# Patient Record
Sex: Male | Born: 1950 | Race: White | Hispanic: No | Smoking: Former smoker
Health system: Southern US, Community
[De-identification: ages and names within clinical notes are randomized; demographics above are authoritative.]

## PROBLEM LIST (undated history)

## (undated) DIAGNOSIS — E119 Type 2 diabetes mellitus without complications: Secondary | ICD-10-CM

## (undated) DIAGNOSIS — IMO0002 Reserved for concepts with insufficient information to code with codable children: Secondary | ICD-10-CM

## (undated) DIAGNOSIS — G4730 Sleep apnea, unspecified: Secondary | ICD-10-CM

## (undated) DIAGNOSIS — M519 Unspecified thoracic, thoracolumbar and lumbosacral intervertebral disc disorder: Secondary | ICD-10-CM

## (undated) DIAGNOSIS — M1990 Unspecified osteoarthritis, unspecified site: Secondary | ICD-10-CM

## (undated) DIAGNOSIS — E785 Hyperlipidemia, unspecified: Secondary | ICD-10-CM

## (undated) DIAGNOSIS — K219 Gastro-esophageal reflux disease without esophagitis: Secondary | ICD-10-CM

## (undated) DIAGNOSIS — T7840XA Allergy, unspecified, initial encounter: Secondary | ICD-10-CM

## (undated) DIAGNOSIS — C4490 Unspecified malignant neoplasm of skin, unspecified: Secondary | ICD-10-CM

## (undated) HISTORY — DX: Reserved for concepts with insufficient information to code with codable children: IMO0002

## (undated) HISTORY — DX: Unspecified malignant neoplasm of skin, unspecified: C44.90

## (undated) HISTORY — PX: COLONOSCOPY: SHX174

## (undated) HISTORY — DX: Type 2 diabetes mellitus without complications: E11.9

## (undated) HISTORY — DX: Allergy, unspecified, initial encounter: T78.40XA

## (undated) HISTORY — PX: UPPER GI ENDOSCOPY: SHX6162

## (undated) HISTORY — DX: Unspecified osteoarthritis, unspecified site: M19.90

## (undated) HISTORY — DX: Sleep apnea, unspecified: G47.30

## (undated) HISTORY — DX: Gastro-esophageal reflux disease without esophagitis: K21.9

## (undated) HISTORY — DX: Unspecified thoracic, thoracolumbar and lumbosacral intervertebral disc disorder: M51.9

## (undated) HISTORY — PX: APPENDECTOMY: SHX54

## (undated) HISTORY — PX: ORBITAL FRACTURE SURGERY: SHX725

## (undated) HISTORY — PX: FRACTURE SURGERY: SHX138

## (undated) HISTORY — DX: Hyperlipidemia, unspecified: E78.5

## (undated) HISTORY — PX: HERNIA REPAIR: SHX51

---

## 2004-12-21 ENCOUNTER — Ambulatory Visit (HOSPITAL_COMMUNITY): Admission: RE | Admit: 2004-12-21 | Discharge: 2004-12-21 | Payer: Self-pay | Admitting: Internal Medicine

## 2004-12-21 ENCOUNTER — Ambulatory Visit: Payer: Self-pay | Admitting: Internal Medicine

## 2005-04-01 ENCOUNTER — Encounter (INDEPENDENT_AMBULATORY_CARE_PROVIDER_SITE_OTHER): Payer: Self-pay | Admitting: Specialist

## 2005-04-01 ENCOUNTER — Inpatient Hospital Stay (HOSPITAL_COMMUNITY): Admission: EM | Admit: 2005-04-01 | Discharge: 2005-04-03 | Payer: Self-pay | Admitting: Emergency Medicine

## 2005-04-02 ENCOUNTER — Encounter (INDEPENDENT_AMBULATORY_CARE_PROVIDER_SITE_OTHER): Payer: Self-pay | Admitting: Specialist

## 2005-04-02 ENCOUNTER — Ambulatory Visit: Payer: Self-pay | Admitting: Internal Medicine

## 2005-04-18 ENCOUNTER — Ambulatory Visit: Payer: Self-pay | Admitting: Internal Medicine

## 2005-05-30 ENCOUNTER — Ambulatory Visit: Payer: Self-pay | Admitting: Internal Medicine

## 2008-12-14 ENCOUNTER — Ambulatory Visit: Payer: Self-pay | Admitting: Cardiology

## 2008-12-14 DIAGNOSIS — R0602 Shortness of breath: Secondary | ICD-10-CM

## 2008-12-14 DIAGNOSIS — R079 Chest pain, unspecified: Secondary | ICD-10-CM | POA: Insufficient documentation

## 2008-12-14 DIAGNOSIS — E1169 Type 2 diabetes mellitus with other specified complication: Secondary | ICD-10-CM | POA: Insufficient documentation

## 2008-12-14 DIAGNOSIS — E785 Hyperlipidemia, unspecified: Secondary | ICD-10-CM

## 2008-12-14 DIAGNOSIS — F17200 Nicotine dependence, unspecified, uncomplicated: Secondary | ICD-10-CM

## 2008-12-28 ENCOUNTER — Telehealth (INDEPENDENT_AMBULATORY_CARE_PROVIDER_SITE_OTHER): Payer: Self-pay

## 2008-12-29 ENCOUNTER — Ambulatory Visit: Payer: Self-pay

## 2008-12-29 ENCOUNTER — Ambulatory Visit: Payer: Self-pay | Admitting: Internal Medicine

## 2008-12-29 ENCOUNTER — Encounter: Payer: Self-pay | Admitting: Cardiology

## 2008-12-29 ENCOUNTER — Encounter (HOSPITAL_COMMUNITY): Admission: RE | Admit: 2008-12-29 | Discharge: 2009-02-09 | Payer: Self-pay | Admitting: Cardiology

## 2009-01-11 ENCOUNTER — Ambulatory Visit: Payer: Self-pay | Admitting: Cardiology

## 2009-01-19 ENCOUNTER — Telehealth: Payer: Self-pay | Admitting: Cardiology

## 2009-01-25 ENCOUNTER — Telehealth: Payer: Self-pay | Admitting: Cardiology

## 2009-01-27 ENCOUNTER — Encounter: Payer: Self-pay | Admitting: Cardiology

## 2009-02-06 ENCOUNTER — Ambulatory Visit: Payer: Self-pay | Admitting: Cardiology

## 2009-02-06 ENCOUNTER — Ambulatory Visit (HOSPITAL_COMMUNITY): Admission: RE | Admit: 2009-02-06 | Discharge: 2009-02-06 | Payer: Self-pay | Admitting: Cardiology

## 2009-02-08 ENCOUNTER — Ambulatory Visit: Payer: Self-pay | Admitting: Cardiology

## 2010-06-29 NOTE — H&P (Signed)
Paul Johnston, Paul Johnston                 ACCOUNT NO.:  [id number]   MEDICAL RECORD NO.:  [id number]          PATIENT TYPE:  INP   LOCATION:  1827                         FACILITY:  [facility]   PHYSICIAN:  Hettie Holstein, D.O.    DATE OF BIRTH:  Jul 26, 1950   DATE OF ADMISSION:  04/01/2005  DATE OF DISCHARGE:                                HISTORY & PHYSICAL   PRIMARY CARE PHYSICIAN:  Dr. Vernon Prey   HISTORY OF PRESENT ILLNESS:  Mr. Escamilla is a pleasant 60 year old Caucasian  male with rather unremarkable medical history with the exception to some  bleeding hemorrhoids that were noted on a colonoscopy performed by Dr.  Karilyn Cota back in November who had been in his usual state of health up until  this past Saturday while after eating a large meal at [place] he developed  some abdominal pain and subsequently some loose, dark stools, then had a  recurrence and worsening diarrhea with progressively darkening stool  followed by dark-colored emesis.  This has not recurred since he left his  home around 9 a.m. and upon arrival to be evaluated by his primary care  physician.  He was transferred to the emergency department and has not had a  recurrence of bloody emesis and currently appears to be hemodynamically  stable, though he is slightly tachycardic.  His BUN is elevated at 37.  His  hemoglobin is 14.7.   PAST MEDICAL HISTORY:  History of appendectomy.  He denies other history  including hypertension, coronary artery disease, or diabetes.  No kidney  problems that he is aware of.  Only takes medications for asthma at home,  Nasacort, Asmanex.   ALLERGIES:  CODEINE.   SOCIAL HISTORY:  The patient is about a one pack per day smoker since the  age of 60.  He denies alcohol.  He is an Chiropodist.  He is,  however, quite sedentary.  He drank alcohol occasionally when he was  younger, but not heavily according to him.   FAMILY HISTORY:  His mother is alive at age 43 with diagnosis of  cancer in  the 30s.  Was treated at the time.  Now has had lymphoma in remission.  Father died at age 58.  He had emphysema and congestive heart failure.  He  has two children and is married.   REVIEW OF SYSTEMS:  No chest pain, shortness of breath, nausea, vomiting  with the exception that described in the history of present illness starting  on Saturday.  No lower extremity swelling or dyspnea on exertion.  Further  review is unremarkable.   PHYSICAL EXAMINATION:  VITAL SIGNS:  Stable in the department.  Temperature  98.5, blood pressure 117/74, heart rate 106.  He was afebrile.  GENERAL:  He is alert and oriented x3.  He was hemoccult-positive.  LUNGS:  Clear bilaterally.  HEART:  Regular.  There was no appreciable murmur.  ABDOMEN:  Soft and nontender.  No palpable hepatosplenomegaly.  No rigidity.  EXTREMITIES:  Lower extremities reveal no edema.  NEUROLOGIC:  Euthymic.  His affect was stable.  LABORATORY DATA:  Sodium 136, potassium 3.7, BUN 37, creatinine 0.9, glucose  101, AST/ALT 20/15, albumin 3.2.  WBC 66, hemoglobin 14.7, platelet count  209, MCV 87.   ASSESSMENT:  Upper gastrointestinal bleed.  No recent history of NSAIDs or  aspirin.  Patient takes no prescription medications at home with the  exception of that for his asthma.   PLAN:  Will consult gastroenterology as he will likely need an upper  endoscopy.  Will type and cross.  Hold 2 units PRBCs to follow serial H&Hs  and follow him closely in the stepdown ICU with two large bore IV access.  Initiate IV Protonix q.12h. and anti-emetics.      Hettie Holstein, D.O.  Electronically Signed     ESS/MEDQ  D:  04/01/2005  T:  04/01/2005  Job:  [id number]   cc:   Ernestina Penna, M.D.  Fax: [phone]

## 2010-06-29 NOTE — Consult Note (Signed)
NAMEWHITTEN, Paul Johnston                 ACCOUNT NO.:  [id number]   MEDICAL RECORD NO.:  [id number]          PATIENT TYPE:  INP   LOCATION:  2904                         FACILITY:  [facility]   PHYSICIAN:  Charlaine Dalton. Sherene Sires, M.D. Romualdo Bolk OF BIRTH:  1950/02/13   DATE OF CONSULTATION:  04/02/2005  DATE OF DISCHARGE:                                   CONSULTATION   REQUESTED BY:  Dr. Garnette Czech.   HISTORY:  This is a 60 year old white male long-term smoker, who was  admitted for GI bleeding, and during workup noted to have an abnormal CT  scan and I was asked to see him.  He was actually seen by Dr. Christell Constant  approximately 3 weeks ago with the request that he give him something to  stop smoking and, during the workup, was seen by Dr. Lowanda Foster, who  diagnosed him with asthma and started him on Asmanex, but the patient says  he did not really notice any change.  His only symptoms are those of mostly  morning coughing productive of scant amounts of thick white mucus.  He  denies any dyspnea with activities of daily living and is not aerobically  active.  He does have chronic nasal obstructive symptoms that are a bit  worse in the spring, but no significant itching, sneezing or subjective  wheezing in the past.   PAST MEDICAL HISTORY:  He is status post appendectomy, but denies any  chronic illnesses.   ALLERGIES:  CODEINE.   SOCIAL HISTORY:  He has continued to smoke a pack per day.  He denies any  excessive alcohol use.   FAMILY HISTORY:  Positive for emphysema in his father, he was a smoker and  also had heart failure.  No family history of any atopy to his knowledge or  rheumatologic diseases.   REVIEW OF SYSTEMS:  Taken in detail from the patient directly and also from  the chart, and essentially negative except for GI complaints as outlined  above and he is feeling comfortable now.   PHYSICAL EXAMINATION:  VITAL SIGNS:  He is afebrile with normal vital signs.  GENERAL:  This is a pleasant  ambulatory white male in no acute distress.  HEENT:  Significant for moderate turbinate edema with no cyanosis, pallor or  polyps.  Oropharynx was clear.  NECK:  Supple without cervical adenopathy or tenderness.  Trachea is  midline.  No thyromegaly.  LUNG FIELDS:  A few inspiratory and expiratory rhonchi, but these are not  prominent.  HEART:  There is a regular rate and rhythm without murmur, gallop or rub  present.  ABDOMEN:  Soft and benign with no palpable organomegaly, mass or tenderness.  EXTREMITIES:  Warm without calf tenderness, cyanosis, clubbing or edema.   A CT scan was reviewed and reveals a minimal tree and bud appearance.  Hemoglobin saturations adequate on room air.  Lab studies for rheumatologic  disease are pending.   IMPRESSION:  Pulmonary infiltrates on CT scan are unlikely to actually be  present on chest x-ray, and represent nonspecific bronchiolitis that is  probably respiratory bronchiolitis and  interstitial lung disease associated  with smoking.  The only way to prove this, of course, would be to proceed  with a lung biopsy or have the patient stop smoking and see if all of his  problems resolve.  I did mention this to the patient and he is very anxious  to stop smoking, but on the other hand, seems to be in denial about the  major impact smoking has his health care cost, but he says the smoking  cessation medicines are too expensive and I cannot afford them through my  insurance.   At this point, however, he does not appear to have had any kind of response  to Asmanex (probably because he is still smoking) and therefore, I recommend  he stop it and substitute Combivent 2 puffs every 4 hours as needed for any  symptomatic need for either cough, wheezing or dyspnea, immediate control  and either plan to see me or Dr. Lowanda Foster back at 6 weeks for a follow up  evaluation.   The tree and bud appearance that is being described by radiologists more  often with  the high-resolution scans  is nonspecific and can be seen in  viral bronchitis, and smokers bronchitis and certainly of the  alveolitis/vasculitis syndromes associated with rheumatologic disease, which  does not particularly appear to apply here.   LABORATORY:  He had a white count of 14,700 with no significant eosinophils  yesterday, sed rate is pending, as is ANA and rheumatoid factor is less than  1-20.   MEDICATIONS TO THIS POINT:  1.  Albuterol and Atrovent as needed nebulizers.  2.  Protonix 40 mg twice daily.  3.  Multiple as needed medications.           ______________________________  Charlaine Dalton Sherene Sires, M.D. [facility]     MBW/MEDQ  D:  04/02/2005  T:  04/03/2005  Job:  [id number]   cc:   Ernestina Penna, M.D.  Fax: [phone]

## 2010-06-29 NOTE — Discharge Summary (Signed)
Paul Johnston, Paul Johnston                 ACCOUNT NO.:  [id number]   MEDICAL RECORD NO.:  [id number]          PATIENT TYPE:  INP   LOCATION:  2904                         FACILITY:  [facility]   PHYSICIAN:  Mobolaji B. Bakare, M.D.DATE OF BIRTH:  1950/07/17   DATE OF ADMISSION:  04/01/2005  DATE OF DISCHARGE:  04/03/2005                                 DISCHARGE SUMMARY   PRIMARY CARE PHYSICIAN:  [facility], Ernestina Penna, M.D.   FINAL DIAGNOSES:  1.  Upper gastrointestinal bleed from large esophageal ulcer on endoscopy.      Biopsy taken.  2.  Chronic obstructive pulmonary disease.  3.  Blood loss anemia.  4.  Tobacco abuse.  5.  A 6-mm left lower lobe nodule, recommend to followup CT chest in 6      months.   CONSULTATIONS:  1.  GI consult, Dr. Jolee Ewing Nat Mann/Dr. Elnoria Howard.  2.  Pulmonary consult, Dr. Sandrea Hughs.   PROCEDURES:  1.  CT scan of the chest done, on April 02, 2005, showed scattered      rhonchi opacities with tree-in-bud appearance compatible with small      vessel disease/alveolitis, 6-mm left lower lobe nodule.  2.  Abdominal CT scan:  No acute findings.  3.  Pelvic CT:  No acute findings.  4.  Upper endoscopy, done by Dr. Charna Elizabeth on the 28th of February 2007      with cold biopsies.    Findings:  Large necrotic esophageal ulcer seen in distal esophagus with  prominent margins. Large Hiatal hernia. Large amount of debris in stomach.   BRIEF HISTORY:  Paul Johnston is a 60 year old Caucasian male with unremarkable  medical history until recent presentation with  dark colored emesis and dark  stool.  On initial evaluation his BUN was 37.  Hemoglobin of 14.7.  He was  admitted for further evaluation.   HOSPITAL COURSE:  1.  GI bleed secondary to large esophageal ulcer.  GI was consulted on the      day of admission.  The patient went for an initial endoscopy on April 01, 2005 and it showed a large esophageal ulcer with prominent rolled up   margin.  The plan was to repeat endoscopy in the morning.  A repeat      endoscopy was done report  as noted above.  The patient's hemoglobin      dropped from 14 to 11.6 and he was transfused with 1 unit of packed red      blood cells.  The patient was symptomatically improved and felt stable      to be discharged to follow up with Dr. Loreta Ave in the office regarding      biopsy result.  2.  COPD.  Paul Johnston stated that he was recently diagnosed with asthma.  A CT      scan of the chest report was noted above and pulmonary was consulted.      Regarding the tree-in-bud appearance.  The feeling is that this is a  nonspecific description and it could be seen viral bronchitis and      smoker's bronchitis.  He has significant history of smoking and has      underlying COPD.  Recommendation is to discontinue Asthmanex and      substitute with Combivent 2 puffs q.4h. p.r.n. for symptomatic relief of      cough, wheezing, and dizziness.  There was a question of hospital      sinusitis.  It was recommended if symptoms persist to get a CT scan of      the sinuses.  3.  Tobacco abuse.  The patient has ongoing tobacco abuse.  He continues to      smoke one pack per day.  He was strongly advised on quitting smoking and      the patient seems to be in denial of the fact this will have no his      health but he did state he is willing to quit smoking.  He stated that      tobacco substitutes are too expensive and he could not afford it through      his insurance.  The patient was encouraged to quit smoking.  The noted      tree-in-bud appearance on chest CT may probably be related to      interstitial lung disease associated with smoking.  The patient does      quite smoking, then one would expect resolution of this finding on chest      imaging.  He would have a followup chest CT scan in 6 months and this      would help to clarify if indeed the patient stopped smoking and there is      resolution of  tree-in-bud appearance.   DISCHARGE CONDITION:  The patient was discharged home in the company of his  wife in a stable condition.   FOLLOWUP:  1.  Dr. Elnoria Howard  on 2/23/2007__________ .  2.  Dr. Sherene Sires or Dr. Lowanda Foster as needed.   DISCHARGE MEDICATIONS:  1.  Protonix 40 mg daily.  2.  Combivent 2 puffs four times a day.  3.  Mucinex 1,200 two times a day.  4.  Nasonex to use as before.  5.  Tylenol p.r.n.  6.  Asthmanex is to be discontinued.   RECOMMENDATIONS:  1.  Follow up biopsy result with Dr. Elnoria Howard.  2.  CT scan sinuses if symptoms persist.  3.  Follow up with Dr. Christell Constant in 1 to 2 weeks.  4.  Followup CT chest in 6 months (to follow up left lower lobe nodule.)      Mobolaji B. Corky Downs, M.D.  Electronically Signed     MBB/MEDQ  D:  04/16/2005  T:  04/16/2005  Job:  [id number]   cc:   Ernestina Penna, M.D.  Fax: [phone]

## 2010-06-29 NOTE — Op Note (Signed)
Paul Johnston, BUNTROCK                 ACCOUNT NO.:  [id number]   MEDICAL RECORD NO.:  [id number]          PATIENT TYPE:  AMB   LOCATION:  DAY                           FACILITY:  [facility]   PHYSICIAN:  Lionel December, M.D.    DATE OF BIRTH:  February 27, 1950   DATE OF PROCEDURE:  12/21/2004  DATE OF DISCHARGE:                                 OPERATIVE REPORT   PROCEDURE:  Colonoscopy.   INDICATIONS:  The patient is a 60 year old Caucasian male who is here for  screening colonoscopy. Family history is negative for colorectal carcinoma.  He has occasional hematochezia felt to be secondary to hemorrhoids.  Procedure and risks were reviewed with the patient, and informed consent was  obtained.   MEDICATIONS:  Medicines used for conscious sedation:  Demerol 25 mg IV,  Versed 4 mg IV.   FINDINGS:  Procedure performed in endoscopy suite. The patient's vital signs  and O2 saturation were monitored during the procedure and remained stable.  The patient was placed in left lateral position. Rectal examination  performed. No abnormality noted on external or digital exam. Olympus  videoscope was placed in rectum and advanced under vision into sigmoid colon  and beyond. Preparation was satisfactory. Small scattered diverticula noted  at the sigmoid colon and also hepatic flexure. Scope was passed into cecum  which was identified by appendiceal orifice and ileocecal valve. Pictures  taken for the record. As the scope was withdrawn, colonic mucosa was  examined once again, and no polyps and/or tumor masses were noted. Rectal  mucosa was normal. Scope was retroflexed to examine anorectal junction, and  he had hemorrhoids above and below the dentate line. Endoscope was  straightened and withdrawn. The patient tolerated the procedure well.   FINAL DIAGNOSIS:  1.  Few small diverticula at sigmoid colon and hepatic flexure.  2.  External/internal hemorrhoids.   RECOMMENDATIONS:  1.  High-fiber diet.  2.   Yearly Hemoccults.  3.  He may consider next screening exam in 10 years from now.      Lionel December, M.D.  Electronically Signed     NR/MEDQ  D:  12/21/2004  T:  12/21/2004  Job:  [id number]   cc:   Ernestina Penna, M.D.  Fax: [phone]

## 2010-06-29 NOTE — Consult Note (Signed)
Paul Johnston, Paul Johnston                 ACCOUNT NO.:  [id number]   MEDICAL RECORD NO.:  [id number]          PATIENT TYPE:  INP   LOCATION:  2904                         FACILITY:  [facility]   PHYSICIAN:  Anselmo Rod, M.D.  DATE OF BIRTH:  1950/09/05   DATE OF CONSULTATION:  04/01/2005  DATE OF DISCHARGE:                                   CONSULTATION   REASON FOR CONSULTATION:  Melena with coffee-grounds emesis, question  hematemesis.   ASSESSMENT:  1.  Coffee-grounds emesis, elevated BUN.  Rule out peptic ulcer disease,      Mallory-Weiss tear, gastritis, etc.  2.  Asthma.  3.  History of an appendectomy in the remote past.   RECOMMENDATIONS:  1.  EGD today.  2.  Agree with supportive care.  3.  Agree with IV PPIs for now.  4.  Type and cross as planned by Dr. Angelena Sole.   DISCUSSION:  Paul Johnston is a 60 year old white male who presented to  the [facility] emergency room today with a three-day history of nausea,  vomiting, black stool and black emesis.  He has had some streaks of fresh  blood in the vomitus.  He claims he was in his usual state of health until  about three days ago.  He returned from a [place] and about 11:30  at night, three hours after he ate his meal, he developed acute abdominal  pain followed by black stools and nausea and vomiting.  He had some  epigastric and periumbilical tenderness at that time.  His symptoms seemed  to continue, and therefore he and his wife came to the emergency room at  [facility] this morning.  He has had a fairly good appetite until three  days ago, when his symptoms started.  He denies having previous symptoms  with ulcer disease, esophagitis or gastritis.  He denies problems with frank  reflux.  He has maintained his weight.  There is no history of abnormal  weight loss or weight gain in the last few months.  His abdominal pain and  nausea and vomiting has improved since he came to the ER, as he received  some  Zofran and pain medications.  There is no family history of colon,  esophageal or stomach cancer.   PAST MEDICAL HISTORY:  See list above.   ALLERGIES:  CODEINE.   Medications at home are Nasonex and an inhaler.  He has received Zofran and  IV Protonix in the ER.   SOCIAL HISTORY:  He is married and has grown children.  He smoked a pack per  day for the last 30 years.  He drinks alcohol very rarely.  Denies the use  of illicit drugs or street drugs.  He is an Lexicographer.   FAMILY HISTORY:  His mother has a history of uterine cancer and has had non-  Hodgkin's lymphoma.  She is now in remission in her 33s.  There is no family  history of breast, ovarian, uterine, colon or endometrial cancer, and the  patient denies a family history of prostate cancer.  PHYSICAL EXAMINATION:  GENERAL:  A very pleasant, cooperative white male in  no acute distress.  VITAL SIGNS:  Stable vital signs, temperature 97.8, blood pressure 110/70,  pulse of 102-106 per minute.  NECK:  Supple.  CHEST:  Clear to auscultation.  CARDIAC:  S1, S2, regular, no murmur, rub or gallop, rhonchi or wheezing.  ABDOMEN:  Soft with no hepatosplenomegaly, no masses palpable.  Abdomen is  nontender with normal bowel sounds.  RECTAL:  Moderate sphincter tone with guaiac-positive black stools.   Laboratory evaluation on admission revealed a sodium of 136, potassium 3.7,  chloride 103, CO2 24, glucose 101, BUN 37, creatinine 0.9, calcium 8.4,  total protein 6.1, albumin 3.2, AST 20, ALT 15, alkaline phosphatase 36,  total bilirubin 0.5.  Hemoglobin was 14.7 with hematocrit 43.3, white blood  cell count 16.6, platelets 209,000, with 89% neutrophils.  PT is 13.9 with  INR of 1.1.   PLAN:  As above.  Further recommendations will be made in follow-up.      Anselmo Rod, M.D.  Electronically Signed     JNM/MEDQ  D:  04/02/2005  T:  04/03/2005  Job:  [id number]   cc:   Hettie Holstein, D.O.   Lionel December,  M.D.  [address]  [place]  [place] [address]

## 2010-06-29 NOTE — Op Note (Signed)
Paul Johnston, Paul Johnston                 ACCOUNT NO.:  [id number]   MEDICAL RECORD NO.:  [id number]          PATIENT TYPE:  INP   LOCATION:  2904                         FACILITY:  [facility]   PHYSICIAN:  Anselmo Rod, M.D.  DATE OF BIRTH:  [birth year]   DATE OF PROCEDURE:  04/02/2005  DATE OF DISCHARGE:                                 OPERATIVE REPORT   PROCEDURE PERFORMED:  Esophagogastroduodenoscopy with cold biopsies x1.   ENDOSCOPIST:  Charna Elizabeth, M.D.   INSTRUMENT USED:  Olympus video panendoscope.   INDICATIONS FOR PROCEDURE:  The patient is a 60 year old white male with a  history of asthma and a 3-day history of nausea, vomiting, abdominal pain,  melena and coffee ground emesis with some emesis of black debris.  Rule out  peptic ulcer disease, esophagitis, gastritis, etc.   PREPROCEDURE PREPARATION:  Informed consent was procured from the patient.  The patient was fasted for eight hours prior to the procedure.  The risks  and benefits of the procedure were discussed with the patient in great  detail.   PREPROCEDURE PHYSICAL:  The patient had stable vital signs, slightly  tachycardic at a pulse of 102 to 106 per minute.  Otherwise vital signs were  stable.  Neck supple, chest clear to auscultation.  S1, S2 regular.  Abdomen  soft with normal bowel sounds.  Minimal epigastric tenderness on palpation,  no guarding, no rebound, no rigidity, no hepatosplenomegaly.   DESCRIPTION OF PROCEDURE:  The patient was placed in the left lateral  decubitus position and sedated with 50 mcg of fentanyl and 5 mg of Versed in  slow incremental doses.  Once the patient was adequately sedated and  maintained on low-flow oxygen and continuous cardiac monitoring, the Olympus  video panendoscope was advanced through the mouth piece over the tongue into  the esophagus under direct vision.  A large esophageal ulcer was seen in the  distal esophagus above the Z-line.  This was elliptical in shape with  a  necrotic base.  It had thickened margins.  One biopsy was done from the  margin and there was easily bleeding.  There was a large amount of debris  that was regurgitating back into the esophagus causing the patient to cough  and become uncomfortable.  There was a large hiatal hernia seen on  retroflexion in the high cardia.  There was solid debris in the stomach that  was black in color and the patient had a long J-shaped stomach and his  proximal small bowel was not visualized.   IMPRESSION:  1.Large necrotic esophageal ulcer seen in the distal esophagus  with prominent margins biopsied x1.  2.Large hiatal hernia.  3.Large amount of debris in the stomach.   RECOMMENDATIONS:  1.Continue serial CBCs.  2.Agree with IV Protonix.  3.Repeat EGD in the a.m. for more biopsies and to finalize the diagnosis.  4.As discussed with Dr. Angelena Sole, a CT of the chest and abdomen will be  required once the biopsies have been done and the diagnosis established.      Anselmo Rod, M.D.  Electronically  Signed     JNM/MEDQ  D:  04/02/2005  T:  04/02/2005  Job:  [id number]   cc:   Hettie Holstein, D.O.   Lionel December, M.D.  [address]  [place]  [place] [address]

## 2011-12-06 ENCOUNTER — Telehealth: Payer: Self-pay | Admitting: Family Medicine

## 2011-12-06 NOTE — Telephone Encounter (Signed)
Patient's wife called emergency line because pt ran out of vicodin. States they are patient's at [facility] and see Dr. Tanya Nones. Patient was not in the office today, but were told vicodin would be called into [facility]. The rx isn't there though. I informed pt that I do not have access to records, and that phoned in narcotics are against our emergency line policy. I agreed to forward message to PCP. Advised them to call the clinic when it's open. He can use tylenol in the meantime.

## 2012-07-01 ENCOUNTER — Encounter (INDEPENDENT_AMBULATORY_CARE_PROVIDER_SITE_OTHER): Payer: Self-pay | Admitting: Surgery

## 2012-07-03 ENCOUNTER — Ambulatory Visit (INDEPENDENT_AMBULATORY_CARE_PROVIDER_SITE_OTHER): Payer: Self-pay | Admitting: Surgery

## 2012-07-03 ENCOUNTER — Encounter (INDEPENDENT_AMBULATORY_CARE_PROVIDER_SITE_OTHER): Payer: Self-pay | Admitting: Surgery

## 2012-07-03 ENCOUNTER — Ambulatory Visit (INDEPENDENT_AMBULATORY_CARE_PROVIDER_SITE_OTHER): Payer: BC Managed Care – PPO | Admitting: Surgery

## 2012-07-03 VITALS — BP 122/80 | HR 77 | Temp 96.8°F | Resp 12 | Ht 69.0 in | Wt 242.0 lb

## 2012-07-03 DIAGNOSIS — K429 Umbilical hernia without obstruction or gangrene: Secondary | ICD-10-CM

## 2012-07-03 NOTE — Progress Notes (Signed)
Patient ID: Paul Johnston, male   DOB: 11-Oct-1950, 62 y.o.   MRN: [id number]  Chief Complaint  Patient presents with  . Hernia    HPI ENRRIQUE Johnston is a 62 y.o. male.   HPI This is a pleasant gentleman referred by Dr. Elnoria Howard for evaluation of a symptomatically umbilical hernia. He noticed it several weeks ago during a coughing spell. He has discomfort as well as a bulge that he can reduce. He has no obstructive symptoms. The pain is mild in intensity. He has been told for many years that he has an asymptomatic right inguinal hernia. He has never noticed a bulge in the right groin Past Medical History  Diagnosis Date  . Hyperlipidemia   . Hypertension   . Allergy   . Arthritis   . GERD (gastroesophageal reflux disease)   . Ulcer     Past Surgical History  Procedure Laterality Date  . Appendectomy    . Orbital fracture surgery      History reviewed. No pertinent family history.  Social History History  Substance Use Topics  . Smoking status: Never Smoker   . Smokeless tobacco: Not on file  . Alcohol Use: No    Allergies  Allergen Reactions  . Codeine     Current Outpatient Prescriptions  Medication Sig Dispense Refill  . aspirin 81 MG tablet Take 81 mg by mouth daily.      . cholecalciferol (VITAMIN D) 1000 UNITS tablet Take 1,000 Units by mouth daily.      . Esomeprazole Magnesium (NEXIUM PO) Take by mouth.      . fish oil-omega-3 fatty acids 1000 MG capsule Take 2 g by mouth daily.      . Rosuvastatin Calcium (CRESTOR PO) Take by mouth.       No current facility-administered medications for this visit.    Review of Systems Review of Systems  Constitutional: Negative for fever, chills and unexpected weight change.  HENT: Negative for hearing loss, congestion, sore throat, trouble swallowing and voice change.   Eyes: Negative for visual disturbance.  Respiratory: Negative for cough and wheezing.   Cardiovascular: Negative for chest pain, palpitations and leg  swelling.  Gastrointestinal: Positive for abdominal pain. Negative for nausea, vomiting, diarrhea, constipation, blood in stool, abdominal distention, anal bleeding and rectal pain.  Genitourinary: Negative for hematuria and difficulty urinating.  Musculoskeletal: Negative for arthralgias.  Skin: Negative for rash and wound.  Neurological: Negative for seizures, syncope, weakness and headaches.  Hematological: Negative for adenopathy. Does not bruise/bleed easily.  Psychiatric/Behavioral: Negative for confusion.    Blood pressure 122/80, pulse 77, temperature 96.8 F (36 C), temperature source Temporal, resp. rate 12, height 5\' 9"  (1.753 m), weight 242 lb (109.77 kg).  Physical Exam Physical Exam  Constitutional: He is oriented to person, place, and time. No distress.  Morbidly obese  HENT:  Head: Normocephalic and atraumatic.  Right Ear: External ear normal.  Left Ear: External ear normal.  Nose: Nose normal.  Mouth/Throat: Oropharynx is clear and moist. No oropharyngeal exudate.  Eyes: Conjunctivae are normal. Pupils are equal, round, and reactive to light. Right eye exhibits no discharge. Left eye exhibits no discharge. No scleral icterus.  Neck: Normal range of motion. Neck supple. No tracheal deviation present. No thyromegaly present.  Cardiovascular: Normal rate, regular rhythm, normal heart sounds and intact distal pulses.   No murmur heard. Pulmonary/Chest: Effort normal and breath sounds normal. No respiratory distress. He has no wheezes. He has no rales.  Abdominal: Soft. Bowel sounds are normal. He exhibits no distension. There is no tenderness.  Obese  There is an easily reducible small umbilical hernia.  There is a very small right inguinal hernia  Musculoskeletal: Normal range of motion. He exhibits no edema and no tenderness.  Lymphadenopathy:    He has no cervical adenopathy.  Neurological: He is alert and oriented to person, place, and time.  Skin: Skin is warm  and dry. No rash noted. He is not diaphoretic. No erythema.  Psychiatric: His behavior is normal. Judgment normal.    Data Reviewed   Assessment    Umbilical hernia     Plan    As he is symptomatic, repair with possible mesh is recommended. I discussed this with him in detail. I discussed the risks of surgery which includes but is not limited to bleeding, infection, injury to any structures, recurrence, etc. As he is asymptomatic from the inguinal hernia he would like to hold all repair which I feel is reasonable. I did discuss the risk of incarceration with him. Surgery will be scheduled.        Kimbely Whiteaker A 07/03/2012, 11:57 AM

## 2012-07-08 ENCOUNTER — Telehealth (INDEPENDENT_AMBULATORY_CARE_PROVIDER_SITE_OTHER): Payer: Self-pay

## 2012-07-08 NOTE — Telephone Encounter (Signed)
Pt called wanting to know how his short term disability will work. Pt advised to bring forms in,pay fee and forms will be completed. Pt asked re: rtw dates. Pt advised he will need to know if employer will allow light duty. Pt advised he will not be able to do lifting for 4-6 wks but can rtw earlier if lifting,pushing or pulling is not required. Pt states he will talk to his employer.

## 2012-07-13 ENCOUNTER — Encounter (HOSPITAL_BASED_OUTPATIENT_CLINIC_OR_DEPARTMENT_OTHER): Payer: Self-pay | Admitting: *Deleted

## 2012-07-13 NOTE — Progress Notes (Signed)
07/13/12 1258  OBSTRUCTIVE SLEEP APNEA  Have you ever been diagnosed with sleep apnea through a sleep study? No  Do you snore loudly (loud enough to be heard through closed doors)?  1  Do you often feel tired, fatigued, or sleepy during the daytime? 0  Has anyone observed you stop breathing during your sleep? 0  Do you have, or are you being treated for high blood pressure? 0  BMI more than 35 kg/m2? 1  Age over 61 years old? 1  Gender: 1  Obstructive Sleep Apnea Score 4  Score 4 or greater  Results sent to PCP

## 2012-07-13 NOTE — Progress Notes (Signed)
No labd needed-evaluated with cp 2010-stress negative-has gerd-contolled allergies and asthma-not used inhalers in yrs-no labs needed

## 2012-07-15 NOTE — H&P (Signed)
Patient ID: Paul Johnston, male DOB: 01/27/1951, 62 y.o. MRN: [id number]  Chief Complaint   Patient presents with   .  Hernia   HPI  Paul Johnston is a 62 y.o. male.  HPI  This is a pleasant gentleman referred by Dr. Elnoria Howard for evaluation of a symptomatically umbilical hernia. He noticed it several weeks ago during a coughing spell. He has discomfort as well as a bulge that he can reduce. He has no obstructive symptoms. The pain is mild in intensity. He has been told for many years that he has an asymptomatic right inguinal hernia. He has never noticed a bulge in the right groin  Past Medical History   Diagnosis  Date   .  Hyperlipidemia    .  Hypertension    .  Allergy    .  Arthritis    .  GERD (gastroesophageal reflux disease)    .  Ulcer     Past Surgical History   Procedure  Laterality  Date   .  Appendectomy     .  Orbital fracture surgery     History reviewed. No pertinent family history.  Social History  History   Substance Use Topics   .  Smoking status:  Never Smoker   .  Smokeless tobacco:  Not on file   .  Alcohol Use:  No    Allergies   Allergen  Reactions   .  Codeine     Current Outpatient Prescriptions   Medication  Sig  Dispense  Refill   .  aspirin 81 MG tablet  Take 81 mg by mouth daily.     .  cholecalciferol (VITAMIN D) 1000 UNITS tablet  Take 1,000 Units by mouth daily.     .  Esomeprazole Magnesium (NEXIUM PO)  Take by mouth.     .  fish oil-omega-3 fatty acids 1000 MG capsule  Take 2 g by mouth daily.     .  Rosuvastatin Calcium (CRESTOR PO)  Take by mouth.      No current facility-administered medications for this visit.   Review of Systems  Review of Systems  Constitutional: Negative for fever, chills and unexpected weight change.  HENT: Negative for hearing loss, congestion, sore throat, trouble swallowing and voice change.  Eyes: Negative for visual disturbance.  Respiratory: Negative for cough and wheezing.  Cardiovascular: Negative for  chest pain, palpitations and leg swelling.  Gastrointestinal: Positive for abdominal pain. Negative for nausea, vomiting, diarrhea, constipation, blood in stool, abdominal distention, anal bleeding and rectal pain.  Genitourinary: Negative for hematuria and difficulty urinating.  Musculoskeletal: Negative for arthralgias.  Skin: Negative for rash and wound.  Neurological: Negative for seizures, syncope, weakness and headaches.  Hematological: Negative for adenopathy. Does not bruise/bleed easily.  Psychiatric/Behavioral: Negative for confusion.  Blood pressure 122/80, pulse 77, temperature 96.8 F (36 C), temperature source Temporal, resp. rate 12, height 5\' 9"  (1.753 m), weight 242 lb (109.77 kg).  Physical Exam  Physical Exam  Constitutional: He is oriented to person, place, and time. No distress.  Morbidly obese  HENT:  Head: Normocephalic and atraumatic.  Right Ear: External ear normal.  Left Ear: External ear normal.  Nose: Nose normal.  Mouth/Throat: Oropharynx is clear and moist. No oropharyngeal exudate.  Eyes: Conjunctivae are normal. Pupils are equal, round, and reactive to light. Right eye exhibits no discharge. Left eye exhibits no discharge. No scleral icterus.  Neck: Normal range of motion. Neck supple. No tracheal deviation  present. No thyromegaly present.  Cardiovascular: Normal rate, regular rhythm, normal heart sounds and intact distal pulses.  No murmur heard.  Pulmonary/Chest: Effort normal and breath sounds normal. No respiratory distress. He has no wheezes. He has no rales.  Abdominal: Soft. Bowel sounds are normal. He exhibits no distension. There is no tenderness.  Obese  There is an easily reducible small umbilical hernia.  There is a very small right inguinal hernia  Musculoskeletal: Normal range of motion. He exhibits no edema and no tenderness.  Lymphadenopathy:  He has no cervical adenopathy.  Neurological: He is alert and oriented to person, place, and  time.  Skin: Skin is warm and dry. No rash noted. He is not diaphoretic. No erythema.  Psychiatric: His behavior is normal. Judgment normal.  Data Reviewed  Assessment  Umbilical hernia  Plan  As he is symptomatic, repair with possible mesh is recommended. I discussed this with him in detail. I discussed the risks of surgery which includes but is not limited to bleeding, infection, injury to any structures, recurrence, etc. As he is asymptomatic from the inguinal hernia he would like to hold all repair which I feel is reasonable. I did discuss the risk of incarceration with him. Surgery will be scheduled.

## 2012-07-16 ENCOUNTER — Ambulatory Visit (HOSPITAL_BASED_OUTPATIENT_CLINIC_OR_DEPARTMENT_OTHER)
Admission: RE | Admit: 2012-07-16 | Discharge: 2012-07-16 | Disposition: A | Discharge status: Home / Self Care | Payer: BC Managed Care – PPO | Source: Ambulatory Visit | Attending: Surgery | Admitting: Surgery

## 2012-07-16 ENCOUNTER — Encounter (HOSPITAL_BASED_OUTPATIENT_CLINIC_OR_DEPARTMENT_OTHER): Payer: Self-pay | Admitting: Anesthesiology

## 2012-07-16 ENCOUNTER — Encounter (HOSPITAL_BASED_OUTPATIENT_CLINIC_OR_DEPARTMENT_OTHER)
Admission: RE | Disposition: A | Discharge status: Home / Self Care | Payer: Self-pay | Source: Ambulatory Visit | Attending: Surgery

## 2012-07-16 ENCOUNTER — Ambulatory Visit (HOSPITAL_BASED_OUTPATIENT_CLINIC_OR_DEPARTMENT_OTHER): Payer: BC Managed Care – PPO | Admitting: Anesthesiology

## 2012-07-16 ENCOUNTER — Encounter (HOSPITAL_BASED_OUTPATIENT_CLINIC_OR_DEPARTMENT_OTHER): Payer: Self-pay

## 2012-07-16 DIAGNOSIS — M129 Arthropathy, unspecified: Secondary | ICD-10-CM | POA: Insufficient documentation

## 2012-07-16 DIAGNOSIS — Z6835 Body mass index (BMI) 35.0-35.9, adult: Secondary | ICD-10-CM | POA: Insufficient documentation

## 2012-07-16 DIAGNOSIS — E785 Hyperlipidemia, unspecified: Secondary | ICD-10-CM | POA: Insufficient documentation

## 2012-07-16 DIAGNOSIS — Z79899 Other long term (current) drug therapy: Secondary | ICD-10-CM | POA: Insufficient documentation

## 2012-07-16 DIAGNOSIS — J309 Allergic rhinitis, unspecified: Secondary | ICD-10-CM | POA: Insufficient documentation

## 2012-07-16 DIAGNOSIS — Z885 Allergy status to narcotic agent status: Secondary | ICD-10-CM | POA: Insufficient documentation

## 2012-07-16 DIAGNOSIS — Z7982 Long term (current) use of aspirin: Secondary | ICD-10-CM | POA: Insufficient documentation

## 2012-07-16 DIAGNOSIS — K429 Umbilical hernia without obstruction or gangrene: Secondary | ICD-10-CM | POA: Insufficient documentation

## 2012-07-16 DIAGNOSIS — K219 Gastro-esophageal reflux disease without esophagitis: Secondary | ICD-10-CM | POA: Insufficient documentation

## 2012-07-16 DIAGNOSIS — K4090 Unilateral inguinal hernia, without obstruction or gangrene, not specified as recurrent: Secondary | ICD-10-CM | POA: Insufficient documentation

## 2012-07-16 DIAGNOSIS — I10 Essential (primary) hypertension: Secondary | ICD-10-CM | POA: Insufficient documentation

## 2012-07-16 HISTORY — PX: INSERTION OF MESH: SHX5868

## 2012-07-16 HISTORY — PX: UMBILICAL HERNIA REPAIR: SHX196

## 2012-07-16 SURGERY — REPAIR, HERNIA, UMBILICAL, ADULT
Anesthesia: General | Site: Abdomen | Wound class: Clean

## 2012-07-16 MED ORDER — CEFAZOLIN SODIUM-DEXTROSE 2-3 GM-% IV SOLR
2.0000 g | INTRAVENOUS | Status: AC
  Administered 2012-07-16: 2 g via INTRAVENOUS

## 2012-07-16 MED ORDER — LACTATED RINGERS IV SOLN
INTRAVENOUS | Status: DC
  Administered 2012-07-16 (×2): via INTRAVENOUS

## 2012-07-16 MED ORDER — PROPOFOL 10 MG/ML IV BOLUS
INTRAVENOUS | Status: DC | PRN
  Administered 2012-07-16: 300 mg via INTRAVENOUS

## 2012-07-16 MED ORDER — ONDANSETRON HCL 4 MG PO TABS: 4.0000 mg | ORAL_TABLET | Freq: Four times a day (QID) | ORAL | Status: DC | PRN

## 2012-07-16 MED ORDER — HYDROCODONE-ACETAMINOPHEN 5-325 MG PO TABS: 1.0000 | ORAL_TABLET | ORAL | Status: DC | PRN

## 2012-07-16 MED ORDER — ONDANSETRON HCL 4 MG/2ML IJ SOLN: 4.0000 mg | Freq: Once | INTRAMUSCULAR | Status: DC | PRN

## 2012-07-16 MED ORDER — OXYCODONE HCL 5 MG/5ML PO SOLN: 5.0000 mg | Freq: Once | ORAL | Status: DC | PRN

## 2012-07-16 MED ORDER — MIDAZOLAM HCL 5 MG/5ML IJ SOLN
INTRAMUSCULAR | Status: DC | PRN
  Administered 2012-07-16: 2 mg via INTRAVENOUS

## 2012-07-16 MED ORDER — FENTANYL CITRATE 0.05 MG/ML IJ SOLN: 50.0000 ug | INTRAMUSCULAR | Status: DC | PRN

## 2012-07-16 MED ORDER — DEXAMETHASONE SODIUM PHOSPHATE 4 MG/ML IJ SOLN
INTRAMUSCULAR | Status: DC | PRN
  Administered 2012-07-16: 10 mg via INTRAVENOUS

## 2012-07-16 MED ORDER — OXYCODONE HCL 5 MG PO TABS: 5.0000 mg | ORAL_TABLET | Freq: Once | ORAL | Status: DC | PRN

## 2012-07-16 MED ORDER — HYDROMORPHONE HCL PF 1 MG/ML IJ SOLN
0.2500 mg | INTRAMUSCULAR | Status: DC | PRN
  Administered 2012-07-16: 0.5 mg via INTRAVENOUS

## 2012-07-16 MED ORDER — MEPERIDINE HCL 25 MG/ML IJ SOLN: 6.2500 mg | INTRAMUSCULAR | Status: DC | PRN

## 2012-07-16 MED ORDER — FENTANYL CITRATE 0.05 MG/ML IJ SOLN
INTRAMUSCULAR | Status: DC | PRN
  Administered 2012-07-16 (×2): 50 ug via INTRAVENOUS

## 2012-07-16 MED ORDER — KETOROLAC TROMETHAMINE 30 MG/ML IJ SOLN
INTRAMUSCULAR | Status: DC | PRN
  Administered 2012-07-16: 30 mg via INTRAVENOUS

## 2012-07-16 MED ORDER — MIDAZOLAM HCL 2 MG/2ML IJ SOLN: 1.0000 mg | INTRAMUSCULAR | Status: DC | PRN

## 2012-07-16 MED ORDER — LIDOCAINE HCL (CARDIAC) 20 MG/ML IV SOLN
INTRAVENOUS | Status: DC | PRN
  Administered 2012-07-16: 50 mg via INTRAVENOUS

## 2012-07-16 MED ORDER — ONDANSETRON HCL 4 MG/2ML IJ SOLN
INTRAMUSCULAR | Status: DC | PRN
  Administered 2012-07-16: 4 mg via INTRAVENOUS

## 2012-07-16 MED ORDER — BUPIVACAINE-EPINEPHRINE 0.5% -1:200000 IJ SOLN
INTRAMUSCULAR | Status: DC | PRN
  Administered 2012-07-16: 20 mL

## 2012-07-16 SURGICAL SUPPLY — 49 items
APL SKNCLS STERI-STRIP NONHPOA (GAUZE/BANDAGES/DRESSINGS) ×1
BENZOIN TINCTURE PRP APPL 2/3 (GAUZE/BANDAGES/DRESSINGS) ×2 IMPLANT
BLADE SURG 15 STRL LF DISP TIS (BLADE) ×1 IMPLANT
BLADE SURG 15 STRL SS (BLADE) ×4
BLADE SURG ROTATE 9660 (MISCELLANEOUS) ×1 IMPLANT
CANISTER SUCTION 1200CC (MISCELLANEOUS) IMPLANT
CHLORAPREP W/TINT 26ML (MISCELLANEOUS) ×2 IMPLANT
CLEANER CAUTERY TIP 5X5 PAD (MISCELLANEOUS) ×1 IMPLANT
CLOTH BEACON ORANGE TIMEOUT ST (SAFETY) ×2 IMPLANT
COVER MAYO STAND STRL (DRAPES) ×2 IMPLANT
COVER SURGICAL LIGHT HANDLE (MISCELLANEOUS) ×1 IMPLANT
COVER TABLE BACK 60X90 (DRAPES) ×2 IMPLANT
DECANTER SPIKE VIAL GLASS SM (MISCELLANEOUS) IMPLANT
DRAPE PED LAPAROTOMY (DRAPES) ×2 IMPLANT
DRAPE UTILITY XL STRL (DRAPES) ×2 IMPLANT
DRSG TEGADERM 2-3/8X2-3/4 SM (GAUZE/BANDAGES/DRESSINGS) IMPLANT
DRSG TEGADERM 4X4.75 (GAUZE/BANDAGES/DRESSINGS) ×2 IMPLANT
ELECT REM PT RETURN 9FT ADLT (ELECTROSURGICAL) ×2
ELECTRODE REM PT RTRN 9FT ADLT (ELECTROSURGICAL) ×1 IMPLANT
GLOVE BIO SURGEON STRL SZ7 (GLOVE) ×1 IMPLANT
GLOVE BIOGEL PI IND STRL 7.0 (GLOVE) IMPLANT
GLOVE BIOGEL PI IND STRL 7.5 (GLOVE) IMPLANT
GLOVE BIOGEL PI INDICATOR 7.0 (GLOVE) ×1
GLOVE BIOGEL PI INDICATOR 7.5 (GLOVE) ×1
GLOVE SURG SIGNA 7.5 PF LTX (GLOVE) ×2 IMPLANT
GLOVE SURG SS PI 7.5 STRL IVOR (GLOVE) ×1 IMPLANT
GOWN PREVENTION PLUS XLARGE (GOWN DISPOSABLE) ×2 IMPLANT
GOWN PREVENTION PLUS XXLARGE (GOWN DISPOSABLE) ×3 IMPLANT
NDL HYPO 25X1 1.5 SAFETY (NEEDLE) ×1 IMPLANT
NEEDLE HYPO 25X1 1.5 SAFETY (NEEDLE) ×2 IMPLANT
NS IRRIG 1000ML POUR BTL (IV SOLUTION) IMPLANT
PACK BASIN DAY SURGERY FS (CUSTOM PROCEDURE TRAY) ×2 IMPLANT
PAD CLEANER CAUTERY TIP 5X5 (MISCELLANEOUS) ×1
PATCH VENTRAL SMALL 4.3 (Mesh Specialty) ×1 IMPLANT
PENCIL BUTTON HOLSTER BLD 10FT (ELECTRODE) ×2 IMPLANT
SLEEVE SCD COMPRESS KNEE MED (MISCELLANEOUS) ×2 IMPLANT
SPONGE LAP 4X18 X RAY DECT (DISPOSABLE) IMPLANT
STRIP CLOSURE SKIN 1/2X4 (GAUZE/BANDAGES/DRESSINGS) ×2 IMPLANT
SUT MNCRL AB 4-0 PS2 18 (SUTURE) ×2 IMPLANT
SUT NOVA NAB DX-16 0-1 5-0 T12 (SUTURE) ×3 IMPLANT
SUT VIC AB 2-0 SH 27 (SUTURE)
SUT VIC AB 2-0 SH 27XBRD (SUTURE) IMPLANT
SUT VIC AB 3-0 SH 27 (SUTURE) ×2
SUT VIC AB 3-0 SH 27X BRD (SUTURE) ×2 IMPLANT
SYR CONTROL 10ML LL (SYRINGE) ×2 IMPLANT
TOWEL OR 17X24 6PK STRL BLUE (TOWEL DISPOSABLE) ×2 IMPLANT
TOWEL OR NON WOVEN STRL DISP B (DISPOSABLE) ×1 IMPLANT
TUBE CONNECTING 20X1/4 (TUBING) ×1 IMPLANT
YANKAUER SUCT BULB TIP NO VENT (SUCTIONS) ×1 IMPLANT

## 2012-07-16 NOTE — Anesthesia Postprocedure Evaluation (Signed)
Anesthesia Post Note  Patient: Paul Johnston  Procedure(s) Performed: Procedure(s) (LRB): HERNIA REPAIR UMBILICAL WITH MESH (N/A) INSERTION OF MESH (N/A)  Anesthesia type: general  Patient location: PACU  Post pain: Pain level controlled  Post assessment: Patient's Cardiovascular Status Stable  Last Vitals:  Filed Vitals:   07/16/12 1301  BP: 107/68  Pulse: 63  Temp: 36.5 C  Resp: 16    Post vital signs: Reviewed and stable  Level of consciousness: sedated  Complications: No apparent anesthesia complications

## 2012-07-16 NOTE — Transfer of Care (Signed)
Immediate Anesthesia Transfer of Care Note  Patient: Paul Johnston  Procedure(s) Performed: Procedure(s): HERNIA REPAIR UMBILICAL WITH MESH (N/A) INSERTION OF MESH (N/A)  Patient Location: PACU  Anesthesia Type:General  Level of Consciousness: sedated  Airway & Oxygen Therapy: Patient Spontanous Breathing and Patient connected to face mask oxygen  Post-op Assessment: Report given to PACU RN and Post -op Vital signs reviewed and stable  Post vital signs: Reviewed and stable  Complications: No apparent anesthesia complications

## 2012-07-16 NOTE — Anesthesia Preprocedure Evaluation (Signed)
Anesthesia Evaluation  Patient identified by MRN, date of birth, ID band Patient awake    Reviewed: Allergy & Precautions, H&P , NPO status , Patient's Chart, lab work & pertinent test results  Airway Mallampati: II TM Distance: >3 FB Neck ROM: Full    Dental   Pulmonary          Cardiovascular hypertension, Pt. on medications     Neuro/Psych    GI/Hepatic GERD-  Medicated and Controlled,  Endo/Other    Renal/GU      Musculoskeletal   Abdominal   Peds  Hematology   Anesthesia Other Findings   Reproductive/Obstetrics                           Anesthesia Physical Anesthesia Plan  ASA: III  Anesthesia Plan: General   Post-op Pain Management:    Induction: Intravenous  Airway Management Planned: LMA  Additional Equipment:   Intra-op Plan:   Post-operative Plan: Extubation in OR  Informed Consent:   Plan Discussed with: CRNA and Surgeon  Anesthesia Plan Comments:         Anesthesia Quick Evaluation

## 2012-07-16 NOTE — Op Note (Signed)
HERNIA REPAIR UMBILICAL WITH MESH, INSERTION OF MESH  Procedure Note  Paul Johnston 07/16/2012   Pre-op Diagnosis: umbilical hernia     Post-op Diagnosis: same  Procedure(s): HERNIA REPAIR UMBILICAL WITH MESH INSERTION OF MESH (4cm round ventral patch)  Surgeon(s): Shelly Rubenstein, MD  Anesthesia: General  Staff:  Circulator: Vernie Ammons Bouchillon, RN Scrub Person: Donald Pore, CST  Estimated Blood Loss: Minimal             Procedure: The patient was brought to the operating room and identified as the correct patient. He was placed supine on the operating room table and general anesthesia was induced. His abdomen was then prepped and draped in the usual sterile fashion. I anesthetized the skin with Marcaine. I then made a small transverse incision just above the umbilicus. I took this down through the subcutaneous tissue with electrocautery. I then separated a small needle the hernia sac from the overlying umbilical skin and excised the sac in its entirety. A 4 cm round ventral patch from [employer] was brought to the field. I passed it through the fascial opening and then pulled it up with the ties against the peritoneal surface. I sutured the mesh in place with #1 Novafil sutures. I then cut the ties and closed the fascia over the top of the mesh with a figure-of-eight #1 Novafil suture. I anesthetized the fashion further with Marcaine. I tag and local skin back in place with a 2-0 Vicryl suture. I then closed the subcutaneous tissue with interrupted 3-0 Vicryl sutures and closed the skin with a running 4-0 Monocryl. Steri-Strips, gauze, and Tegaderm were applied. The patient tolerated the procedure well. All counts were correct at the end of the procedure. The patient was then Extubated the operating room and taken in stable condition to the recovery room.       Paul Johnston A   Date: 07/16/2012  Time: 10:55 AM

## 2012-07-16 NOTE — Interval H&P Note (Signed)
History and Physical Interval Note: no change in H and P  07/16/2012 9:12 AM  Paul Johnston  has presented today for surgery, with the diagnosis of umbilical hernia  The various methods of treatment have been discussed with the patient and family. After consideration of risks, benefits and other options for treatment, the patient has consented to  Procedure(s): HERNIA REPAIR UMBILICAL WITH MESH (N/A) INSERTION OF MESH (N/A) as a surgical intervention .  The patient's history has been reviewed, patient examined, no change in status, stable for surgery.  I have reviewed the patient's chart and labs.  Questions were answered to the patient's satisfaction.     [name] A

## 2012-07-17 ENCOUNTER — Encounter (HOSPITAL_BASED_OUTPATIENT_CLINIC_OR_DEPARTMENT_OTHER): Payer: Self-pay | Admitting: Surgery

## 2012-07-24 ENCOUNTER — Telehealth (INDEPENDENT_AMBULATORY_CARE_PROVIDER_SITE_OTHER): Payer: Self-pay

## 2012-07-24 ENCOUNTER — Encounter (INDEPENDENT_AMBULATORY_CARE_PROVIDER_SITE_OTHER): Payer: Self-pay | Admitting: General Surgery

## 2012-07-24 ENCOUNTER — Ambulatory Visit (INDEPENDENT_AMBULATORY_CARE_PROVIDER_SITE_OTHER): Payer: BC Managed Care – PPO | Admitting: General Surgery

## 2012-07-24 VITALS — BP 138/80 | HR 78 | Temp 97.8°F | Resp 18 | Ht 69.0 in | Wt 233.0 lb

## 2012-07-24 DIAGNOSIS — Z09 Encounter for follow-up examination after completed treatment for conditions other than malignant neoplasm: Secondary | ICD-10-CM

## 2012-07-24 NOTE — Patient Instructions (Addendum)
Call for increasing redness or fever or pain or increasing drainage as needed and otherwise keep her regular appointment Wash the area daily in the shower, pat dry and cover with clean gauze

## 2012-07-24 NOTE — Progress Notes (Signed)
Patient is approximately one week post umbilical hernia repair. He has noticed a couple of days of what he describes as seroussanguinous drainage and was concerned and came to the urgent office. He has mild improving discomfort.  Exam: BP 138/80  Pulse 78  Temp(Src) 97.8 F (36.6 C) (Temporal)  Resp 18  Ht 5\' 9"  (1.753 m)  Wt 233 lb (105.688 kg)  BMI 34.39 kg/m2 The incisions shows a very tiny scab or eschar in the midpoint. No drainage currently. No swelling. There is minimal erythema of the skin edges but no cellulitis or evidence of infection.  Assessment and plan: Probable seroma drained spontaneously through the incision. I do not see complication or infection. He will keep area clean and the shower and cover with dry gauze. He will notify us for any worsening symptoms and keep his current appointment.

## 2012-07-24 NOTE — Telephone Encounter (Signed)
Patient states he had serous drainage from Umb. Hernia incision area last few days but now it is brownish color . When ask if he had a temp he states he thinks so he did not feel well all week but did not take his temp. He also states the incision area is warm. I ask if it was red ? He stated he could not see into his belly button he did not know but the area was very warm to the touch. He was scheduled in Urgent ov with Dr. Johna Sheriff today @3 :30

## 2012-08-12 ENCOUNTER — Encounter (INDEPENDENT_AMBULATORY_CARE_PROVIDER_SITE_OTHER): Payer: Self-pay | Admitting: Surgery

## 2012-08-12 ENCOUNTER — Telehealth: Payer: Self-pay | Admitting: Family Medicine

## 2012-08-12 ENCOUNTER — Encounter (INDEPENDENT_AMBULATORY_CARE_PROVIDER_SITE_OTHER): Payer: Self-pay | Admitting: General Surgery

## 2012-08-12 ENCOUNTER — Ambulatory Visit (INDEPENDENT_AMBULATORY_CARE_PROVIDER_SITE_OTHER): Payer: BC Managed Care – PPO | Admitting: Surgery

## 2012-08-12 VITALS — BP 130/76 | HR 64 | Resp 16 | Ht 69.0 in | Wt 235.8 lb

## 2012-08-12 DIAGNOSIS — Z09 Encounter for follow-up examination after completed treatment for conditions other than malignant neoplasm: Secondary | ICD-10-CM

## 2012-08-12 NOTE — Progress Notes (Signed)
Subjective:     Patient ID: Paul Johnston, male   DOB: September 15, 1950, 62 y.o.   MRN: [id number]  HPI He is here for his first postop visit status post umbilical hernia repair with mesh. He is doing well has no problems  Review of Systems     Objective:   Physical Exam On exam, the incision is well healed except for one small area which is slightly opened up there is no evidence of infection    Assessment:     Patient stable postop     Plan:     We'll be resuming normal activity on July 17 and return to work at that time. I will see him back as needed

## 2012-08-17 ENCOUNTER — Other Ambulatory Visit: Payer: Self-pay | Admitting: *Deleted

## 2012-08-17 MED ORDER — ROSUVASTATIN CALCIUM 40 MG PO TABS: 40.0000 mg | ORAL_TABLET | Freq: Every day | ORAL | Status: DC

## 2012-08-17 NOTE — Telephone Encounter (Signed)
Please refill his Crestor for a couple months and give him a time download way for me to see him in [place] if he really wants to come here.

## 2012-08-17 NOTE — Telephone Encounter (Signed)
Refilled Crestor for mail order per pt request and appt given for chronic follow up

## 2012-08-17 NOTE — Telephone Encounter (Signed)
Patient wants to start coming here and seeing [facility]. Has seen him at [facility] in past. Please advise and let pt know. States that he has enough crestor to last another week

## 2012-08-24 ENCOUNTER — Encounter: Payer: Self-pay | Admitting: *Deleted

## 2012-08-31 ENCOUNTER — Ambulatory Visit: Payer: Self-pay | Admitting: Family Medicine

## 2012-09-01 ENCOUNTER — Encounter: Payer: Self-pay | Admitting: Family Medicine

## 2012-09-01 ENCOUNTER — Ambulatory Visit (INDEPENDENT_AMBULATORY_CARE_PROVIDER_SITE_OTHER): Payer: BC Managed Care – PPO | Admitting: Family Medicine

## 2012-09-01 VITALS — BP 119/66 | HR 80 | Temp 97.7°F | Ht 67.5 in | Wt 242.0 lb

## 2012-09-01 DIAGNOSIS — E559 Vitamin D deficiency, unspecified: Secondary | ICD-10-CM

## 2012-09-01 DIAGNOSIS — K219 Gastro-esophageal reflux disease without esophagitis: Secondary | ICD-10-CM

## 2012-09-01 DIAGNOSIS — N400 Enlarged prostate without lower urinary tract symptoms: Secondary | ICD-10-CM

## 2012-09-01 DIAGNOSIS — R0602 Shortness of breath: Secondary | ICD-10-CM

## 2012-09-01 DIAGNOSIS — E785 Hyperlipidemia, unspecified: Secondary | ICD-10-CM

## 2012-09-01 DIAGNOSIS — R5383 Other fatigue: Secondary | ICD-10-CM

## 2012-09-01 DIAGNOSIS — R911 Solitary pulmonary nodule: Secondary | ICD-10-CM | POA: Insufficient documentation

## 2012-09-01 NOTE — Patient Instructions (Addendum)
Fall precautions discussed Continue current meds and therapeutic lifestyle changes Continue medications as needed for allergic rhinitis

## 2012-09-01 NOTE — Progress Notes (Signed)
  Subjective:    Patient ID: Paul Johnston, male    DOB: [birth year], 62 y.o.   MRN: [id number]  HPI This patient comes to the clinic today to reestablish care. I last saw him in October 2011. He has a history of hyperlipidemia gastroesophageal reflux and vitamin D deficiency. He has seasonal allergies and currently they're under good control. 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   Review of Systems  Constitutional: Positive for appetite change (increased craving for sugar) and fatigue.  HENT: Positive for congestion (intermitent). Negative for ear pain, sore throat and postnasal drip.   Eyes: Positive for itching (burning and stinging intermitently). Negative for discharge and redness.  Respiratory: Positive for shortness of breath (with exertion).   Cardiovascular: Negative.   Gastrointestinal: Negative.   Endocrine: Negative.   Genitourinary: Positive for dysuria (intermitent, slight). Negative for hematuria.  Musculoskeletal: Positive for back pain (cervical), joint swelling (occasionally with heat) and arthralgias (bilateral knees, shoulders).  Allergic/Immunologic: Positive for environmental allergies (seasonal).  Neurological: Positive for numbness (bilateral arms, positional). Negative for dizziness and headaches.  Psychiatric/Behavioral: Positive for sleep disturbance (wakes to urinate).       Objective:   Physical Exam  Vitals reviewed. Constitutional: He is oriented to person, place, and time. He appears well-developed and well-nourished. No distress.  HENT:  Head: Normocephalic and atraumatic.  Right Ear: External ear normal.  Left Ear: External ear normal.  Mouth/Throat: Oropharynx is clear and moist. No oropharyngeal exudate.  Nasal congestion bilaterally  Eyes: Conjunctivae are normal. Right eye exhibits no discharge. Left eye exhibits no discharge. No scleral icterus.  Neck: Normal range of motion. Neck supple. No thyromegaly  present.  Cardiovascular: Normal rate and regular rhythm.  Exam reveals no gallop and no friction rub.   No murmur heard. Pulmonary/Chest: Effort normal and breath sounds normal. No respiratory distress. He has no wheezes. He has no rales.  Abdominal: Soft. He exhibits no distension and no mass. There is no tenderness. There is no rebound and no guarding.  Obesity   Musculoskeletal: Normal range of motion. He exhibits no edema.  Lymphadenopathy:    He has no cervical adenopathy.  Neurological: He is alert and oriented to person, place, and time. He has normal reflexes.  Skin: Skin is warm and dry. No rash noted. He is not diaphoretic. No erythema. No pallor.  Right forehead lesion quarter in size scaling flat  Psychiatric: He has a normal mood and affect. His behavior is normal. Judgment and thought content normal.          Assessment & Plan:  1. Hyperlipemia - NMR Lipoprofile with Lipids; Standing - Hepatic function panel; Standing  2. GERD (gastroesophageal reflux disease) Continue Nexium and Zantac as needed  3. Vitamin D deficiency - Vitamin D 25 hydroxy; Standing  4. Fatigue - POCT CBC; Standing - BASIC METABOLIC PANEL WITH GFR; Standing - Thyroid Panel With TSH  5. BPH (benign prostatic hypertrophy) - PSA - POCT UA - Microscopic Only - POCT urinalysis dipstick  6. DYSPNEA  7. Solitary pulmonary nodule, history of, left side -Will review CT scans and make sure that no further evaluation is necessary  8. BPH (benign prostatic hyperplasia)  Patient Instructions  Fall precautions discussed Continue current meds and therapeutic lifestyle changes Continue medications as needed for allergic rhinitis    Nyra Capes MD

## 2012-09-08 ENCOUNTER — Other Ambulatory Visit: Payer: BC Managed Care – PPO

## 2012-09-08 DIAGNOSIS — Z1212 Encounter for screening for malignant neoplasm of rectum: Secondary | ICD-10-CM

## 2012-09-09 LAB — FECAL OCCULT BLOOD, IMMUNOCHEMICAL: Fecal Occult Blood: NEGATIVE

## 2012-09-16 ENCOUNTER — Encounter: Payer: Self-pay | Admitting: *Deleted

## 2012-11-18 ENCOUNTER — Ambulatory Visit (INDEPENDENT_AMBULATORY_CARE_PROVIDER_SITE_OTHER): Payer: BC Managed Care – PPO | Admitting: General Practice

## 2012-11-18 ENCOUNTER — Encounter: Payer: Self-pay | Admitting: General Practice

## 2012-11-18 VITALS — BP 134/80 | HR 67 | Temp 97.0°F | Ht 67.5 in | Wt 245.0 lb

## 2012-11-18 DIAGNOSIS — M545 Low back pain: Secondary | ICD-10-CM

## 2012-11-18 DIAGNOSIS — R5383 Other fatigue: Secondary | ICD-10-CM

## 2012-11-18 DIAGNOSIS — E785 Hyperlipidemia, unspecified: Secondary | ICD-10-CM

## 2012-11-18 DIAGNOSIS — R5381 Other malaise: Secondary | ICD-10-CM

## 2012-11-18 DIAGNOSIS — Z23 Encounter for immunization: Secondary | ICD-10-CM

## 2012-11-18 DIAGNOSIS — E559 Vitamin D deficiency, unspecified: Secondary | ICD-10-CM

## 2012-11-18 LAB — POCT CBC
Granulocyte percent: 68.4 %G (ref 37–80)
HCT, POC: 44.9 % (ref 43.5–53.7)
Lymph, poc: 2.6 (ref 0.6–3.4)
MCHC: 32.5 g/dL (ref 31.8–35.4)
MPV: 8 fL (ref 0–99.8)
POC Granulocyte: 6.7 (ref 2–6.9)
POC LYMPH PERCENT: 26.3 %L (ref 10–50)
Platelet Count, POC: 193 10*3/uL (ref 142–424)
RDW, POC: 13.5 %
WBC: 9.8 10*3/uL (ref 4.6–10.2)

## 2012-11-18 NOTE — Progress Notes (Signed)
Subjective:    Patient ID: Lyn Records, male    DOB: Jul 10, 1950, 62 y.o.   MRN: [id number]  Back Pain This is a chronic problem. The current episode started in the past 7 days. The problem occurs constantly. The problem has been gradually improving since onset. The pain is present in the lumbar spine. The quality of the pain is described as aching. The pain does not radiate. The pain is at a severity of 4/10. The pain is the same all the time. The symptoms are aggravated by bending and twisting. Pertinent negatives include no abdominal pain, bladder incontinence, bowel incontinence, chest pain, fever, leg pain, numbness, tingling or weakness. He has tried nothing for the symptoms.  Reports being seen by Dr. Shelle Iron in [place] and diagnosed with ruptured disc more than one year ago. He denies being seen by him since then. He reports that he would like some pain medication from this office. He reports being unsure of exact name of medication that was given to him by another provider, but remembers is was 7.5mg . He denies taking any OTC medications.  Patient also reports he is due for labs.    Review of Systems  Constitutional: Negative for fever and chills.  HENT: Negative for ear pain.   Respiratory: Negative for chest tightness and shortness of breath.   Cardiovascular: Negative for chest pain and palpitations.  Gastrointestinal: Negative for abdominal pain, diarrhea, constipation, blood in stool and bowel incontinence.  Genitourinary: Negative for bladder incontinence, hematuria and difficulty urinating.  Musculoskeletal: Positive for back pain.       Low back pain  Neurological: Negative for dizziness, tingling, weakness and numbness.       Objective:   Physical Exam  Constitutional: He is oriented to person, place, and time. He appears well-developed and well-nourished.  HENT:  Head: Normocephalic and atraumatic.  Right Ear: External ear normal.  Left Ear: External ear normal.   Mouth/Throat: Oropharynx is clear and moist.  Eyes: Conjunctivae and EOM are normal. Pupils are equal, round, and reactive to light.  Neck: Normal range of motion. Neck supple. No thyromegaly present.  Cardiovascular: Normal rate, regular rhythm and normal heart sounds.   Pulmonary/Chest: Effort normal and breath sounds normal.  Abdominal: Soft. Bowel sounds are normal. He exhibits no distension. There is no tenderness.  Musculoskeletal: He exhibits tenderness. He exhibits no edema.  Tenderness noted with palpation in lumbar region. Negative for edema, redness, or warmth.   Lymphadenopathy:    He has no cervical adenopathy.  Neurological: He is alert and oriented to person, place, and time.  Skin: Skin is warm and dry.  Psychiatric: He has a normal mood and affect.          Assessment & Plan:  1. Fatigue  - POCT CBC - BASIC METABOLIC PANEL WITH GFR - POCT glycosylated hemoglobin (Hb A1C)  2. Hyperlipemia  - NMR Lipoprofile with Lipids - Hepatic function panel  3. Vitamin D deficiency  - Vitamin D 25 hydroxy  4. Need for prophylactic vaccination and inoculation against influenza   5. Tenderness of lumbar region -Patient declined xray of lumbar region (reported MRI had been performed by ortho specialist more than 1 year ago) -Patient decline treatment offered (depo-medrol IM injection, prednisone dose pack, naproxen, and flexeril) to he manage is discomfort -Patient verbalized he wanted the 7.5mg  of narcotic -Discussed following up with Dr. Shelle Iron and patient verbalized there was no need at this -Patient to seek emergency medical treatment if symptoms worsen -  will refer to Dr. Shelle Iron if patient request -Patient verbalized understanding -Coralie Keens, FNP-C

## 2012-11-19 LAB — HEPATIC FUNCTION PANEL
ALT: 13 U/L (ref 0–53)
Albumin: 4 g/dL (ref 3.5–5.2)
Indirect Bilirubin: 0.6 mg/dL (ref 0.0–0.9)
Total Protein: 6.9 g/dL (ref 6.0–8.3)

## 2012-11-19 LAB — NMR LIPOPROFILE WITH LIPIDS
HDL Size: 9.9 nm (ref >=9.2)
LDL (calc): 44 mg/dL (ref <100)
LDL Particle Number: 699 nmol/L (ref <1000)
LDL Size: 19.7 nm — ABNORMAL LOW (ref >20.5)
LP-IR Score: 33 (ref <=45)
Large VLDL-P: 1.4 nmol/L (ref <=2.7)
Small LDL Particle Number: 477 nmol/L (ref <=527)
VLDL Size: 44.7 nm (ref <=46.6)

## 2012-11-19 LAB — BASIC METABOLIC PANEL WITH GFR
BUN: 14 mg/dL (ref 6–23)
Creat: 0.82 mg/dL (ref 0.50–1.35)
GFR, Est African American: >89 mL/min
GFR, Est Non African American: >89 mL/min
Glucose, Bld: 91 mg/dL (ref 70–99)

## 2012-11-19 NOTE — Patient Instructions (Signed)
Back Pain, Adult  Low back pain is very common. About 1 in 5 people have back pain. The cause of low back pain is rarely dangerous. The pain often gets better over time. About half of people with a sudden onset of back pain feel better in just 2 weeks. About 8 in 10 people feel better by 6 weeks.   CAUSES  Some common causes of back pain include:  · Strain of the muscles or ligaments supporting the spine.  · Wear and tear (degeneration) of the spinal discs.  · Arthritis.  · Direct injury to the back.  DIAGNOSIS  Most of the time, the direct cause of low back pain is not known. However, back pain can be treated effectively even when the exact cause of the pain is unknown. Answering your caregiver's questions about your overall health and symptoms is one of the most accurate ways to make sure the cause of your pain is not dangerous. If your caregiver needs more information, he or she may order lab work or imaging tests (X-rays or MRIs). However, even if imaging tests show changes in your back, this usually does not require surgery.  HOME CARE INSTRUCTIONS  For many people, back pain returns. Since low back pain is rarely dangerous, it is often a condition that people can learn to manage on their own.   · Remain active. It is stressful on the back to sit or stand in one place. Do not sit, drive, or stand in one place for more than 30 minutes at a time. Take short walks on level surfaces as soon as pain allows. Try to increase the length of time you walk each day.  · Do not stay in bed. Resting more than 1 or 2 days can delay your recovery.  · Do not avoid exercise or work. Your body is made to move. It is not dangerous to be active, even though your back may hurt. Your back will likely heal faster if you return to being active before your pain is gone.  · Pay attention to your body when you  bend and lift. Many people have less discomfort when lifting if they bend their knees, keep the load close to their bodies, and  avoid twisting. Often, the most comfortable positions are those that put less stress on your recovering back.  · Find a comfortable position to sleep. Use a firm mattress and lie on your side with your knees slightly bent. If you lie on your back, put a pillow under your knees.  · Only take over-the-counter or prescription medicines as directed by your caregiver. Over-the-counter medicines to reduce pain and inflammation are often the most helpful. Your caregiver may prescribe muscle relaxant drugs. These medicines help dull your pain so you can more quickly return to your normal activities and healthy exercise.  · Put ice on the injured area.  · Put ice in a plastic bag.  · Place a towel between your skin and the bag.  · Leave the ice on for 15-20 minutes, 3-4 times a day for the first 2 to 3 days. After that, ice and heat may be alternated to reduce pain and spasms.  · Ask your caregiver about trying back exercises and gentle massage. This may be of some benefit.  · Avoid feeling anxious or stressed. Stress increases muscle tension and can worsen back pain. It is important to recognize when you are anxious or stressed and learn ways to manage it. Exercise is a great option.  SEEK MEDICAL CARE IF:  · You have pain that is not relieved with rest or   medicine.  · You have pain that does not improve in 1 week.  · You have new symptoms.  · You are generally not feeling well.  SEEK IMMEDIATE MEDICAL CARE IF:   · You have pain that radiates from your back into your legs.  · You develop new bowel or bladder control problems.  · You have unusual weakness or numbness in your arms or legs.  · You develop nausea or vomiting.  · You develop abdominal pain.  · You feel faint.  Document Released: 01/28/2005 Document Revised: 07/30/2011 Document Reviewed: 06/18/2010  Exit[employer]® Patient Information ©2014 [employer].

## 2012-11-19 NOTE — Addendum Note (Signed)
Addended byPhilomena Doheny E on: 11/19/2012 08:31 AM   Modules accepted: Level of Service

## 2012-12-02 ENCOUNTER — Ambulatory Visit (INDEPENDENT_AMBULATORY_CARE_PROVIDER_SITE_OTHER): Payer: BC Managed Care – PPO

## 2012-12-02 ENCOUNTER — Ambulatory Visit (INDEPENDENT_AMBULATORY_CARE_PROVIDER_SITE_OTHER): Payer: BC Managed Care – PPO | Admitting: Family Medicine

## 2012-12-02 ENCOUNTER — Encounter: Payer: Self-pay | Admitting: Family Medicine

## 2012-12-02 VITALS — BP 124/75 | HR 68 | Temp 98.2°F | Ht 67.5 in | Wt 245.0 lb

## 2012-12-02 DIAGNOSIS — R0789 Other chest pain: Secondary | ICD-10-CM

## 2012-12-02 DIAGNOSIS — E559 Vitamin D deficiency, unspecified: Secondary | ICD-10-CM

## 2012-12-02 DIAGNOSIS — Z0000 Encounter for general adult medical examination without abnormal findings: Secondary | ICD-10-CM

## 2012-12-02 DIAGNOSIS — K219 Gastro-esophageal reflux disease without esophagitis: Secondary | ICD-10-CM

## 2012-12-02 DIAGNOSIS — N400 Enlarged prostate without lower urinary tract symptoms: Secondary | ICD-10-CM

## 2012-12-02 DIAGNOSIS — E669 Obesity, unspecified: Secondary | ICD-10-CM

## 2012-12-02 DIAGNOSIS — Z23 Encounter for immunization: Secondary | ICD-10-CM

## 2012-12-02 DIAGNOSIS — E785 Hyperlipidemia, unspecified: Secondary | ICD-10-CM

## 2012-12-02 DIAGNOSIS — R079 Chest pain, unspecified: Secondary | ICD-10-CM

## 2012-12-02 NOTE — Progress Notes (Signed)
prevnar given and patient tolerated

## 2012-12-02 NOTE — Progress Notes (Signed)
Subjective:    Patient ID: Paul Johnston, male    DOB: 1950/11/01, 62 y.o.   MRN: [id number]  HPI Patient here today for annual physical. Patient has been doing well. He has gained a lot of weight since he stopped smoking. He did have an episode of back pain because of bulging discs and took some Advil and this helped. He also describes an episode of feeling tight in his chest that occurred not related to activity. This resolved and he did not pursue any further workup.    Patient Active Problem List   Diagnosis Date Noted  . GERD (gastroesophageal reflux disease) 09/01/2012  . Vitamin D deficiency 09/01/2012  . Solitary pulmonary nodule, history of, left side 09/01/2012  . BPH (benign prostatic hyperplasia) 09/01/2012  . Umbilical hernia 07/03/2012  . Hyperlipidemia 12/14/2008  . SMOKER 12/14/2008   Outpatient Encounter Prescriptions as of 12/02/2012  Medication Sig Dispense Refill  . aspirin 81 MG tablet Take 81 mg by mouth daily.      . cholecalciferol (VITAMIN D) 1000 UNITS tablet Take 1,000 Units by mouth daily.      . Esomeprazole Magnesium (NEXIUM PO) Take by mouth every evening.       . fish oil-omega-3 fatty acids 1000 MG capsule Take 2 g by mouth daily.      . ranitidine (ZANTAC) 150 MG tablet Take 150 mg by mouth as needed for heartburn.      . rosuvastatin (CRESTOR) 40 MG tablet Take 20 mg by mouth daily.       No facility-administered encounter medications on file as of 12/02/2012.    Review of Systems  Constitutional: Positive for fatigue.  HENT: Negative.   Eyes: Negative.   Respiratory: Negative.   Cardiovascular: Negative.   Gastrointestinal: Negative.   Endocrine: Negative.   Genitourinary: Negative.   Musculoskeletal: Positive for myalgias.  Skin: Negative.   Allergic/Immunologic: Negative.   Neurological: Positive for weakness.  Hematological: Negative.   Psychiatric/Behavioral: Negative.        Objective:   Physical Exam  Nursing note and vitals  reviewed. Constitutional: He is oriented to person, place, and time. He appears well-developed and well-nourished.  HENT:  Head: Normocephalic and atraumatic.  Right Ear: External ear normal.  Left Ear: External ear normal.  Nose: Nose normal.  Mouth/Throat: Oropharynx is clear and moist. No oropharyngeal exudate.  Eyes: Conjunctivae and EOM are normal. Right eye exhibits no discharge. Left eye exhibits no discharge. No scleral icterus.  Neck: Normal range of motion. Neck supple. No thyromegaly present.  Cardiovascular: Normal rate, regular rhythm, normal heart sounds and intact distal pulses.  Exam reveals no gallop and no friction rub.   No murmur heard. At 72 per minute  Pulmonary/Chest: Effort normal and breath sounds normal. No respiratory distress. He has no wheezes. He has no rales. He exhibits no tenderness.  Abdominal: Soft. Bowel sounds are normal. He exhibits no distension and no mass. There is no tenderness. There is no rebound and no guarding.  Obesity  Genitourinary: Rectum normal and penis normal.  Prostate was slightly enlarged. There were no masses or nodules. There was no inguinal hernia.  Musculoskeletal: Normal range of motion. He exhibits no edema and no tenderness.  Lymphadenopathy:    He has no cervical adenopathy.  Neurological: He is alert and oriented to person, place, and time. He has normal reflexes. No cranial nerve deficit.  Skin: Skin is warm and dry. No rash noted. No erythema. No pallor.  Psychiatric: He  has a normal mood and affect. His behavior is normal. Judgment and thought content normal.   BP 124/75  Pulse 68  Temp(Src) 98.2 F (36.8 C) (Oral)  Ht 5' 7.5" (1.715 m)  Wt 245 lb (111.131 kg)  BMI 37.78 kg/m2  EKG: Within normal limits, normal sinus rhythm [facility] reading (PRIMARY) by  Dr.Mikenzie Mccannon: Chest x-ray: No significant abnormality                                     Assessment & Plan:   1. Annual physical exam   2. Hyperlipemia   3.  Vitamin D deficiency   4. GERD (gastroesophageal reflux disease)   5. Chest tightness   6. Obesity   7. BPH (benign prostatic hyperplasia)    Orders Placed This Encounter  Procedures  . DG Chest 2 View    Standing Status: Future     Number of Occurrences:      Standing Expiration Date: 02/01/2014    Order Specific Question:  Reason for Exam (SYMPTOM  OR DIAGNOSIS REQUIRED)    Answer:  annual physical    Order Specific Question:  Preferred imaging location?    Answer:  Internal  . EKG 12-Lead   Current Outpatient Prescriptions on File Prior to Visit  Medication Sig Dispense Refill  . aspirin 81 MG tablet Take 81 mg by mouth daily.      . cholecalciferol (VITAMIN D) 1000 UNITS tablet Take 1,000 Units by mouth daily.      . Esomeprazole Magnesium (NEXIUM PO) Take by mouth every evening.       . fish oil-omega-3 fatty acids 1000 MG capsule Take 2 g by mouth daily.      . ranitidine (ZANTAC) 150 MG tablet Take 150 mg by mouth as needed for heartburn.      . rosuvastatin (CRESTOR) 40 MG tablet Take 20 mg by mouth daily.       No current facility-administered medications on file prior to visit.   Patient Instructions  Continue current medications. Continue good therapeutic lifestyle changes.  Fall precautions discussed with patient. Follow up as planned and earlier as needed.  To catch up on your immunization you will need a shingle shot and a Prevnar. Continue to watch your weight and do all you can exercise-wise and diet wise to keep that under control Take ibuprofen as needed for back pain, if this continues to cause a lot of problems for you you may want to followup with the orthopedist again. We will arrange a visit for you with the cardiologist to evaluate this episode of chest pain and tightness    Nyra Capes MD

## 2012-12-02 NOTE — Patient Instructions (Addendum)
Continue current medications. Continue good therapeutic lifestyle changes.  Fall precautions discussed with patient. Follow up as planned and earlier as needed.  To catch up on your immunization you will need a shingle shot and a Prevnar. Continue to watch your weight and do all you can exercise-wise and diet wise to keep that under control Take ibuprofen as needed for back pain, if this continues to cause a lot of problems for you you may want to followup with the orthopedist again. We will arrange a visit for you with the cardiologist to evaluate this episode of chest pain and tightness Pneumococcal Vaccine, Polyvalent suspension for injection What is this medicine? PNEUMOCOCCAL VACCINE, POLYVALENT (NEU mo KOK al vak SEEN, pol ee VEY luhnt) is a vaccine to prevent pneumococcus bacteria infection. These bacteria are a major cause of ear infections, 'Strep throat' infections, and serious pneumonia, meningitis, or blood infections worldwide. These vaccines help the body to produce antibodies (protective substances) that help your body defend against these bacteria. This vaccine is recommended for infants and young children. This vaccine will not treat an infection. This medicine may be used for other purposes; ask your health care provider or pharmacist if you have questions. What should I tell my health care provider before I take this medicine? They need to know if you have any of these conditions: -bleeding problems -fever -immune system problems -low platelet count in the blood -seizures -an unusual or allergic reaction to pneumococcal vaccine, diphtheria toxoid, other vaccines, latex, other medicines, foods, dyes, or preservatives -pregnant or trying to get pregnant -breast-feeding How should I use this medicine? This vaccine is for injection into a muscle. It is given by a health care professional. A copy of Vaccine Information Statements will be given before each vaccination. Read this  sheet carefully each time. The sheet may change frequently. Talk to your pediatrician regarding the use of this medicine in children. While this drug may be prescribed for children as young as 49 weeks old for selected conditions, precautions do apply. Overdosage: If you think you have taken too much of this medicine contact a poison control center or emergency room at once. NOTE: This medicine is only for you. Do not share this medicine with others. What if I miss a dose? It is important not to miss your dose. Call your doctor or health care professional if you are unable to keep an appointment. What may interact with this medicine? -medicines for cancer chemotherapy -medicines that suppress your immune function -medicines that treat or prevent blood clots like warfarin, enoxaparin, and dalteparin -steroid medicines like prednisone or cortisone This list may not describe all possible interactions. Give your health care provider a list of all the medicines, herbs, non-prescription drugs, or dietary supplements you use. Also tell them if you smoke, drink alcohol, or use illegal drugs. Some items may interact with your medicine. What should I watch for while using this medicine? Mild fever and pain should go away in 3 days or less. Report any unusual symptoms to your doctor or health care professional. What side effects may I notice from receiving this medicine? Side effects that you should report to your doctor or health care professional as soon as possible: -allergic reactions like skin rash, itching or hives, swelling of the face, lips, or tongue -breathing problems -confused -fever over 102 degrees F -pain, tingling, numbness in the hands or feet -seizures -unusual bleeding or bruising -unusual muscle weakness Side effects that usually do not require medical attention (  report to your doctor or health care professional if they continue or are bothersome): -aches and pains -diarrhea -fever  of 102 degrees F or less -headache -irritable -loss of appetite -pain, tender at site where injected -trouble sleeping This list may not describe all possible side effects. Call your doctor for medical advice about side effects. You may report side effects to FDA at 1-800-FDA-1088. Where should I keep my medicine? This does not apply. This vaccine is given in a clinic, pharmacy, doctor's office, or other health care setting and will not be stored at home. NOTE: This sheet is a summary. It may not cover all possible information. If you have questions about this medicine, talk to your doctor, pharmacist, or health care provider.  2013, [employer]vier/Gold Standard. (04/12/2008 10:17:22 AM)

## 2012-12-03 ENCOUNTER — Encounter: Payer: Self-pay | Admitting: *Deleted

## 2012-12-03 ENCOUNTER — Other Ambulatory Visit: Payer: Self-pay | Admitting: *Deleted

## 2012-12-03 DIAGNOSIS — R0789 Other chest pain: Secondary | ICD-10-CM

## 2012-12-24 ENCOUNTER — Ambulatory Visit (INDEPENDENT_AMBULATORY_CARE_PROVIDER_SITE_OTHER): Payer: BC Managed Care – PPO | Admitting: Cardiology

## 2012-12-24 ENCOUNTER — Encounter: Payer: Self-pay | Admitting: Cardiology

## 2012-12-24 VITALS — BP 110/68 | HR 96 | Ht 69.0 in | Wt 247.0 lb

## 2012-12-24 DIAGNOSIS — E785 Hyperlipidemia, unspecified: Secondary | ICD-10-CM

## 2012-12-24 NOTE — Patient Instructions (Signed)
The current medical regimen is effective;  continue present plan and medications.  Follow up as needed 

## 2012-12-24 NOTE — Progress Notes (Signed)
HPI The patient presents for evaluation of chest discomfort. He has no prior cardiac history. However, he did have coronary CT angiography in 2010. He had some mild soft plaque but no obstructive lesions. He recently told Dr. Christell Constant about some chest discomfort. This happened about 2 months ago. It happened as an isolated episode. It was at rest. It was mid discomfort without radiation. There were no associated symptoms. He has not had any of this since then. He says he's quite vigorously active at work and he does not bring on any of the symptoms. He denies any shortness of breath, PND or orthopnea. He has no palpitations, presyncope or syncope. He's had no edema. Over the years his gained 60 pounds as he stop smoking.  Allergies  Allergen Reactions  . Codeine   . Biaxin [Clarithromycin] Other (See Comments)    GI upset  . Mucinex [Guaifenesin Er] Nausea And Vomiting    Current Outpatient Prescriptions  Medication Sig Dispense Refill  . aspirin 81 MG tablet Take 81 mg by mouth daily.      . cholecalciferol (VITAMIN D) 1000 UNITS tablet Take 1,000 Units by mouth daily.      . Esomeprazole Magnesium (NEXIUM PO) Take by mouth every evening.       . fish oil-omega-3 fatty acids 1000 MG capsule Take 2 g by mouth daily.      . ranitidine (ZANTAC) 150 MG tablet Take 150 mg by mouth as needed for heartburn.      . rosuvastatin (CRESTOR) 40 MG tablet Take 20 mg by mouth daily.       No current facility-administered medications for this visit.    Past Medical History  Diagnosis Date  . Hyperlipidemia   . Hypertension   . Allergy   . Arthritis   . GERD (gastroesophageal reflux disease)   . Ulcer     Past Surgical History  Procedure Laterality Date  . Appendectomy    . Orbital fracture surgery    . Colonoscopy    . Upper gi endoscopy    . Umbilical hernia repair N/A 07/16/2012    Procedure: HERNIA REPAIR UMBILICAL WITH MESH;  Surgeon: Shelly Rubenstein, MD;  Location: [facility];  Service: General;  Laterality: N/A;  . Insertion of mesh N/A 07/16/2012    Procedure: INSERTION OF MESH;  Surgeon: Shelly Rubenstein, MD;  Location: [facility];  Service: General;  Laterality: N/A;    No family history on file.  History   Social History  . Marital Status: Married    Spouse Name: N/A    Number of Children: N/A  . Years of Education: N/A   Occupational History  . Not on file.   Social History Main Topics  . Smoking status: Former Smoker    Types: Cigarettes    Start date: 02/12/2008  . Smokeless tobacco: Not on file  . Alcohol Use: No  . Drug Use: No  . Sexual Activity: Not on file   Other Topics Concern  . Not on file   Social History Narrative  . No narrative on file    ROS:  As stated in the HPI and negative for all other systems.  PHYSICAL EXAM BP 110/68  Pulse 96  Ht 5\' 9"  (1.753 m)  Wt 247 lb (112.038 kg)  BMI 36.46 kg/m2 GENERAL:  Well appearing HEENT:  Pupils equal round and reactive, fundi not visualized, oral mucosa unremarkable NECK:  No jugular venous distention, waveform  within normal limits, carotid upstroke brisk and symmetric, no bruits, no thyromegaly LYMPHATICS:  No cervical, inguinal adenopathy LUNGS:  Clear to auscultation bilaterally BACK:  No CVA tenderness CHEST:  Unremarkable HEART:  PMI not displaced or sustained,S1 and S2 within normal limits, no S3, no S4, no clicks, no rubs, no murmurs ABD:  Flat, positive bowel sounds normal in frequency in pitch, no bruits, no rebound, no guarding, no midline pulsatile mass, no hepatomegaly, no splenomegaly EXT:  2 plus pulses throughout, no edema, no cyanosis no clubbing SKIN:  No rashes no nodules NEURO:  Cranial nerves II through XII grossly intact, motor grossly intact throughout PSYCH:  Cognitively intact, oriented to person place and time   EKG:  Sinus rhythm, rate 66, axis within normal limits, early transition in lead V2, no acute ST-T wave  changes.12/02/12  ASSESSMENT AND PLAN  CHEST PAIN:  I think the pretest probability of obstructive coronary disease is very low. I do not think further cardiovascular testing is indicated. He certainly should have primary risk reduction however. However, should he have any recurrent symptoms I would have a low threshold for the stress testing and he and I discussed this.  OVERWEIGHT:  The patient understands the need to lose weight with diet and exercise. We have discussed specific strategies for this.

## 2013-03-05 ENCOUNTER — Other Ambulatory Visit: Payer: Self-pay | Admitting: Family Medicine

## 2013-03-11 ENCOUNTER — Telehealth: Payer: Self-pay | Admitting: Family Medicine

## 2013-03-15 MED ORDER — ESOMEPRAZOLE MAGNESIUM 40 MG PO CPDR: 40.0000 mg | DELAYED_RELEASE_CAPSULE | Freq: Every day | ORAL | Status: DC

## 2013-03-15 MED ORDER — ROSUVASTATIN CALCIUM 40 MG PO TABS: 20.0000 mg | ORAL_TABLET | Freq: Every day | ORAL | Status: DC

## 2013-03-15 NOTE — Telephone Encounter (Signed)
done

## 2013-05-24 ENCOUNTER — Other Ambulatory Visit: Payer: Self-pay | Admitting: Family Medicine

## 2013-06-02 ENCOUNTER — Ambulatory Visit (INDEPENDENT_AMBULATORY_CARE_PROVIDER_SITE_OTHER): Payer: BC Managed Care – PPO | Admitting: Family Medicine

## 2013-06-02 ENCOUNTER — Encounter: Payer: Self-pay | Admitting: Family Medicine

## 2013-06-02 VITALS — BP 134/70 | HR 67 | Temp 97.8°F | Ht 69.0 in | Wt 258.0 lb

## 2013-06-02 DIAGNOSIS — E785 Hyperlipidemia, unspecified: Secondary | ICD-10-CM

## 2013-06-02 DIAGNOSIS — D485 Neoplasm of uncertain behavior of skin: Secondary | ICD-10-CM

## 2013-06-02 DIAGNOSIS — E559 Vitamin D deficiency, unspecified: Secondary | ICD-10-CM

## 2013-06-02 DIAGNOSIS — M5136 Other intervertebral disc degeneration, lumbar region: Secondary | ICD-10-CM

## 2013-06-02 DIAGNOSIS — L0291 Cutaneous abscess, unspecified: Secondary | ICD-10-CM

## 2013-06-02 DIAGNOSIS — K219 Gastro-esophageal reflux disease without esophagitis: Secondary | ICD-10-CM

## 2013-06-02 DIAGNOSIS — R5381 Other malaise: Secondary | ICD-10-CM

## 2013-06-02 DIAGNOSIS — N400 Enlarged prostate without lower urinary tract symptoms: Secondary | ICD-10-CM

## 2013-06-02 DIAGNOSIS — L0390 Cellulitis, unspecified: Secondary | ICD-10-CM

## 2013-06-02 DIAGNOSIS — R5383 Other fatigue: Secondary | ICD-10-CM

## 2013-06-02 DIAGNOSIS — M5137 Other intervertebral disc degeneration, lumbosacral region: Secondary | ICD-10-CM

## 2013-06-02 LAB — BASIC METABOLIC PANEL WITH GFR
BUN: 18 mg/dL (ref 6–23)
CALCIUM: 9.3 mg/dL (ref 8.4–10.5)
CHLORIDE: 100 meq/L (ref 96–112)
CO2: 30 mEq/L (ref 19–32)
CREATININE: 0.93 mg/dL (ref 0.50–1.35)
GFR, EST NON AFRICAN AMERICAN: 88 mL/min
Glucose, Bld: 99 mg/dL (ref 70–99)
Potassium: 4.8 mEq/L (ref 3.5–5.3)
Sodium: 138 mEq/L (ref 135–145)

## 2013-06-02 LAB — LIPID PANEL
CHOL/HDL RATIO: 2.7 ratio
CHOLESTEROL: 105 mg/dL (ref 0–200)
HDL: 39 mg/dL — AB (ref >39)
LDL Cholesterol: 54 mg/dL (ref 0–99)
TRIGLYCERIDES: 62 mg/dL (ref <150)
VLDL: 12 mg/dL (ref 0–40)

## 2013-06-02 LAB — POCT CBC
Granulocyte percent: 64 %G (ref 37–80)
HCT, POC: 44.1 % (ref 43.5–53.7)
HEMOGLOBIN: 13.9 g/dL — AB (ref 14.1–18.1)
Lymph, poc: 2.6 (ref 0.6–3.4)
MCH, POC: 26.9 pg — AB (ref 27–31.2)
MCHC: 31.5 g/dL — AB (ref 31.8–35.4)
MCV: 85.3 fL (ref 80–97)
MPV: 8.2 fL (ref 0–99.8)
POC GRANULOCYTE: 5.4 (ref 2–6.9)
POC LYMPH %: 30.2 % (ref 10–50)
Platelet Count, POC: 173 10*3/uL (ref 142–424)
RBC: 5.2 M/uL (ref 4.69–6.13)
RDW, POC: 13.8 %
WBC: 8.5 10*3/uL (ref 4.6–10.2)

## 2013-06-02 LAB — HEPATIC FUNCTION PANEL
ALBUMIN: 4.1 g/dL (ref 3.5–5.2)
ALT: 17 U/L (ref 0–53)
AST: 22 U/L (ref 0–37)
Alkaline Phosphatase: 34 U/L — ABNORMAL LOW (ref 39–117)
BILIRUBIN TOTAL: 0.7 mg/dL (ref 0.2–1.2)
Bilirubin, Direct: 0.2 mg/dL (ref 0.0–0.3)
Indirect Bilirubin: 0.5 mg/dL (ref 0.2–1.2)
Total Protein: 6.8 g/dL (ref 6.0–8.3)

## 2013-06-02 MED ORDER — ESOMEPRAZOLE MAGNESIUM 40 MG PO CPDR: DELAYED_RELEASE_CAPSULE | ORAL | Status: DC

## 2013-06-02 MED ORDER — HYDROCODONE-ACETAMINOPHEN 7.5-325 MG PO TABS: 1.0000 | ORAL_TABLET | Freq: Three times a day (TID) | ORAL | Status: DC | PRN

## 2013-06-02 MED ORDER — ROSUVASTATIN CALCIUM 20 MG PO TABS: 20.0000 mg | ORAL_TABLET | Freq: Every day | ORAL | Status: DC

## 2013-06-02 MED ORDER — ROSUVASTATIN CALCIUM 40 MG PO TABS: 40.0000 mg | ORAL_TABLET | Freq: Every day | ORAL | Status: DC

## 2013-06-02 NOTE — Addendum Note (Signed)
Addended by: Pollyann Kennedy F on: 06/02/2013 05:15 PM   Modules accepted: Orders

## 2013-06-02 NOTE — Patient Instructions (Addendum)
Continue current medications. Continue good therapeutic lifestyle changes which include good diet and exercise. Fall precautions discussed with patient. If an FOBT was given today- please return it to our front desk. If you are over 62 years old - you may need Prevnar 59 or the adult Pneumonia vaccine.  Try using the Crestor savings:. Call [employer] and tell them the situation and see if they will honor it Continue to exercise as much as possible and try to lose as much weight as possible Continue to drink plenty of fluids We will call you with the results of the lab work was as results are available

## 2013-06-02 NOTE — Progress Notes (Signed)
Subjective:    Patient ID: Paul Johnston, male    DOB: 09-08-1950, 63 y.o.   MRN: [id number]  HPI Pt here for follow up and management of chronic medical problems. The patient has a history of degenerative disc disease of the lumbar spine and he sees the orthopedist periodically for this. He complains of a irritated lesion on his back that itches a lot. He is currently taking Crestor 20 or one half of a 40 daily. He'll get his lab work done today and we will remove the lesion on his back today.        Patient Active Problem List   Diagnosis Date Noted  . GERD (gastroesophageal reflux disease) 09/01/2012  . Vitamin D deficiency 09/01/2012  . Solitary pulmonary nodule, history of, left side 09/01/2012  . BPH (benign prostatic hyperplasia) 09/01/2012  . Umbilical hernia 08/65/7846  . Hyperlipidemia 12/14/2008  . SMOKER 12/14/2008   Outpatient Encounter Prescriptions as of 06/02/2013  Medication Sig  . aspirin 81 MG tablet Take 81 mg by mouth daily.  . cholecalciferol (VITAMIN D) 1000 UNITS tablet Take 1,000 Units by mouth daily.  Marland Kitchen esomeprazole (NEXIUM) 40 MG capsule TAKE 1 CAPSULE DAILY AT 12 NOON  . fish oil-omega-3 fatty acids 1000 MG capsule Take 2 g by mouth daily.  . rosuvastatin (CRESTOR) 40 MG tablet Take 40 mg by mouth daily.  . [DISCONTINUED] rosuvastatin (CRESTOR) 40 MG tablet Take 0.5 tablets (20 mg total) by mouth daily.  . ranitidine (ZANTAC) 150 MG tablet Take 150 mg by mouth as needed for heartburn.    Review of Systems  Constitutional: Negative.   HENT: Negative.   Eyes: Negative.   Respiratory: Negative.   Cardiovascular: Negative.   Gastrointestinal: Negative.   Endocrine: Negative.   Genitourinary: Negative.   Musculoskeletal: Positive for back pain.  Skin: Negative.   Allergic/Immunologic: Negative.   Neurological: Negative.   Hematological: Negative.   Psychiatric/Behavioral: Negative.        Objective:   Physical Exam  Nursing note and vitals  reviewed. Constitutional: He is oriented to person, place, and time. He appears well-developed and well-nourished. No distress.  HENT:  Head: Normocephalic and atraumatic.  Right Ear: External ear normal.  Left Ear: External ear normal.  Nose: Nose normal.  Mouth/Throat: Oropharynx is clear and moist. No oropharyngeal exudate.  Eyes: Conjunctivae and EOM are normal. Pupils are equal, round, and reactive to light. Right eye exhibits no discharge. Left eye exhibits no discharge. No scleral icterus.  Neck: Normal range of motion. Neck supple. No thyromegaly present.  Cardiovascular: Normal rate, regular rhythm, normal heart sounds and intact distal pulses.  Exam reveals no gallop and no friction rub.   No murmur heard. At 96 per minute  Pulmonary/Chest: Effort normal and breath sounds normal. No respiratory distress. He has no wheezes. He has no rales. He exhibits no tenderness.  Abdominal: Soft. Bowel sounds are normal. He exhibits no mass. There is no tenderness. There is no rebound and no guarding.  Obesity  Musculoskeletal: Normal range of motion. He exhibits no edema and no tenderness.  Lymphadenopathy:    He has no cervical adenopathy.  Neurological: He is alert and oriented to person, place, and time. He has normal reflexes. No cranial nerve deficit.  Skin: Skin is warm and dry. No rash noted. No erythema. No pallor.  Psychiatric: He has a normal mood and affect. His behavior is normal. Judgment and thought content normal.   BP 134/70  Pulse 67  Temp(Src) 97.8 F (36.6 C) (Oral)  Ht 5\' 9"  (1.753 m)  Wt 258 lb (117.028 kg)  BMI 38.08 kg/m2  Lesion was removed after cleansing without problems to the back and it was sent off for pathology     Assessment & Plan:  1. Fatigue - POCT CBC - BASIC METABOLIC PANEL WITH GFR  2. Hyperlipemia - Hepatic function panel - Lipid panel  3. Vitamin D deficiency - Vitamin D 25 hydroxy  4. BPH (benign prostatic hyperplasia)  5. GERD  (gastroesophageal reflux disease)  6. Hyperlipidemia  7. Degenerative disc disease, lumbar -Hydrocodone refill  8. Neoplasm of uncertain behavior -Excision/pathology  Patient Instructions  Continue current medications. Continue good therapeutic lifestyle changes which include good diet and exercise. Fall precautions discussed with patient. If an FOBT was given today- please return it to our front desk. If you are over 66 years old - you may need Prevnar 37 or the adult Pneumonia vaccine.  Try using the Crestor savings:. Call [facility] and tell them the situation and see if they will honor it Continue to exercise as much as possible and try to lose as much weight as possible Continue to drink plenty of fluids We will call you with the results of the lab work was as results are available   Arrie Senate MD

## 2013-06-03 LAB — VITAMIN D 25 HYDROXY (VIT D DEFICIENCY, FRACTURES): VIT D 25 HYDROXY: 43 ng/mL (ref 30–89)

## 2013-06-04 LAB — PATHOLOGY

## 2013-06-05 ENCOUNTER — Encounter: Payer: Self-pay | Admitting: Family Medicine

## 2013-06-08 ENCOUNTER — Other Ambulatory Visit: Payer: Self-pay | Admitting: *Deleted

## 2013-06-08 DIAGNOSIS — IMO0002 Reserved for concepts with insufficient information to code with codable children: Secondary | ICD-10-CM

## 2013-11-01 ENCOUNTER — Other Ambulatory Visit: Payer: Self-pay | Admitting: *Deleted

## 2013-11-01 DIAGNOSIS — R7989 Other specified abnormal findings of blood chemistry: Secondary | ICD-10-CM

## 2013-11-01 DIAGNOSIS — R945 Abnormal results of liver function studies: Secondary | ICD-10-CM

## 2013-11-01 DIAGNOSIS — R739 Hyperglycemia, unspecified: Secondary | ICD-10-CM

## 2013-12-09 ENCOUNTER — Encounter: Payer: Self-pay | Admitting: Family Medicine

## 2013-12-09 ENCOUNTER — Ambulatory Visit (INDEPENDENT_AMBULATORY_CARE_PROVIDER_SITE_OTHER): Payer: BLUE CROSS/BLUE SHIELD | Admitting: Family Medicine

## 2013-12-09 VITALS — BP 135/82 | HR 76 | Temp 97.4°F | Ht 69.0 in | Wt 260.0 lb

## 2013-12-09 DIAGNOSIS — E669 Obesity, unspecified: Secondary | ICD-10-CM

## 2013-12-09 DIAGNOSIS — E785 Hyperlipidemia, unspecified: Secondary | ICD-10-CM

## 2013-12-09 DIAGNOSIS — M791 Myalgia, unspecified site: Secondary | ICD-10-CM

## 2013-12-09 DIAGNOSIS — K219 Gastro-esophageal reflux disease without esophagitis: Secondary | ICD-10-CM | POA: Diagnosis not present

## 2013-12-09 DIAGNOSIS — E559 Vitamin D deficiency, unspecified: Secondary | ICD-10-CM

## 2013-12-09 DIAGNOSIS — Z0000 Encounter for general adult medical examination without abnormal findings: Secondary | ICD-10-CM | POA: Diagnosis not present

## 2013-12-09 DIAGNOSIS — R739 Hyperglycemia, unspecified: Secondary | ICD-10-CM

## 2013-12-09 DIAGNOSIS — N400 Enlarged prostate without lower urinary tract symptoms: Secondary | ICD-10-CM | POA: Diagnosis not present

## 2013-12-09 DIAGNOSIS — R7309 Other abnormal glucose: Secondary | ICD-10-CM

## 2013-12-09 DIAGNOSIS — E66811 Obesity, class 1: Secondary | ICD-10-CM | POA: Insufficient documentation

## 2013-12-09 DIAGNOSIS — R5383 Other fatigue: Secondary | ICD-10-CM

## 2013-12-09 LAB — POCT GLYCOSYLATED HEMOGLOBIN (HGB A1C)

## 2013-12-09 MED ORDER — ESOMEPRAZOLE MAGNESIUM 40 MG PO CPDR: DELAYED_RELEASE_CAPSULE | ORAL | Status: DC

## 2013-12-09 NOTE — Patient Instructions (Addendum)
Continue current medications. Continue good therapeutic lifestyle changes which include good diet and exercise. Fall precautions discussed with patient. If an FOBT was given today- please return it to our front desk. If you are over 62 years old - you may need Prevnar 37 or the adult Pneumonia vaccine.  Flu Shots will be available at our office starting mid- September. Please call and schedule a FLU CLINIC APPOINTMENT.   Hold the Crestor for 1 month and see if her muscle aches and myalgias improved If they do improve please call us back and we might consider trying a different statin to see if this would cause less muscle aches and myalgias We will call him with the additional lab work there were going to do today as soon as those results are available he will be due for colonoscopy in one year. Continue to work on your weight and exercise as much as possible

## 2013-12-09 NOTE — Progress Notes (Signed)
Subjective:    Patient ID: Paul Johnston, male    DOB: 1950-11-28, 63 y.o.   MRN: [id number]  HPI Patient is here today for annual wellness exam and follow up of chronic medical problems. The patient's recent lab work was reviewed with him. All his numbers were good except the LDH was elevated and he did not have a thyroid profile. He also had an increased blood sugar and there is no hemoglobin A1c. Today we will check liver function tests hemoglobin A1c and sedimentation rate and CK.          Patient Active Problem List   Diagnosis Date Noted  . GERD (gastroesophageal reflux disease) 09/01/2012  . Vitamin D deficiency 09/01/2012  . Solitary pulmonary nodule, history of, left side 09/01/2012  . BPH (benign prostatic hyperplasia) 09/01/2012  . Umbilical hernia 09/24/4816  . Hyperlipidemia 12/14/2008  . SMOKER 12/14/2008   Outpatient Encounter Prescriptions as of 12/09/2013  Medication Sig  . aspirin 81 MG tablet Take 81 mg by mouth daily.  . cholecalciferol (VITAMIN D) 1000 UNITS tablet Take 1,000 Units by mouth daily.  Marland Kitchen esomeprazole (NEXIUM) 40 MG capsule TAKE 1 CAPSULE DAILY AT 12 NOON  . fish oil-omega-3 fatty acids 1000 MG capsule Take 2 g by mouth daily.  Marland Kitchen HYDROcodone-acetaminophen (NORCO) 7.5-325 MG per tablet Take 1 tablet by mouth every 8 (eight) hours as needed for moderate pain.  . ranitidine (ZANTAC) 150 MG tablet Take 150 mg by mouth as needed for heartburn.  . rosuvastatin (CRESTOR) 40 MG tablet Take 1 tablet (40 mg total) by mouth daily.  . [DISCONTINUED] rosuvastatin (CRESTOR) 20 MG tablet Take 1 tablet (20 mg total) by mouth daily.    Review of Systems  Constitutional: Positive for fatigue.  HENT: Negative.   Eyes: Negative.   Respiratory: Negative.   Cardiovascular: Negative.   Gastrointestinal: Negative.   Endocrine: Negative.   Genitourinary: Negative.   Musculoskeletal: Positive for back pain (recent pain) and myalgias (arms and legs hurt/ sore at  times).  Skin: Negative.   Allergic/Immunologic: Negative.   Neurological: Negative.   Hematological: Negative.   Psychiatric/Behavioral: Negative.        Objective:   Physical Exam  Nursing note and vitals reviewed. Constitutional: He is oriented to person, place, and time. He appears well-developed and well-nourished. No distress.  Pleasant and cooperative and overweight  HENT:  Head: Normocephalic and atraumatic.  Right Ear: External ear normal.  Left Ear: External ear normal.  Mouth/Throat: Oropharynx is clear and moist. No oropharyngeal exudate.  Nasal congestion  Eyes: Conjunctivae and EOM are normal. Pupils are equal, round, and reactive to light. Right eye exhibits no discharge. Left eye exhibits no discharge. No scleral icterus.  Neck: Normal range of motion. Neck supple. No thyromegaly present.  No anterior cervical adenopathy or carotid bruits  Cardiovascular: Normal rate, regular rhythm, normal heart sounds and intact distal pulses.  Exam reveals no gallop and no friction rub.   No murmur heard. At 72/m  Pulmonary/Chest: Effort normal and breath sounds normal. No respiratory distress. He has no wheezes. He has no rales. He exhibits no tenderness.  No axillary adenopathy  Abdominal: Soft. Bowel sounds are normal. He exhibits no mass. There is no tenderness. There is no rebound and no guarding.  Obese without masses or tenderness  Genitourinary: Rectum normal and penis normal.  The prostate was enlarged, soft without masses. There were no rectal masses. The external genitalia were normal without hernias or inguinal adenopathy. There  were some external hemorrhoids.  Musculoskeletal: Normal range of motion. He exhibits no edema and no tenderness.  Lymphadenopathy:    He has no cervical adenopathy.  Neurological: He is alert and oriented to person, place, and time. He has normal reflexes. No cranial nerve deficit.  Skin: Skin is warm and dry. No rash noted. No erythema. No  pallor.  Psychiatric: He has a normal mood and affect. His behavior is normal. Judgment and thought content normal.   BP 135/82  Pulse 76  Temp(Src) 97.4 F (36.3 C) (Oral)  Ht 5\' 9"  (1.753 m)  Wt 260 lb (117.935 kg)  BMI 38.38 kg/m2        Assessment & Plan:  1. BPH (benign prostatic hyperplasia)  2. Gastroesophageal reflux disease, esophagitis presence not specified - Hepatic function panel  3. Hyperlipidemia  4. Vitamin D deficiency - Vit D  25 hydroxy (rtn osteoporosis monitoring)  5. Elevated blood sugar - POCT glycosylated hemoglobin (Hb A1C)  6. Myalgia- POCT glycosylated hemoglobin (Hb A1C) - Vit D  25 hydroxy (rtn osteoporosis monitoring) - Thyroid Panel With TSH - Hepatic function panel - Sedimentation rate - CK  7. Other fatigue  - POCT glycosylated hemoglobin (Hb A1C) - Vit D  25 hydroxy (rtn osteoporosis monitoring) - Thyroid Panel With TSH - Hepatic function panel - Sedimentation rate - CK  8. Annual physical exam  9. Obesity -Continue to work on weight and watch diet  Meds ordered this encounter  Medications  . esomeprazole (NEXIUM) 40 MG capsule    Sig: TAKE 1 CAPSULE DAILY AT 12 NOON    Dispense:  90 capsule    Refill:  3   Patient Instructions  Continue current medications. Continue good therapeutic lifestyle changes which include good diet and exercise. Fall precautions discussed with patient. If an FOBT was given today- please return it to our front desk. If you are over 48 years old - you may need Prevnar 56 or the adult Pneumonia vaccine.  Flu Shots will be available at our office starting mid- September. Please call and schedule a FLU CLINIC APPOINTMENT.   Hold the Crestor for 1 month and see if her muscle aches and myalgias improved If they do improve please call us back and we might consider trying a different statin to see if this would cause less muscle aches and myalgias We will call him with the additional lab work  there were going to do today as soon as those results are available he will be due for colonoscopy in one year. Continue to work on your weight and exercise as much as possible    Arrie Senate MD

## 2013-12-10 ENCOUNTER — Telehealth: Payer: Self-pay | Admitting: *Deleted

## 2013-12-10 LAB — HEPATIC FUNCTION PANEL
ALBUMIN: 4.3 g/dL (ref 3.6–4.8)
ALK PHOS: 51 IU/L (ref 39–117)
ALT: 29 IU/L (ref 0–44)
AST: 22 IU/L (ref 0–40)
Bilirubin, Direct: 0.17 mg/dL (ref 0.00–0.40)
Total Bilirubin: 0.4 mg/dL (ref 0.0–1.2)
Total Protein: 7.1 g/dL (ref 6.0–8.5)

## 2013-12-10 LAB — SEDIMENTATION RATE: Sed Rate: 21 mm/hr (ref 0–30)

## 2013-12-10 LAB — VITAMIN D 25 HYDROXY (VIT D DEFICIENCY, FRACTURES): Vit D, 25-Hydroxy: 40.1 ng/mL (ref 30.0–100.0)

## 2013-12-10 LAB — THYROID PANEL WITH TSH
Free Thyroxine Index: 2 (ref 1.2–4.9)
T3 Uptake Ratio: 27 % (ref 24–39)
T4, Total: 7.5 ug/dL (ref 4.5–12.0)
TSH: 1.57 u[IU]/mL (ref 0.450–4.500)

## 2013-12-10 LAB — CK: Total CK: 138 U/L (ref 24–204)

## 2013-12-14 ENCOUNTER — Encounter: Payer: Self-pay | Admitting: Family Medicine

## 2013-12-15 ENCOUNTER — Telehealth: Payer: Self-pay | Admitting: *Deleted

## 2013-12-15 NOTE — Telephone Encounter (Signed)
-----   Message from Chipper Herb, MD sent at 12/10/2013  8:39 PM EDT ----- The vitamin D level is 40.1.--- Please have the patient increase this to 2000 daily. He can finish the 1000 daily by taking 2 at a time and then he can purchase the 2000 daily. All thyroid function tests are within normal limits All liver function tests are within normal limits The sedimentation rate, a nonspecific test for inflammation, is within normal limits at 21. The normal range is 0-30. The CK a another test for inflammation especially one associated with inflammation from taking cholesterol medicine is within normal limits at 138.

## 2013-12-16 ENCOUNTER — Other Ambulatory Visit: Payer: BC Managed Care – PPO

## 2013-12-16 DIAGNOSIS — Z1212 Encounter for screening for malignant neoplasm of rectum: Secondary | ICD-10-CM

## 2013-12-18 LAB — FECAL OCCULT BLOOD, IMMUNOCHEMICAL: FECAL OCCULT BLD: NEGATIVE

## 2013-12-22 ENCOUNTER — Encounter: Payer: Self-pay | Admitting: Pharmacist

## 2013-12-22 ENCOUNTER — Ambulatory Visit (INDEPENDENT_AMBULATORY_CARE_PROVIDER_SITE_OTHER): Payer: BC Managed Care – PPO | Admitting: Pharmacist

## 2013-12-22 ENCOUNTER — Encounter: Payer: Self-pay | Admitting: Family Medicine

## 2013-12-22 VITALS — BP 134/78 | HR 75 | Ht 69.0 in | Wt 258.0 lb

## 2013-12-22 DIAGNOSIS — E119 Type 2 diabetes mellitus without complications: Secondary | ICD-10-CM

## 2013-12-22 DIAGNOSIS — E669 Obesity, unspecified: Secondary | ICD-10-CM

## 2013-12-22 DIAGNOSIS — E785 Hyperlipidemia, unspecified: Secondary | ICD-10-CM

## 2013-12-22 MED ORDER — PHENTERMINE HCL 37.5 MG PO TABS: ORAL_TABLET | ORAL | Status: DC

## 2013-12-22 NOTE — Progress Notes (Signed)
Subjective:    Paul Johnston is a 63 y.o. male who presents for an initial evaluation of obesity, Type 2 diabetes mellitus and diabetes education.  Patient was recently diagnosed with type 2 DM by A1c of 6.5%.  He was not having any symptome of DM and has no first degree relatives with DM.  He states that he has had borderline DM for several years.    Known diabetic complications: none  Current diabetic medications include none.   Eye exam current (within one year): unknown Weight trend: reports gainind 60# since he quit smoking in 2010.  Prior visit with dietician: no Current diet: in general, an "unhealthy" diet, low fat/ cholesterol, high salt, high carbohydrates and eats out alot.  Especially fast food. Current exercise: none  Current monitoring regimen: none Home blood sugar records: not checking BG at home current but he does have a glucometer and testing supplies  Any episodes of hypoglycemia? no  Is He on ACE inhibitor or angiotensin II receptor blocker?  No    Cardiovascular risk factors: advanced age (older than 60 for men, 30 for women), diabetes mellitus, dyslipidemia, male gender, obesity (BMI >= 30 kg/m2) and sedentary lifestyle   Patient has hyperlipidemia which he was taking Crestor with good results but recently he has been holding Crestor due to having pain in legs which was suspected to be related to Crestor.    The following portions of the patient's history were reviewed and updated as appropriate: allergies, current medications, past family history, past medical history, past social history, past surgical history and problem list.  Objective:    BP 134/78 mmHg  Pulse 75  Ht 5\' 9"  (1.753 m)  Wt 258 lb (117.028 kg)  BMI 38.08 kg/m2  Lab Review GLUCOSE, BLD (mg/dL)  Date Value  06/02/2013 99  11/18/2012 91   CO2 (mEq/L)  Date Value  06/02/2013 30  11/18/2012 26   BUN (mg/dL)  Date Value  06/02/2013 18  11/18/2012 14   CREAT (mg/dL)  Date Value   06/02/2013 0.93  11/18/2012 0.82   none    Assessment:    Diabetes Mellitus type II, under inadequate control Obesity Hyperlipidemia.    Plan:    1.  Rx changes: trial of phentermine 37.5mg  1/2 to 1 tablet qam.  We discussed several other pharmacological interventions including Saxenda and metformin.  Patient was concerned about Saxenda's side effects and that it was an injectable medication.  Also discussed metabolic surgery but patient declined.   2.  Education: Reviewed 'ABCs' of diabetes management (respective goals in parentheses):  A1C (<7), blood pressure (<130/80), and cholesterol (LDL <100). 3.  Spent 40 minutes discussing diet.  CHO counting diet discussed.  Included 15 gram serving sizes.  Recommended 50 to 60 grams of CHO per meal and 15 to 20 grams of CHO per snack.  Recommended increasing non starchy vegetables.  Adding 2 to 3 days of salads for lunch instead of burger and fries.  No regular sodas or juices.  1-2 pieces of fresh fruit daily.    4.   increased exercise 5.  regular blood sugar monitoring: one time daily or every other day.  Discussed BG goals 6.  Follow up: 4 weeks  - check BP/HR/weight/BG  Cherre Robins, PharmD, CPP, CDE

## 2013-12-22 NOTE — Patient Instructions (Signed)
Diabetes and Standards of Medical Care   Diabetes is complicated. You may find that your diabetes team includes a dietitian, nurse, diabetes educator, eye doctor, and more. To help everyone know what is going on and to help you get the care you deserve, the following schedule of care was developed to help keep you on track. Below are the tests, exams, vaccines, medicines, education, and plans you will need.  Blood Glucose Goals Prior to meals = 80 - 130 Within 2 hours of the start of a meal = less than 180  HbA1c test (goal is less than 6.5% - your last value was 6.5%) This test shows how well you have controlled your glucose over the past 2 to 3 months. It is used to see if your diabetes management plan needs to be adjusted.   It is performed at least 2 times a year if you are meeting treatment goals.  It is performed 4 times a year if therapy has changed or if you are not meeting treatment goals.  Blood pressure test  This test is performed at every routine medical visit. The goal is less than 140/90 mmHg for most people, but 130/80 mmHg in some cases. Ask your health care provider about your goal.  Dental exam  Follow up with the dentist regularly.  Eye exam  If you are diagnosed with type 1 diabetes as a child, get an exam upon reaching the age of 10 years or older and have had diabetes for 3 to 5 years. Yearly eye exams are recommended after that initial eye exam.  If you are diagnosed with type 1 diabetes as an adult, get an exam within 5 years of diagnosis and then yearly.  If you are diagnosed with type 2 diabetes, get an exam as soon as possible after the diagnosis and then yearly.  Foot care exam  Visual foot exams are performed at every routine medical visit. The exams check for cuts, injuries, or other problems with the feet.  A comprehensive foot exam should be done yearly. This includes visual inspection as well as assessing foot pulses and testing for loss of  sensation.  Check your feet nightly for cuts, injuries, or other problems with your feet. Tell your health care provider if anything is not healing.  Kidney function test (urine microalbumin)  This test is performed once a year.  Type 1 diabetes: The first test is performed 5 years after diagnosis.  Type 2 diabetes: The first test is performed at the time of diagnosis.  A serum creatinine and estimated glomerular filtration rate (eGFR) test is done once a year to assess the level of chronic kidney disease (CKD), if present.  Lipid profile (cholesterol, HDL, LDL, triglycerides)  Performed every 5 years for most people.  The goal for LDL is less than 100 mg/dL. If you are at high risk, the goal is less than 70 mg/dL.  The goal for HDL is 40 mg/dL to 50 mg/dL for men and 50 mg/dL to 60 mg/dL for women. An HDL cholesterol of 60 mg/dL or higher gives some protection against heart disease.  The goal for triglycerides is less than 150 mg/dL.  Influenza vaccine, pneumococcal vaccine, and hepatitis B vaccine  The influenza vaccine is recommended yearly.  The pneumococcal vaccine is generally given once in a lifetime. However, there are some instances when another vaccination is recommended. Check with your health care provider.  The hepatitis B vaccine is also recommended for adults with diabetes.    Diabetes self-management education  Education is recommended at diagnosis and ongoing as needed.  Treatment plan  Your treatment plan is reviewed at every medical visit.  Document Released: 11/25/2008 Document Revised: 09/30/2012 Document Reviewed: 06/30/2012 [facility] Patient Information 2014 [place].

## 2014-01-27 ENCOUNTER — Encounter: Payer: Self-pay | Admitting: Pharmacist

## 2014-01-27 ENCOUNTER — Ambulatory Visit (INDEPENDENT_AMBULATORY_CARE_PROVIDER_SITE_OTHER): Payer: BC Managed Care – PPO | Admitting: Pharmacist

## 2014-01-27 VITALS — BP 142/76 | HR 84 | Ht 69.0 in | Wt 249.5 lb

## 2014-01-27 DIAGNOSIS — E119 Type 2 diabetes mellitus without complications: Secondary | ICD-10-CM

## 2014-01-27 DIAGNOSIS — R635 Abnormal weight gain: Secondary | ICD-10-CM

## 2014-01-27 DIAGNOSIS — E669 Obesity, unspecified: Secondary | ICD-10-CM

## 2014-01-27 DIAGNOSIS — E785 Hyperlipidemia, unspecified: Secondary | ICD-10-CM

## 2014-01-27 NOTE — Progress Notes (Signed)
Subjective:    Paul Johnston is a 63 y.o. male who presents for  Re-evaluation of obesity, Type 2 diabetes mellitus and diabetes education.  Patient was diagnosed with type 2 DM by A1c of 6.5% 12/09/2013.  At our last visit discussed dietary changes to help with DM and weight loss.  Patient started phentermine 37.5mg  and takes 1/2 tablet qam.  He states that it has helped with appetite.  He has lost 8.5#    Known diabetic complications: none  Current diabetic medications include none.   Eye exam current (within one year): unknown Weight trend: reports gainind 60# since he quit smoking in 2010 but has lost 8.5# over last 6 weeks. Current diet:  Improving - fewer CHO's especially less bread and sugar containing drinks.  Has switched to unsweet tea. Current exercise: none  Current monitoring regimen: checks 1-2 times per week Home blood sugar records: 100 to 180's Any episodes of hypoglycemia? no  Is He on ACE inhibitor or angiotensin II receptor blocker?  No    Cardiovascular risk factors: advanced age (older than 69 for men, 7 for women), diabetes mellitus, dyslipidemia, male gender, obesity (BMI >= 30 kg/m2) and sedentary lifestyle   Patient has hyperlipidemia which he was taking Crestor with good results but recently he has been holding Crestor due to having pain in legs which was suspected to be related to Crestor.    The following portions of the patient's history were reviewed and updated as appropriate: allergies, current medications, past family history, past medical history, past social history, past surgical history and problem list.  Objective:    RBG in office today = 105  Lab Review GLUCOSE, BLD (mg/dL)  Date Value  06/02/2013 99  11/18/2012 91   CO2 (mEq/L)  Date Value  06/02/2013 30  11/18/2012 26   BUN (mg/dL)  Date Value  06/02/2013 18  11/18/2012 14   CREAT (mg/dL)  Date Value  06/02/2013 0.93  11/18/2012 0.82   none    Assessment:    Diabetes  Mellitus type II, under improving control Obesity - Has lost 8.5# Hyperlipidemia.    Plan:    1.  Rx changes:   Continue phentermine 37.5mg  1/2 to 1 tablet qam.  Continue to hold crestor  2.  Education: Reviewed 'ABCs' of diabetes management (respective goals in parentheses):  A1C (<7), blood pressure (<130/80), and cholesterol (LDL <100). 3.  Reviewed CHO counting - serving sizes, etc 4.   increased exercise - discussed using [employer] membership 5.  regular blood sugar monitoring: every other day.  Discussed BG goals 6.  Follow up: 4 weeks  - check BP/HR/weight/BG  Cherre Robins, PharmD, CPP, CDE

## 2014-02-27 ENCOUNTER — Encounter: Payer: Self-pay | Admitting: Family Medicine

## 2014-02-28 MED ORDER — PHENTERMINE HCL 37.5 MG PO TABS: ORAL_TABLET | ORAL | Status: DC

## 2014-02-28 NOTE — Telephone Encounter (Signed)
Rx for #30 tablets of phentermine 37.5mg  called in to [facility]

## 2014-03-11 ENCOUNTER — Encounter: Payer: Self-pay | Admitting: Pharmacist

## 2014-03-11 ENCOUNTER — Ambulatory Visit (INDEPENDENT_AMBULATORY_CARE_PROVIDER_SITE_OTHER): Payer: BLUE CROSS/BLUE SHIELD | Admitting: Pharmacist

## 2014-03-11 VITALS — BP 128/74 | HR 80 | Ht 69.0 in | Wt 244.0 lb

## 2014-03-11 DIAGNOSIS — E669 Obesity, unspecified: Secondary | ICD-10-CM

## 2014-03-11 DIAGNOSIS — E785 Hyperlipidemia, unspecified: Secondary | ICD-10-CM

## 2014-03-11 DIAGNOSIS — E119 Type 2 diabetes mellitus without complications: Secondary | ICD-10-CM

## 2014-03-11 LAB — POCT GLYCOSYLATED HEMOGLOBIN (HGB A1C): Hemoglobin A1C: 6.2

## 2014-03-11 LAB — GLUCOSE, POCT (MANUAL RESULT ENTRY): POC Glucose: 100 mg/dl — AB (ref 70–99)

## 2014-03-11 NOTE — Progress Notes (Signed)
Subjective:    Paul Johnston is a 64 y.o. male who presents for  Re-evaluation of obesity and type 2 diabetes Patient was diagnosed with type 2 DM by A1c of 6.5% 12/09/2013.  At our last visit reviewed dietary changes to help with DM and weight loss.  Patient is still taking phentermine 37.5mg  and takes 1/2 tablet qam.  He states that it has helped with appetite.  He has lost 6# more for a total of 14.5#  Known diabetic complications: none  Current diabetic medications include none.   Weight trend: reports gainind 60# since he quit smoking in 2010 but has lost 14.5# over last 12 weeks. Current diet:  Improving - fewer CHO's especially less bread and sugar containing drinks. Continued to decrease sugar intake but has moments of indiscretion.  Current exercise: none  Current monitoring regimen: checks 1-2 times per week Any episodes of hypoglycemia? no  Is He on ACE inhibitor or angiotensin II receptor blocker?  No    Cardiovascular risk factors: advanced age (older than 53 for men, 58 for women), diabetes mellitus, dyslipidemia, male gender, obesity (BMI >= 30 kg/m2) and sedentary lifestyle   Patient has hyperlipidemia which he was taking Crestor with good results but he has been holding Crestor due to having pain in legs which was suspected to be related to Crestor.    The following portions of the patient's history were reviewed and updated as appropriate: allergies, current medications, past family history, past medical history, past social history, past surgical history and problem list.  Objective:    RBG in office today = 100 A1c = 6.2% today in office  Lab Review GLUCOSE, BLD (mg/dL)  Date Value  06/02/2013 99  11/18/2012 91   CO2 (mEq/L)  Date Value  06/02/2013 30  11/18/2012 26   BUN (mg/dL)  Date Value  06/02/2013 18  11/18/2012 14   CREAT (mg/dL)  Date Value  06/02/2013 0.93  11/18/2012 0.82   none    Assessment:    Diabetes Mellitus type II, controlled  with diet Obesity - Has lost 14.5# total Hyperlipidemia.    Plan:    1.  Rx changes:   Continue phentermine 37.5mg  1/2 to 1 tablet qam.  Continue to hold crestor  2.  Education: Reviewed 'ABCs' of diabetes management (respective goals in parentheses):  A1C (<6.5%), blood pressure (<130/80), and cholesterol (LDL <100). 3.  Reviewed CHO counting.  Also addressed decreasing sugar intake.  Patient given handout with tips for decreasing sugar in diet. 4.   Again recommended increased exercise - start with 10 minutes daily and work toward 30 minutes daily 5.  regular blood sugar monitoring: every other day.  Discussed BG goals 6.  Follow up: 12 weeks with PCP  Cherre Robins, PharmD, CPP, CDE

## 2014-03-11 NOTE — Patient Instructions (Signed)
Diabetes and Standards of Medical Care   Diabetes is complicated. You may find that your diabetes team includes a dietitian, nurse, diabetes educator, eye doctor, and more. To help everyone know what is going on and to help you get the care you deserve, the following schedule of care was developed to help keep you on track. Below are the tests, exams, vaccines, medicines, education, and plans you will need.  Blood Glucose Goals - your blood glucose in the office today was 100 Prior to meals = 80 - 130 Within 2 hours of the start of a meal = less than 180  HbA1c test (goal is less than 7.0% - your A1c was 6.2 - down from 6.5%) This test shows how well you have controlled your glucose over the past 2 to 3 months. It is used to see if your diabetes management plan needs to be adjusted.   It is performed at least 2 times a year if you are meeting treatment goals.  It is performed 4 times a year if therapy has changed or if you are not meeting treatment goals.  Blood pressure test  This test is performed at every routine medical visit. The goal is less than 140/90 mmHg for most people, but 130/80 mmHg in some cases. Ask your health care provider about your goal.  Dental exam  Follow up with the dentist regularly.  Eye exam  If you are diagnosed with type 1 diabetes as a child, get an exam upon reaching the age of 39 years or older and have had diabetes for 3 to 5 years. Yearly eye exams are recommended after that initial eye exam.  If you are diagnosed with type 1 diabetes as an adult, get an exam within 5 years of diagnosis and then yearly.  If you are diagnosed with type 2 diabetes, get an exam as soon as possible after the diagnosis and then yearly.  Foot care exam  Visual foot exams are performed at every routine medical visit. The exams check for cuts, injuries, or other problems with the feet.  A comprehensive foot exam should be done yearly. This includes visual inspection as  well as assessing foot pulses and testing for loss of sensation.  Check your feet nightly for cuts, injuries, or other problems with your feet. Tell your health care provider if anything is not healing.  Kidney function test (urine microalbumin)  This test is performed once a year.  Type 1 diabetes: The first test is performed 5 years after diagnosis.  Type 2 diabetes: The first test is performed at the time of diagnosis.  A serum creatinine and estimated glomerular filtration rate (eGFR) test is done once a year to assess the level of chronic kidney disease (CKD), if present.  Lipid profile (cholesterol, HDL, LDL, triglycerides)  Performed every 5 years for most people.  The goal for LDL is less than 100 mg/dL. If you are at high risk, the goal is less than 70 mg/dL.  The goal for HDL is 40 mg/dL to 50 mg/dL for men and 50 mg/dL to 60 mg/dL for women. An HDL cholesterol of 60 mg/dL or higher gives some protection against heart disease.  The goal for triglycerides is less than 150 mg/dL.  Influenza vaccine, pneumococcal vaccine, and hepatitis B vaccine  The influenza vaccine is recommended yearly.  The pneumococcal vaccine is generally given once in a lifetime. However, there are some instances when another vaccination is recommended. Check with your health care provider.  The hepatitis B vaccine is also recommended for adults with diabetes.  Diabetes self-management education  Education is recommended at diagnosis and ongoing as needed.  Treatment plan  Your treatment plan is reviewed at every medical visit.  Document Released: 11/25/2008 Document Revised: 09/30/2012 Document Reviewed: 06/30/2012 [facility] Patient Information 2014 [place].

## 2014-04-03 ENCOUNTER — Encounter: Payer: Self-pay | Admitting: Family Medicine

## 2014-04-06 ENCOUNTER — Other Ambulatory Visit: Payer: Self-pay | Admitting: Pharmacist

## 2014-04-06 ENCOUNTER — Encounter: Payer: Self-pay | Admitting: Family Medicine

## 2014-04-06 MED ORDER — PHENTERMINE HCL 37.5 MG PO TABS: ORAL_TABLET | ORAL | Status: DC

## 2014-04-11 ENCOUNTER — Encounter: Payer: Self-pay | Admitting: Cardiology

## 2014-05-09 MED ORDER — PHENTERMINE HCL 37.5 MG PO TABS: 37.5000 mg | ORAL_TABLET | Freq: Every day | ORAL | Status: DC

## 2014-06-09 ENCOUNTER — Ambulatory Visit (INDEPENDENT_AMBULATORY_CARE_PROVIDER_SITE_OTHER): Payer: BLUE CROSS/BLUE SHIELD | Admitting: Family Medicine

## 2014-06-09 ENCOUNTER — Encounter: Payer: Self-pay | Admitting: Family Medicine

## 2014-06-09 VITALS — BP 109/64 | HR 76 | Temp 97.2°F | Ht 69.0 in | Wt 232.0 lb

## 2014-06-09 DIAGNOSIS — E785 Hyperlipidemia, unspecified: Secondary | ICD-10-CM

## 2014-06-09 DIAGNOSIS — E119 Type 2 diabetes mellitus without complications: Secondary | ICD-10-CM | POA: Diagnosis not present

## 2014-06-09 DIAGNOSIS — E559 Vitamin D deficiency, unspecified: Secondary | ICD-10-CM

## 2014-06-09 DIAGNOSIS — E669 Obesity, unspecified: Secondary | ICD-10-CM

## 2014-06-09 DIAGNOSIS — N400 Enlarged prostate without lower urinary tract symptoms: Secondary | ICD-10-CM | POA: Diagnosis not present

## 2014-06-09 DIAGNOSIS — K219 Gastro-esophageal reflux disease without esophagitis: Secondary | ICD-10-CM

## 2014-06-09 MED ORDER — PHENTERMINE HCL 37.5 MG PO TABS: 37.5000 mg | ORAL_TABLET | Freq: Every day | ORAL | Status: DC

## 2014-06-09 NOTE — Patient Instructions (Addendum)
Continue current medications. Continue good therapeutic lifestyle changes which include good diet and exercise. Fall precautions discussed with patient. If an FOBT was given today- please return it to our front desk. If you are over 63 years old - you may need Prevnar 25 or the adult Pneumonia vaccine.  Flu Shots are still available at our office. If you still haven't had one please call to set up a nurse visit to get one.   After your visit with [place] today you will receive a survey in the mail or online from [employer] regarding your care with [place]. Please take a moment to fill this out. Your feedback is very important to [place] as you can help [place] better understand your patient needs as well as improve your experience and satisfaction. WE CARE ABOUT YOU!!!    Follow up in 3 mos with Tammy Follow up with Dr Laurance Flatten in 6 mos. Keep monitoring blood sugar and blood pressures. The patient should continue with aggressive therapeutic lifestyle changes and see me in 6 months and the clinical pharmacists in 3 months Once we review the lab work from work we will consider adding metformin to the patient's current medication regimen because of his hemoglobin A1c reported at 6.6. Since this lab work was returned to the patient he has started back on his Crestor 20. And he should continue this. Do not forget to get your eye exam

## 2014-06-09 NOTE — Progress Notes (Signed)
Subjective:    Patient ID: Paul Johnston, male    DOB: 1950-08-10, 64 y.o.   MRN: [id number]  HPI Pt here for follow up and management of chronic medical problems which includes hyperlipidemia and diabetes. He is taking medications regularly. The patient has no specific complaints other some elevated blood sugar readings. He is also had an elevated A1c. He's had outside lab work done and we have not received this cyst. This was done at work. He is in need of an eye exam. His weight is actually diminished from what it was at the last visit.        Patient Active Problem List   Diagnosis Date Noted  . Obesity 12/09/2013  . Elevated blood sugar 12/09/2013  . GERD (gastroesophageal reflux disease) 09/01/2012  . Vitamin D deficiency 09/01/2012  . Solitary pulmonary nodule, history of, left side 09/01/2012  . BPH (benign prostatic hyperplasia) 09/01/2012  . Umbilical hernia 65/04/5463  . Hyperlipidemia 12/14/2008  . SMOKER 12/14/2008   Outpatient Encounter Prescriptions as of 06/09/2014  Medication Sig  . aspirin 81 MG tablet Take 81 mg by mouth daily.  . cholecalciferol (VITAMIN D) 1000 UNITS tablet Take 2,000 Units by mouth daily.   Marland Kitchen esomeprazole (NEXIUM) 40 MG capsule TAKE 1 CAPSULE DAILY AT 12 NOON  . fish oil-omega-3 fatty acids 1000 MG capsule Take 2 g by mouth daily.  Marland Kitchen HYDROcodone-acetaminophen (NORCO) 7.5-325 MG per tablet Take 1 tablet by mouth every 8 (eight) hours as needed for moderate pain.  . phentermine (ADIPEX-P) 37.5 MG tablet Take 1 tablet (37.5 mg total) by mouth daily before breakfast.  . ranitidine (ZANTAC) 150 MG tablet Take 150 mg by mouth as needed for heartburn.  . rosuvastatin (CRESTOR) 40 MG tablet Take 1 tablet (40 mg total) by mouth daily.    Review of Systems  Constitutional: Negative.   HENT: Negative.   Eyes: Negative.   Respiratory: Negative.   Cardiovascular: Negative.   Gastrointestinal: Negative.   Endocrine: Negative.        Elevated BS    Genitourinary: Negative.   Musculoskeletal: Negative.   Skin: Negative.   Allergic/Immunologic: Negative.   Neurological: Negative.   Hematological: Negative.   Psychiatric/Behavioral: Negative.        Objective:   Physical Exam  Constitutional: He is oriented to person, place, and time. He appears well-developed and well-nourished.  The patient is pleasant and alert and with the help of the clinical pharmacists and phentermine has lost 12 pounds since last visit. His blood pressures have been good.  HENT:  Head: Normocephalic and atraumatic.  Right Ear: External ear normal.  Left Ear: External ear normal.  Mouth/Throat: Oropharynx is clear and moist. No oropharyngeal exudate.  Some nasal congestion bilaterally  Eyes: Conjunctivae and EOM are normal. Pupils are equal, round, and reactive to light. Right eye exhibits no discharge. Left eye exhibits no discharge. No scleral icterus.  Neck: Normal range of motion. Neck supple. No thyromegaly present.  No carotid bruits or anterior cervical adenopathy  Cardiovascular: Normal rate, regular rhythm, normal heart sounds and intact distal pulses.  Exam reveals no gallop and no friction rub.   No murmur heard. At 72/m with a regular rate and rhythm  Pulmonary/Chest: Effort normal and breath sounds normal. No respiratory distress. He has no wheezes. He has no rales. He exhibits no tenderness.  Abdominal: Soft. Bowel sounds are normal. He exhibits no mass. There is no tenderness. There is no rebound and no guarding.  Obese without masses or tenderness  Musculoskeletal: Normal range of motion. He exhibits no edema.  Lymphadenopathy:    He has no cervical adenopathy.  Neurological: He is alert and oriented to person, place, and time. He has normal reflexes. No cranial nerve deficit.  Skin: Skin is warm and dry. No rash noted. No erythema. No pallor.  Psychiatric: He has a normal mood and affect. His behavior is normal. Judgment and thought  content normal.  Nursing note and vitals reviewed.  BP 109/64 mmHg  Pulse 76  Temp(Src) 97.2 F (36.2 C) (Oral)  Ht 5\' 9"  (1.753 m)  Wt 232 lb (105.235 kg)  BMI 34.24 kg/m2  We will review the lab work that was done at work when that becomes available      Assessment & Plan:  1. Type 2 diabetes mellitus without complication -Continue to monitor blood sugars regularly -Continue to work on losing weight and drinking more water -Stay active physically  2. Hyperlipidemia -Continue on Crestor 20 which was just restarted by patient.  3. BPH (benign prostatic hyperplasia) -The patient has no complaints with this today.  4. Gastroesophageal reflux disease, esophagitis presence not specified -The patient will continue on Nexium and take a Zantac as needed  5. Vitamin D deficiency -Continue current vitamin D dose pending results of lab work  6. Obesity -For now we will continue to phentermine and we'll give him a monthly supply until he is able to see the pharmacist again in 3 months   Meds ordered this encounter  Medications  . phentermine (ADIPEX-P) 37.5 MG tablet    Sig: Take 1 tablet (37.5 mg total) by mouth daily before breakfast.    Dispense:  30 tablet    Refill:  0   Patient Instructions  Continue current medications. Continue good therapeutic lifestyle changes which include good diet and exercise. Fall precautions discussed with patient. If an FOBT was given today- please return it to our front desk. If you are over 21 years old - you may need Prevnar 15 or the adult Pneumonia vaccine.  Flu Shots are still available at our office. If you still haven't had one please call to set up a nurse visit to get one.   After your visit with [place] today you will receive a survey in the mail or online from [employer] regarding your care with [place]. Please take a moment to fill this out. Your feedback is very important to [place] as you can help [place] better understand your patient needs as  well as improve your experience and satisfaction. WE CARE ABOUT YOU!!!    Follow up in 3 mos with Tammy Follow up with Dr Laurance Flatten in 6 mos. Keep monitoring blood sugar and blood pressures. The patient should continue with aggressive therapeutic lifestyle changes and see me in 6 months and the clinical pharmacists in 3 months Once we review the lab work from work we will consider adding metformin to the patient's current medication regimen because of his hemoglobin A1c reported at 6.6. Since this lab work was returned to the patient he has started back on his Crestor 20. And he should continue this.   Arrie Senate MD

## 2014-06-10 ENCOUNTER — Encounter: Payer: Self-pay | Admitting: Family Medicine

## 2014-06-14 ENCOUNTER — Other Ambulatory Visit: Payer: Self-pay | Admitting: *Deleted

## 2014-06-14 MED ORDER — METFORMIN HCL 500 MG PO TABS: 500.0000 mg | ORAL_TABLET | Freq: Every day | ORAL | Status: DC

## 2014-06-14 MED ORDER — METFORMIN HCL 500 MG PO TABS: 500.0000 mg | ORAL_TABLET | Freq: Two times a day (BID) | ORAL | Status: DC

## 2014-06-14 NOTE — Addendum Note (Signed)
Addended by: Zannie Cove on: 06/14/2014 04:40 PM   Modules accepted: Orders

## 2014-06-14 NOTE — Addendum Note (Signed)
Addended by: Zannie Cove on: 06/14/2014 04:38 PM   Modules accepted: Orders

## 2014-06-22 ENCOUNTER — Encounter: Payer: Self-pay | Admitting: Family Medicine

## 2014-06-22 ENCOUNTER — Encounter: Payer: Self-pay | Admitting: *Deleted

## 2014-07-08 ENCOUNTER — Encounter: Payer: Self-pay | Admitting: Family Medicine

## 2014-07-08 MED ORDER — PHENTERMINE HCL 37.5 MG PO TABS: 37.5000 mg | ORAL_TABLET | Freq: Every day | ORAL | Status: DC

## 2014-07-08 NOTE — Telephone Encounter (Signed)
This may be refilled 1 and the patient will most likely need to be mailed this prescription. Present, I'll sign and you can mail

## 2014-07-08 NOTE — Telephone Encounter (Signed)
Requesting refill on phentermine.

## 2014-07-20 ENCOUNTER — Telehealth: Payer: Self-pay | Admitting: Family Medicine

## 2014-07-20 NOTE — Telephone Encounter (Signed)
Called in phentermine from 07/08/14

## 2014-08-16 ENCOUNTER — Telehealth: Payer: Self-pay | Admitting: Family Medicine

## 2014-08-17 ENCOUNTER — Other Ambulatory Visit: Payer: Self-pay

## 2014-08-17 MED ORDER — PHENTERMINE HCL 37.5 MG PO TABS: 37.5000 mg | ORAL_TABLET | Freq: Every day | ORAL | Status: DC

## 2014-08-17 NOTE — Telephone Encounter (Signed)
Last seen 06/09/14 [facility]  If approved route to nurse to call into [facility] [place]  [phone]

## 2014-08-17 NOTE — Telephone Encounter (Signed)
This is okay to refill 1 

## 2014-09-05 ENCOUNTER — Ambulatory Visit: Payer: BLUE CROSS/BLUE SHIELD

## 2014-09-05 ENCOUNTER — Ambulatory Visit (INDEPENDENT_AMBULATORY_CARE_PROVIDER_SITE_OTHER): Payer: BLUE CROSS/BLUE SHIELD | Admitting: Pharmacist

## 2014-09-05 ENCOUNTER — Encounter: Payer: Self-pay | Admitting: Pharmacist

## 2014-09-05 VITALS — BP 138/80 | HR 78 | Ht 69.0 in | Wt 233.0 lb

## 2014-09-05 DIAGNOSIS — E119 Type 2 diabetes mellitus without complications: Secondary | ICD-10-CM | POA: Diagnosis not present

## 2014-09-05 LAB — POCT GLYCOSYLATED HEMOGLOBIN (HGB A1C): Hemoglobin A1C: 6.3

## 2014-09-05 MED ORDER — PHENTERMINE HCL 37.5 MG PO TABS: 37.5000 mg | ORAL_TABLET | Freq: Every day | ORAL | Status: DC

## 2014-09-05 NOTE — Progress Notes (Signed)
Subjective:    Paul Johnston is a 64 y.o. male who presents for  Re-evaluation of obesity and type 2 diabetes Patient was diagnosed with type 2 DM by A1c of 6.5% 12/09/2013.  At our last visit reviewed dietary changes to help with DM and weight loss.  Patient is still taking phentermine 37.5mg  and takes 1 tablet qam.  He states that it has helped with appetite.  He has lost 25# total since 12/2013  Known diabetic complications: none  Current diabetic medications include:  Metformin 500mg  take 1 tablet daily  Weight trend: reports gainind 60# since he quit smoking in 2010 but has lost 25# over last 9 months. Current diet:  Improving - fewer CHO's especially less bread and sugar containing drinks. Continues to decrease sugar intake but has moments of indiscretion.  Has switched to Pepsi max but sometimes has regular sodas when he eats out. Current exercise: none  Current monitoring regimen: checks 1-2 times per week Any episodes of hypoglycemia? no  Is He on ACE inhibitor or angiotensin II receptor blocker?  No    Cardiovascular risk factors: advanced age (older than 61 for men, 85 for women), diabetes mellitus, dyslipidemia, male gender, obesity (BMI >= 30 kg/m2) and sedentary lifestyle   The following portions of the patient's history were reviewed and updated as appropriate: allergies, current medications, past family history, past medical history, past social history, past surgical history and problem list.  Objective:    A1c = 6.3% today in office (last was 6.6% - 05/31/2014 - checked at work)  Lab Review GLUCOSE, BLD (mg/dL)  Date Value  06/02/2013 99  11/18/2012 91   CO2 (mEq/L)  Date Value  06/02/2013 30  11/18/2012 26   BUN (mg/dL)  Date Value  06/02/2013 18  11/18/2012 14   CREAT (mg/dL)  Date Value  06/02/2013 0.93  11/18/2012 0.82    Assessment:    Diabetes Mellitus type II, controlled with diet Obesity - Has lost 25# total Hyperlipidemia.    Plan:     1.  Rx changes:   Continue phentermine 37.5mg  1 tablet qam.  Continue metformin 500mg  take 1 tablet daily with food. 2.  Education: Reviewed 'ABCs' of diabetes management (respective goals in parentheses):  A1C (<6.5%), blood pressure (<130/80), and cholesterol (LDL <100). 3.  Reviewed CHO counting.  Also addressed decreasing sugar intake.  Suggested taking Crystal Light packets with him to restaurant to avoid regular sodas. 4.   Again recommended increased exercise - start with 10 minutes daily and work toward 30 minutes daily 5.  regular blood sugar monitoring: every other day.  Discussed BG goals 6.  Follow up: 12 weeks with PCP  Cherre Robins, PharmD, CPP, CDE

## 2014-09-08 ENCOUNTER — Ambulatory Visit: Payer: BLUE CROSS/BLUE SHIELD

## 2014-10-21 ENCOUNTER — Other Ambulatory Visit: Payer: Self-pay | Admitting: Pharmacist

## 2014-10-21 ENCOUNTER — Encounter: Payer: Self-pay | Admitting: Family Medicine

## 2014-10-21 MED ORDER — PHENTERMINE HCL 37.5 MG PO TABS: 37.5000 mg | ORAL_TABLET | Freq: Every day | ORAL | Status: DC

## 2014-11-22 ENCOUNTER — Encounter: Payer: Self-pay | Admitting: Family Medicine

## 2014-11-23 ENCOUNTER — Other Ambulatory Visit: Payer: Self-pay | Admitting: Pharmacist

## 2014-11-23 MED ORDER — PHENTERMINE HCL 37.5 MG PO TABS: 37.5000 mg | ORAL_TABLET | Freq: Every day | ORAL | Status: DC

## 2014-12-12 ENCOUNTER — Ambulatory Visit (INDEPENDENT_AMBULATORY_CARE_PROVIDER_SITE_OTHER): Payer: BLUE CROSS/BLUE SHIELD | Admitting: Family Medicine

## 2014-12-12 ENCOUNTER — Encounter: Payer: Self-pay | Admitting: Family Medicine

## 2014-12-12 ENCOUNTER — Ambulatory Visit (INDEPENDENT_AMBULATORY_CARE_PROVIDER_SITE_OTHER): Payer: BLUE CROSS/BLUE SHIELD

## 2014-12-12 VITALS — BP 121/79 | HR 76 | Temp 97.2°F | Ht 69.0 in | Wt 230.0 lb

## 2014-12-12 DIAGNOSIS — Z0000 Encounter for general adult medical examination without abnormal findings: Secondary | ICD-10-CM

## 2014-12-12 DIAGNOSIS — E119 Type 2 diabetes mellitus without complications: Secondary | ICD-10-CM

## 2014-12-12 DIAGNOSIS — E785 Hyperlipidemia, unspecified: Secondary | ICD-10-CM

## 2014-12-12 DIAGNOSIS — E875 Hyperkalemia: Secondary | ICD-10-CM

## 2014-12-12 DIAGNOSIS — E669 Obesity, unspecified: Secondary | ICD-10-CM

## 2014-12-12 DIAGNOSIS — E559 Vitamin D deficiency, unspecified: Secondary | ICD-10-CM

## 2014-12-12 DIAGNOSIS — N400 Enlarged prostate without lower urinary tract symptoms: Secondary | ICD-10-CM

## 2014-12-12 DIAGNOSIS — Z139 Encounter for screening, unspecified: Secondary | ICD-10-CM

## 2014-12-12 MED ORDER — PHENTERMINE HCL 37.5 MG PO TABS: 37.5000 mg | ORAL_TABLET | Freq: Every day | ORAL | Status: DC

## 2014-12-12 NOTE — Progress Notes (Signed)
Subjective:    Patient ID: Paul Johnston, male    DOB: August 19, 1950, 64 y.o.   MRN: [id number]  HPI Patient is here today for a complete physical. The patient is doing well overall. He is due to get his colonoscopy and he will call Dr. Benson Norway to arrange this. He is also due to get an eye exam. He is already had his flu shot. He complains recently of increased gas and some loose bowel movements. He does drink a lot of milk and eats a lot of dairy products. He is past due to his eye exam. He denies chest pain shortness of breath trouble swallowing heartburn indigestion nausea vomiting or blood in the stool. He is passing his water without problems.   Review of Systems  Constitutional: Negative.   HENT: Negative.   Eyes: Negative.   Respiratory: Negative.   Cardiovascular: Negative.   Gastrointestinal: Negative.   Endocrine: Negative.   Genitourinary: Negative.   Musculoskeletal: Negative.   Skin: Negative.   Allergic/Immunologic: Negative.   Neurological: Negative.   Hematological: Negative.   Psychiatric/Behavioral: Negative.           Patient Active Problem List   Diagnosis Date Noted  . Obesity 12/09/2013  . Elevated blood sugar 12/09/2013  . GERD (gastroesophageal reflux disease) 09/01/2012  . Vitamin D deficiency 09/01/2012  . Solitary pulmonary nodule, history of, left side 09/01/2012  . BPH (benign prostatic hyperplasia) 09/01/2012  . Umbilical hernia 03/00/9233  . Hyperlipidemia 12/14/2008  . SMOKER 12/14/2008   Outpatient Encounter Prescriptions as of 12/12/2014  Medication Sig  . aspirin 81 MG tablet Take 81 mg by mouth daily.  . cholecalciferol (VITAMIN D) 1000 UNITS tablet Take 2,000 Units by mouth daily.   Marland Kitchen esomeprazole (NEXIUM) 40 MG capsule TAKE 1 CAPSULE DAILY AT 12 NOON  . fish oil-omega-3 fatty acids 1000 MG capsule Take 2 g by mouth daily.  Marland Kitchen HYDROcodone-acetaminophen (NORCO) 7.5-325 MG per tablet Take 1 tablet by mouth every 8 (eight) hours as needed  for moderate pain.  . metFORMIN (GLUCOPHAGE) 500 MG tablet Take 1 tablet (500 mg total) by mouth daily with breakfast.  . phentermine (ADIPEX-P) 37.5 MG tablet Take 1 tablet (37.5 mg total) by mouth daily before breakfast.  . ranitidine (ZANTAC) 150 MG tablet Take 150 mg by mouth as needed for heartburn.  . rosuvastatin (CRESTOR) 40 MG tablet Take 1 tablet (40 mg total) by mouth daily.   No facility-administered encounter medications on file as of 12/12/2014.       Objective:   Physical Exam  Constitutional: He is oriented to person, place, and time. He appears well-developed and well-nourished. No distress.  HENT:  Head: Normocephalic and atraumatic.  Right Ear: External ear normal.  Left Ear: External ear normal.  Nose: Nose normal.  Mouth/Throat: Oropharynx is clear and moist. No oropharyngeal exudate.  Eyes: Conjunctivae and EOM are normal. Pupils are equal, round, and reactive to light. Right eye exhibits no discharge. Left eye exhibits no discharge. No scleral icterus.  Neck: Normal range of motion. Neck supple. No thyromegaly present.  Cardiovascular: Normal rate, regular rhythm, normal heart sounds and intact distal pulses.   No murmur heard. At 72/m  Pulmonary/Chest: Effort normal and breath sounds normal. No respiratory distress. He has no wheezes. He has no rales. He exhibits no tenderness.  No axillary adenopathy and chest is clear anteriorly and posteriorly  Abdominal: Soft. Bowel sounds are normal. He exhibits no mass. There is no tenderness. There is no  rebound and no guarding.  The abdomen remains obese without organomegaly enlargement masses or tenderness  Genitourinary: Rectum normal and penis normal.  The prostate is enlarged. There are no lumps or masses. There are no rectal masses. The external genitalia were within normal limits and he still has a right inguinal hernia that is palpable  Musculoskeletal: Normal range of motion. He exhibits no edema or tenderness.    Lymphadenopathy:    He has no cervical adenopathy.  Neurological: He is alert and oriented to person, place, and time. He has normal reflexes. No cranial nerve deficit.  Skin: Skin is warm and dry. No rash noted.  Psychiatric: He has a normal mood and affect. His behavior is normal. Judgment and thought content normal.  Nursing note and vitals reviewed.   BP 121/79 mmHg  Pulse 76  Temp(Src) 97.2 F (36.2 C) (Oral)  Ht '5\' 9"'  (1.753 m)  Wt 230 lb (104.327 kg)  BMI 33.95 kg/m2  [facility] reading (PRIMARY) by  Dr. Brunilda Payor x-ray-no active disease                                      Assessment & Plan:  1. Hyperlipidemia -Continue current treatment and as aggressive therapeutic lifestyle changes as possible which include diet and exercise - DG Chest 2 View; Future - BMP8+EGFR; Future - CBC with Differential/Platelet; Future - Lipid panel; Future - Hepatic function panel; Future  2. Type 2 diabetes mellitus without complication, without long-term current use of insulin ([place]) -Continue current treatment and as aggressive therapeutic lifestyle changes as possible. - POCT glycosylated hemoglobin (Hb A1C) - POCT UA - Microscopic Only; Future - POCT urinalysis dipstick; Future - POCT UA - Microalbumin; Future - CBC with Differential/Platelet; Future - POCT glycosylated hemoglobin (Hb A1C); Future  3. Vitamin D deficiency -Continue current treatment  4. BPH (benign prostatic hyperplasia) -The patient is having no symptoms with this and there were no abnormal findings other than an enlarged prostate gland and a right inguinal hernia  5. Obesity (BMI 30-39.9) -He must continue to work aggressively with his diet and exercise regimen to get his BMI down lower. Today it is 33.95.  6. Annual physical exam -The patient should check with his insurance regarding the shingles shot. -He will call his gastroenterologist and arrange for his colonoscopy which is due -He needs to get an eye  exam and he is aware of this. -He will reduce his caffeine intake and his milk and dairy products intake to see if this helps the bloating gas and loose bowel movements. -He will also consider trying a probiotic if this does not work -He should discuss these findings and symptoms with his gastroenterologist.  Meds ordered this encounter  Medications  . phentermine (ADIPEX-P) 37.5 MG tablet    Sig: Take 1 tablet (37.5 mg total) by mouth daily before breakfast.    Dispense:  30 tablet    Refill:  0   Patient Instructions  Reduce intake of Milk and dairy products Decrease caffeine Decrease salt intake Increase exercise  Make sure to schedule colonoscopy with Dr. Benson Norway  Get OTC Probiotic to help with stomach--- align Check with insurance to see if they will cover zostavax (shingles) Make sure to return FOBT  Labs have been order and make sure to return to office for your labs.    Arrie Senate MD

## 2014-12-12 NOTE — Patient Instructions (Addendum)
Reduce intake of Milk and dairy products Decrease caffeine Decrease salt intake Increase exercise  Make sure to schedule colonoscopy with Dr. Benson Norway  Get OTC Probiotic to help with stomach--- align Check with insurance to see if they will cover zostavax (shingles) Make sure to return FOBT  Labs have been order and make sure to return to office for your labs.

## 2014-12-16 ENCOUNTER — Encounter (INDEPENDENT_AMBULATORY_CARE_PROVIDER_SITE_OTHER): Payer: Self-pay | Admitting: *Deleted

## 2014-12-20 ENCOUNTER — Telehealth: Payer: Self-pay | Admitting: Family Medicine

## 2014-12-20 ENCOUNTER — Other Ambulatory Visit: Payer: Self-pay | Admitting: Family Medicine

## 2014-12-21 NOTE — Telephone Encounter (Signed)
Stp and advised will leave stool card for him to pick up on Saturday when he has his labs drawn.

## 2014-12-24 ENCOUNTER — Other Ambulatory Visit (INDEPENDENT_AMBULATORY_CARE_PROVIDER_SITE_OTHER): Payer: BLUE CROSS/BLUE SHIELD

## 2014-12-24 DIAGNOSIS — Z139 Encounter for screening, unspecified: Secondary | ICD-10-CM

## 2014-12-24 DIAGNOSIS — E119 Type 2 diabetes mellitus without complications: Secondary | ICD-10-CM

## 2014-12-24 DIAGNOSIS — E785 Hyperlipidemia, unspecified: Secondary | ICD-10-CM

## 2014-12-24 DIAGNOSIS — E875 Hyperkalemia: Secondary | ICD-10-CM

## 2014-12-27 ENCOUNTER — Other Ambulatory Visit: Payer: BLUE CROSS/BLUE SHIELD

## 2014-12-27 LAB — POCT URINALYSIS DIPSTICK
BILIRUBIN UA: NEGATIVE
Blood, UA: NEGATIVE
GLUCOSE UA: NEGATIVE
KETONES UA: NEGATIVE
LEUKOCYTES UA: NEGATIVE
NITRITE UA: NEGATIVE
Protein, UA: NEGATIVE
Spec Grav, UA: <=1.005
Urobilinogen, UA: NEGATIVE
pH, UA: 6

## 2014-12-27 LAB — POCT UA - MICROSCOPIC ONLY
BACTERIA, U MICROSCOPIC: NEGATIVE
CASTS, UR, LPF, POC: NEGATIVE
Crystals, Ur, HPF, POC: NEGATIVE
MUCUS UA: NEGATIVE
RBC, urine, microscopic: NEGATIVE
WBC, Ur, HPF, POC: NEGATIVE
Yeast, UA: NEGATIVE

## 2014-12-27 LAB — POCT GLYCOSYLATED HEMOGLOBIN (HGB A1C): Hemoglobin A1C: 6.3

## 2014-12-28 LAB — BMP8+EGFR
BUN / CREAT RATIO: 12 (ref 10–22)
BUN: 12 mg/dL (ref 8–27)
CO2: 26 mmol/L (ref 18–29)
CREATININE: 1.02 mg/dL (ref 0.76–1.27)
Calcium: 9.5 mg/dL (ref 8.6–10.2)
Chloride: 98 mmol/L (ref 97–106)
GFR calc Af Amer: 90 mL/min/{1.73_m2} (ref >59)
GFR, EST NON AFRICAN AMERICAN: 78 mL/min/{1.73_m2} (ref >59)
Glucose: 130 mg/dL — ABNORMAL HIGH (ref 65–99)
Potassium: 5.4 mmol/L — ABNORMAL HIGH (ref 3.5–5.2)
SODIUM: 140 mmol/L (ref 136–144)

## 2014-12-28 LAB — HEPATIC FUNCTION PANEL
ALT: 29 IU/L (ref 0–44)
AST: 29 IU/L (ref 0–40)
Albumin: 4.2 g/dL (ref 3.6–4.8)
Alkaline Phosphatase: 46 IU/L (ref 39–117)
BILIRUBIN TOTAL: 0.7 mg/dL (ref 0.0–1.2)
Bilirubin, Direct: 0.23 mg/dL (ref 0.00–0.40)
Total Protein: 7 g/dL (ref 6.0–8.5)

## 2014-12-28 LAB — LIPID PANEL
CHOL/HDL RATIO: 3 ratio (ref 0.0–5.0)
Cholesterol, Total: 121 mg/dL (ref 100–199)
HDL: 41 mg/dL (ref >39)
LDL CALC: 66 mg/dL (ref 0–99)
TRIGLYCERIDES: 68 mg/dL (ref 0–149)
VLDL Cholesterol Cal: 14 mg/dL (ref 5–40)

## 2014-12-28 LAB — MICROALBUMIN, URINE: Microalbumin, Urine: <3 ug/mL

## 2014-12-28 NOTE — Addendum Note (Signed)
Addended by: Nigel Berthold C on: 12/28/2014 11:23 AM   Modules accepted: Orders

## 2014-12-28 NOTE — Addendum Note (Signed)
Addended by: Nigel Berthold C on: 12/28/2014 11:30 AM   Modules accepted: Orders

## 2014-12-30 NOTE — Progress Notes (Signed)
Pt aware of all results. 

## 2015-01-11 ENCOUNTER — Other Ambulatory Visit: Payer: BLUE CROSS/BLUE SHIELD

## 2015-01-11 DIAGNOSIS — E875 Hyperkalemia: Secondary | ICD-10-CM

## 2015-01-11 DIAGNOSIS — Z139 Encounter for screening, unspecified: Secondary | ICD-10-CM

## 2015-01-12 LAB — BMP8+EGFR
BUN / CREAT RATIO: 15 (ref 10–22)
BUN: 15 mg/dL (ref 8–27)
CALCIUM: 9.3 mg/dL (ref 8.6–10.2)
CHLORIDE: 100 mmol/L (ref 97–106)
CO2: 28 mmol/L (ref 18–29)
Creatinine, Ser: 1.03 mg/dL (ref 0.76–1.27)
GFR, EST AFRICAN AMERICAN: 89 mL/min/{1.73_m2} (ref >59)
GFR, EST NON AFRICAN AMERICAN: 77 mL/min/{1.73_m2} (ref >59)
Glucose: 97 mg/dL (ref 65–99)
POTASSIUM: 4.7 mmol/L (ref 3.5–5.2)
SODIUM: 141 mmol/L (ref 136–144)

## 2015-01-12 LAB — HEPATITIS C ANTIBODY: Hep C Virus Ab: 0.2 s/co ratio (ref 0.0–0.9)

## 2015-01-18 ENCOUNTER — Other Ambulatory Visit: Payer: BLUE CROSS/BLUE SHIELD

## 2015-01-18 DIAGNOSIS — Z1212 Encounter for screening for malignant neoplasm of rectum: Secondary | ICD-10-CM

## 2015-01-18 LAB — HM DIABETES EYE EXAM

## 2015-01-19 ENCOUNTER — Encounter: Payer: Self-pay | Admitting: Family Medicine

## 2015-01-20 LAB — FECAL OCCULT BLOOD, IMMUNOCHEMICAL: FECAL OCCULT BLD: NEGATIVE

## 2015-03-19 ENCOUNTER — Encounter: Payer: Self-pay | Admitting: Family Medicine

## 2015-03-20 NOTE — Telephone Encounter (Signed)
I spoke with patient and advised him of recommendations

## 2015-03-24 ENCOUNTER — Encounter: Payer: Self-pay | Admitting: Pharmacist

## 2015-03-27 ENCOUNTER — Encounter: Payer: Self-pay | Admitting: Family Medicine

## 2015-03-28 MED ORDER — ROSUVASTATIN CALCIUM 40 MG PO TABS
40.0000 mg | ORAL_TABLET | Freq: Every day | ORAL | Status: DC
Start: 2015-03-28 — End: 2016-01-11

## 2015-05-16 DIAGNOSIS — E119 Type 2 diabetes mellitus without complications: Secondary | ICD-10-CM

## 2015-05-16 DIAGNOSIS — E1169 Type 2 diabetes mellitus with other specified complication: Secondary | ICD-10-CM | POA: Insufficient documentation

## 2015-05-17 ENCOUNTER — Encounter: Payer: Self-pay | Admitting: Family Medicine

## 2015-05-17 ENCOUNTER — Ambulatory Visit (INDEPENDENT_AMBULATORY_CARE_PROVIDER_SITE_OTHER): Payer: BLUE CROSS/BLUE SHIELD | Admitting: Family Medicine

## 2015-05-17 VITALS — BP 136/87 | HR 81 | Temp 97.9°F | Ht 69.0 in | Wt 240.0 lb

## 2015-05-17 DIAGNOSIS — E669 Obesity, unspecified: Secondary | ICD-10-CM | POA: Insufficient documentation

## 2015-05-17 DIAGNOSIS — E785 Hyperlipidemia, unspecified: Secondary | ICD-10-CM | POA: Diagnosis not present

## 2015-05-17 DIAGNOSIS — K219 Gastro-esophageal reflux disease without esophagitis: Secondary | ICD-10-CM | POA: Diagnosis not present

## 2015-05-17 DIAGNOSIS — E559 Vitamin D deficiency, unspecified: Secondary | ICD-10-CM | POA: Diagnosis not present

## 2015-05-17 DIAGNOSIS — N400 Enlarged prostate without lower urinary tract symptoms: Secondary | ICD-10-CM | POA: Diagnosis not present

## 2015-05-17 DIAGNOSIS — J069 Acute upper respiratory infection, unspecified: Secondary | ICD-10-CM | POA: Diagnosis not present

## 2015-05-17 DIAGNOSIS — E119 Type 2 diabetes mellitus without complications: Secondary | ICD-10-CM | POA: Diagnosis not present

## 2015-05-17 LAB — BAYER DCA HB A1C WAIVED: HB A1C (BAYER DCA - WAIVED): 6.3 % (ref <7.0)

## 2015-05-17 MED ORDER — AMOXICILLIN 500 MG PO CAPS: 500.0000 mg | ORAL_CAPSULE | Freq: Three times a day (TID) | ORAL | Status: DC

## 2015-05-17 MED ORDER — FLUTICASONE PROPIONATE 50 MCG/ACT NA SUSP: 2.0000 | Freq: Every day | NASAL | Status: DC

## 2015-05-17 MED ORDER — PHENTERMINE HCL 37.5 MG PO TABS: 37.5000 mg | ORAL_TABLET | Freq: Every day | ORAL | Status: DC

## 2015-05-17 NOTE — Patient Instructions (Addendum)
Continue current medications. Continue good therapeutic lifestyle changes which include good diet and exercise. Fall precautions discussed with patient. If an FOBT was given today- please return it to our front desk. If you are over 64 years old - you may need Prevnar 23 or the adult Pneumonia vaccine.  **Flu shots are available--- please call and schedule a FLU-CLINIC appointment**  After your visit with [place] today you will receive a survey in the mail or online from [employer] regarding your care with [place]. Please take a moment to fill this out. Your feedback is very important to [place] as you can help [place] better understand your patient needs as well as improve your experience and satisfaction. WE CARE ABOUT YOU!!!   The patient should try to check his blood sugars more regularly When the hemoglobin A1c is returned we will start a different medicine than metformin because of the loose stools that were associated with the metformin The patient should take his antibiotic regularly and until completed He should use Flonase nasal spray 1-2 sprays each nostril daily at bedtime He should use nasal saline frequently during the day in each nostril He should take Mucinex if needed for cough and congestion twice daily with a large glass of water He should take Claritin once daily for allergy Should continue to work with his diet to help lose weight in conjunction with the phentermine. He should make sure that we get a copy of his lab work from his workplace

## 2015-05-17 NOTE — Progress Notes (Signed)
Subjective:    Patient ID: Paul Johnston, male    DOB: [birth year], 64 y.o.   MRN: [id number]  HPI Pt here for follow up and management of chronic medical problems which includes diabetes and hyperlipidemia. He is taking medications regularly.The patient today complains of head congestion and drainage and sinus pressure. The patient had his eye exam done at my eye doctor in [facility] in December. He had a colonoscopy done in January 2017. He had a chest x-ray done in November 16 which was read as normal. He is due to get lab work and he will have this done at work and have a send a copy of this. The head congestion that the patient has had has been severe over the past 3 weeks. He feels like it times he's been running a low-grade fever. He has a lot of pressure and congestion mostly in his head but also has some cough. He does not think this is allergy. He denies any chest pain. His shortness of breath is associated with his nasal congestion and not being able to breathe well. He is having no problems with his reflux. He denies any heartburn nausea vomiting diarrhea or blood in the stool. He did have diarrhea with the metformin and he has been off of this since January and not taking any medicine for his blood sugar. His colonoscopy was clear and he said it needed to be repeated in 10 years. He denies any problems passing his water. He continues to have his obesity.     Patient Active Problem List   Diagnosis Date Noted  . Diabetes mellitus without complication ([place])   . Obesity 12/09/2013  . Elevated blood sugar 12/09/2013  . GERD (gastroesophageal reflux disease) 09/01/2012  . Vitamin D deficiency 09/01/2012  . Solitary pulmonary nodule, history of, left side 09/01/2012  . BPH (benign prostatic hyperplasia) 09/01/2012  . Umbilical hernia [id number]  . Hyperlipidemia 12/14/2008  . SMOKER 12/14/2008   Outpatient Encounter Prescriptions as of 05/17/2015  Medication Sig  . aspirin 81  MG tablet Take 81 mg by mouth daily.  . cholecalciferol (VITAMIN D) 1000 UNITS tablet Take 2,000 Units by mouth daily.   Marland Kitchen esomeprazole (NEXIUM) 40 MG capsule TAKE 1 CAPSULE DAILY AT 12 NOON  . fish oil-omega-3 fatty acids 1000 MG capsule Take 2 g by mouth daily.  Marland Kitchen HYDROcodone-acetaminophen (NORCO) 7.5-325 MG per tablet Take 1 tablet by mouth every 8 (eight) hours as needed for moderate pain.  . ranitidine (ZANTAC) 150 MG tablet Take 150 mg by mouth as needed for heartburn.  . rosuvastatin (CRESTOR) 40 MG tablet Take 1 tablet (40 mg total) by mouth daily.  . [DISCONTINUED] metFORMIN (GLUCOPHAGE) 500 MG tablet Take 1 tablet (500 mg total) by mouth daily with breakfast.  . [DISCONTINUED] phentermine (ADIPEX-P) 37.5 MG tablet Take 1 tablet (37.5 mg total) by mouth daily before breakfast.   No facility-administered encounter medications on file as of 05/17/2015.      Review of Systems  Constitutional: Negative.   HENT: Positive for congestion (head), postnasal drip and sinus pressure.   Eyes: Negative.   Respiratory: Negative.   Cardiovascular: Negative.   Gastrointestinal: Negative.   Endocrine: Negative.   Genitourinary: Negative.   Musculoskeletal: Negative.   Skin: Negative.   Allergic/Immunologic: Negative.   Neurological: Negative.   Hematological: Negative.   Psychiatric/Behavioral: Negative.        Objective:   Physical Exam  Constitutional: He is oriented to person, place, and  time. He appears well-developed and well-nourished. No distress.  HENT:  Head: Normocephalic and atraumatic.  Right Ear: External ear normal.  Left Ear: External ear normal.  Mouth/Throat: Oropharynx is clear and moist. No oropharyngeal exudate.  Nasal congestion bilaterally  Eyes: Conjunctivae and EOM are normal. Pupils are equal, round, and reactive to light. Right eye exhibits no discharge. Left eye exhibits no discharge. No scleral icterus.  Neck: Normal range of motion. Neck supple. No  tracheal deviation present. No thyromegaly present.  Cardiovascular: Normal rate, regular rhythm, normal heart sounds and intact distal pulses.   No murmur heard. The rhythm is normal.  Pulmonary/Chest: Effort normal and breath sounds normal. No respiratory distress. He has no wheezes. He has no rales. He exhibits no tenderness.  Minimal tightness with coughing no wheezing or rhonchi or rales  Abdominal: Soft. Bowel sounds are normal. He exhibits no mass. There is no tenderness. There is no rebound and no guarding.  Nontender without masses or organ enlargement  Musculoskeletal: Normal range of motion. He exhibits no edema.  Lymphadenopathy:    He has cervical adenopathy (the cervical adenopathy is on the right side. He is tender bilaterally.).  Neurological: He is alert and oriented to person, place, and time. No cranial nerve deficit.  Skin: Skin is warm and dry. No rash noted.  Psychiatric: He has a normal mood and affect. His behavior is normal. Judgment and thought content normal.  Nursing note and vitals reviewed.  BP 136/87 mmHg  Pulse 81  Temp(Src) 97.9 F (36.6 C) (Oral)  Ht 5\' 9"  (1.753 m)  Wt 240 lb (108.863 kg)  BMI 35.43 kg/m2        Assessment & Plan:  1. Hyperlipidemia -Continue Crestor and aggressive therapeutic lifestyle changes pending results of lab work  2. Type 2 diabetes mellitus without complication, without long-term current use of insulin ([facility]) -The patient has been intolerant of metformin and we will wait till A1c is returned before we start a new treatment for his blood sugar - Bayer DCA Hb A1c Waived  3. Vitamin D deficiency -Continue current treatment pending results of lab work  4. BPH (benign prostatic hyperplasia) -The patient is having no symptoms with passing his water.  5. Diabetes mellitus without complication ([place]) -The patient has not been checking his blood sugars at home because his machine is not working properly. He also stopped his  metformin in January because the gastroenterologist said it was playing a role in causing loose bowel movements. He has not been taking any medicine for his blood sugar. We will wait until his hemoglobin A1c is returned and we'll consider starting him on a medication like Januvia.  6. Gastroesophageal reflux disease, esophagitis presence not specified -He continues to take Nexium is having no problems with reflux.  7. URI (upper respiratory infection) -Take amoxicillin as directed. Use Flonase Claritin and Mucinex to treat symptoms. Use Flonase regularly for allergic rhinitis  8. Obesity (BMI 30-39.9) -A prescription for phentermine was given. He has not had this since late 2016. He will work with his diet closely and try to get more exercise in conjunction with taking this medication.  Meds ordered this encounter  Medications  . amoxicillin (AMOXIL) 500 MG capsule    Sig: Take 1 capsule (500 mg total) by mouth 3 (three) times daily.    Dispense:  30 capsule    Refill:  0  . fluticasone (FLONASE) 50 MCG/ACT nasal spray    Sig: Place 2 sprays into both  nostrils daily.    Dispense:  16 g    Refill:  6   Patient Instructions  Continue current medications. Continue good therapeutic lifestyle changes which include good diet and exercise. Fall precautions discussed with patient. If an FOBT was given today- please return it to our front desk. If you are over 15 years old - you may need Prevnar 91 or the adult Pneumonia vaccine.  **Flu shots are available--- please call and schedule a FLU-CLINIC appointment**  After your visit with [place] today you will receive a survey in the mail or online from [employer] regarding your care with [place]. Please take a moment to fill this out. Your feedback is very important to [place] as you can help [place] better understand your patient needs as well as improve your experience and satisfaction. WE CARE ABOUT YOU!!!   The patient should try to check his blood sugars more  regularly When the hemoglobin A1c is returned we will start a different medicine than metformin because of the loose stools that were associated with the metformin The patient should take his antibiotic regularly and until completed He should use Flonase nasal spray 1-2 sprays each nostril daily at bedtime He should use nasal saline frequently during the day in each nostril He should take Mucinex if needed for cough and congestion twice daily with a large glass of water He should take Claritin once daily for allergy Should continue to work with his diet to help lose weight in conjunction with the phentermine. He should make sure that we get a copy of his lab work from his workplace   Arrie Senate MD

## 2015-05-17 NOTE — Addendum Note (Signed)
Addended by: Zannie Cove on: 05/17/2015 04:40 PM   Modules accepted: Orders, SmartSet

## 2015-05-21 ENCOUNTER — Encounter: Payer: Self-pay | Admitting: Family Medicine

## 2015-05-22 MED ORDER — GLUCOSE BLOOD VI STRP: ORAL_STRIP | Status: DC

## 2015-05-23 ENCOUNTER — Other Ambulatory Visit: Payer: Self-pay | Admitting: *Deleted

## 2015-05-23 MED ORDER — ONETOUCH ULTRASOFT LANCETS MISC
Status: AC
Start: 2015-05-23 — End: ?

## 2015-06-04 ENCOUNTER — Encounter: Payer: Self-pay | Admitting: Family Medicine

## 2015-06-09 ENCOUNTER — Other Ambulatory Visit: Payer: Self-pay | Admitting: *Deleted

## 2015-06-09 ENCOUNTER — Other Ambulatory Visit: Payer: BLUE CROSS/BLUE SHIELD

## 2015-06-09 DIAGNOSIS — E669 Obesity, unspecified: Secondary | ICD-10-CM

## 2015-06-09 DIAGNOSIS — E785 Hyperlipidemia, unspecified: Secondary | ICD-10-CM

## 2015-06-09 DIAGNOSIS — R739 Hyperglycemia, unspecified: Secondary | ICD-10-CM

## 2015-06-09 DIAGNOSIS — K219 Gastro-esophageal reflux disease without esophagitis: Secondary | ICD-10-CM

## 2015-06-09 DIAGNOSIS — E119 Type 2 diabetes mellitus without complications: Secondary | ICD-10-CM

## 2015-06-09 DIAGNOSIS — E559 Vitamin D deficiency, unspecified: Secondary | ICD-10-CM

## 2015-06-10 LAB — CBC WITH DIFFERENTIAL/PLATELET
BASOS ABS: 0.1 10*3/uL (ref 0.0–0.2)
BASOS: 1 %
EOS (ABSOLUTE): 0.3 10*3/uL (ref 0.0–0.4)
EOS: 3 %
Hematocrit: 47.7 % (ref 37.5–51.0)
Hemoglobin: 15.1 g/dL (ref 12.6–17.7)
Immature Grans (Abs): 0 10*3/uL (ref 0.0–0.1)
Immature Granulocytes: 0 %
LYMPHS ABS: 2.7 10*3/uL (ref 0.7–3.1)
Lymphs: 28 %
MCH: 28.1 pg (ref 26.6–33.0)
MCHC: 31.7 g/dL (ref 31.5–35.7)
MCV: 89 fL (ref 79–97)
MONOCYTES: 6 %
MONOS ABS: 0.6 10*3/uL (ref 0.1–0.9)
NEUTROS PCT: 62 %
Neutrophils Absolute: 5.9 10*3/uL (ref 1.4–7.0)
Platelets: 235 10*3/uL (ref 150–379)
RBC: 5.38 x10E6/uL (ref 4.14–5.80)
RDW: 14.2 % (ref 12.3–15.4)
WBC: 9.6 10*3/uL (ref 3.4–10.8)

## 2015-06-10 LAB — LIPID PANEL
Chol/HDL Ratio: 3 ratio units (ref 0.0–5.0)
Cholesterol, Total: 120 mg/dL (ref 100–199)
HDL: 40 mg/dL (ref >39)
LDL Calculated: 60 mg/dL (ref 0–99)
TRIGLYCERIDES: 100 mg/dL (ref 0–149)
VLDL CHOLESTEROL CAL: 20 mg/dL (ref 5–40)

## 2015-06-10 LAB — HEPATIC FUNCTION PANEL
ALBUMIN: 4.4 g/dL (ref 3.6–4.8)
ALT: 21 IU/L (ref 0–44)
AST: 24 IU/L (ref 0–40)
Alkaline Phosphatase: 45 IU/L (ref 39–117)
Bilirubin Total: 0.7 mg/dL (ref 0.0–1.2)
Bilirubin, Direct: 0.22 mg/dL (ref 0.00–0.40)
Total Protein: 7.2 g/dL (ref 6.0–8.5)

## 2015-06-10 LAB — BMP8+EGFR
BUN / CREAT RATIO: 10 (ref 10–24)
BUN: 10 mg/dL (ref 8–27)
CO2: 25 mmol/L (ref 18–29)
CREATININE: 1.04 mg/dL (ref 0.76–1.27)
Calcium: 10 mg/dL (ref 8.6–10.2)
Chloride: 100 mmol/L (ref 96–106)
GFR calc Af Amer: 88 mL/min/{1.73_m2} (ref >59)
GFR, EST NON AFRICAN AMERICAN: 76 mL/min/{1.73_m2} (ref >59)
GLUCOSE: 104 mg/dL — AB (ref 65–99)
Potassium: 5.8 mmol/L — ABNORMAL HIGH (ref 3.5–5.2)
Sodium: 144 mmol/L (ref 134–144)

## 2015-06-10 LAB — VITAMIN D 25 HYDROXY (VIT D DEFICIENCY, FRACTURES): Vit D, 25-Hydroxy: 38.7 ng/mL (ref 30.0–100.0)

## 2015-06-13 ENCOUNTER — Telehealth: Payer: Self-pay | Admitting: Family Medicine

## 2015-06-14 ENCOUNTER — Encounter: Payer: Self-pay | Admitting: Family Medicine

## 2015-06-14 ENCOUNTER — Other Ambulatory Visit: Payer: Self-pay | Admitting: *Deleted

## 2015-06-14 DIAGNOSIS — R972 Elevated prostate specific antigen [PSA]: Secondary | ICD-10-CM

## 2015-06-14 DIAGNOSIS — E875 Hyperkalemia: Secondary | ICD-10-CM

## 2015-06-16 ENCOUNTER — Other Ambulatory Visit: Payer: BLUE CROSS/BLUE SHIELD

## 2015-06-16 DIAGNOSIS — E875 Hyperkalemia: Secondary | ICD-10-CM

## 2015-06-16 DIAGNOSIS — R972 Elevated prostate specific antigen [PSA]: Secondary | ICD-10-CM

## 2015-06-17 LAB — BMP8+EGFR
BUN/Creatinine Ratio: 13 (ref 10–24)
BUN: 13 mg/dL (ref 8–27)
CALCIUM: 9.3 mg/dL (ref 8.6–10.2)
CO2: 25 mmol/L (ref 18–29)
CREATININE: 1.01 mg/dL (ref 0.76–1.27)
Chloride: 96 mmol/L (ref 96–106)
GFR calc non Af Amer: 79 mL/min/{1.73_m2} (ref >59)
GFR, EST AFRICAN AMERICAN: 91 mL/min/{1.73_m2} (ref >59)
Glucose: 111 mg/dL — ABNORMAL HIGH (ref 65–99)
Potassium: 4.7 mmol/L (ref 3.5–5.2)
Sodium: 137 mmol/L (ref 134–144)

## 2015-06-17 LAB — PSA, TOTAL AND FREE
PROSTATE SPECIFIC AG, SERUM: 0.7 ng/mL (ref 0.0–4.0)
PSA, Free Pct: 48.6 %
PSA, Free: 0.34 ng/mL

## 2015-06-20 ENCOUNTER — Encounter: Payer: Self-pay | Admitting: Family Medicine

## 2015-06-22 ENCOUNTER — Telehealth: Payer: Self-pay | Admitting: Family Medicine

## 2015-06-22 NOTE — Telephone Encounter (Signed)
Patient viewed results via my chart

## 2015-06-22 NOTE — Telephone Encounter (Signed)
I will have to discuss with his PCP about refilling. Patient has not seen me since July 2016. Patient was notified of above - Dr Laurance Flatten will be here again May 15th.  Patient was upset that this could not be set up to be filled monthly - explained that BP / HR and weight should be followed while taking phentermine and that it is a controlled medication.

## 2015-06-23 NOTE — Telephone Encounter (Signed)
Please find out from the clinical pharmacist the protocol that she follows for prescribing phentermine and we will see if we can incorporate that into visits between her and me so that the patient does not have to come as often--- and then we can let him know this. He has to understand that this is a controlled drug and that it requires periodic management with weights and vital signs. We not try to make it hard on him.

## 2015-06-23 NOTE — Telephone Encounter (Signed)
Please set up appointment with Tammy to be followed for the phentermine

## 2015-06-23 NOTE — Telephone Encounter (Signed)
Patient advised of recommendations and he states that he is just not going to take it anymore. He states that Dr. Laurance Flatten is his Dr and he should not have to see someone else in the same practice for this medication. Patient was very upset.

## 2015-06-26 NOTE — Telephone Encounter (Signed)
Spoke with patient and discussed that since phentermine was a controlled medication he would need to be seen every 3 months.  He states that he is unable to do this for cost and work reasons.  He would like an alternative to phentermine is there is one that would not require this.  Will discuss with Dr Manual Meier 06/28/15 when he returns.

## 2015-06-29 NOTE — Telephone Encounter (Signed)
No controlled options for weight loss are [place] or Contrave.  These are both brand medications and may or may not be covered by insurance.  I tried to call insurance ([place]) that we have on hand and they could not find active coverage (patient may have different pharmacy benefits) - will try to contact his pharmacy to see if they have more information.

## 2015-07-03 ENCOUNTER — Ambulatory Visit (INDEPENDENT_AMBULATORY_CARE_PROVIDER_SITE_OTHER): Payer: BLUE CROSS/BLUE SHIELD | Admitting: Physician Assistant

## 2015-07-03 ENCOUNTER — Encounter: Payer: Self-pay | Admitting: Physician Assistant

## 2015-07-03 VITALS — BP 134/67 | HR 80 | Temp 97.6°F | Ht 69.0 in | Wt 239.6 lb

## 2015-07-03 DIAGNOSIS — J209 Acute bronchitis, unspecified: Secondary | ICD-10-CM | POA: Diagnosis not present

## 2015-07-03 MED ORDER — METHYLPREDNISOLONE ACETATE 80 MG/ML IJ SUSP
80.0000 mg | Freq: Once | INTRAMUSCULAR | Status: AC
  Administered 2015-07-03: 80 mg via INTRAMUSCULAR

## 2015-07-03 MED ORDER — AMOXICILLIN-POT CLAVULANATE 875-125 MG PO TABS: 1.0000 | ORAL_TABLET | Freq: Two times a day (BID) | ORAL | Status: DC

## 2015-07-03 NOTE — Addendum Note (Signed)
Addended by: Jamelle Haring on: 07/03/2015 02:51 PM   Modules accepted: Miquel Dunn

## 2015-07-03 NOTE — Progress Notes (Signed)
Subjective:     Patient ID: Paul Johnston, male   DOB: 01-20-1951, 65 y.o.   MRN: [id number]  HPI Pt with prod cough and congestion He was on Amox 500mg  tid ~ 6 weeks ago and did well His wife then came home sick and he has now caught it  Review of Systems  Constitutional: Positive for activity change and fatigue.  HENT: Positive for congestion, postnasal drip, sinus pressure and sore throat. Negative for ear discharge, ear pain, nosebleeds, rhinorrhea and sneezing.   Respiratory: Positive for cough and wheezing. Negative for choking and chest tightness.   Cardiovascular: Negative.        Objective:   Physical Exam  Constitutional: He appears well-developed and well-nourished.  HENT:  Right Ear: External ear normal.  Left Ear: External ear normal.  Mouth/Throat: Oropharynx is clear and moist. No oropharyngeal exudate.  Neck: Neck supple.  Cardiovascular: Normal rate, regular rhythm and normal heart sounds.   No murmur heard. Pulmonary/Chest: Effort normal. No respiratory distress. He has wheezes.  Lymphadenopathy:    He has no cervical adenopathy.  Nursing note and vitals reviewed.      Assessment:     1. Acute bronchitis, unspecified organism        Plan:     Fluids Rest Augmentin 875mg  bid x 10 days DepoMedrol 80mg  IM today Informed he might see a sl increase in BS readings Work note F/U prn

## 2015-07-03 NOTE — Patient Instructions (Signed)

## 2015-07-06 MED ORDER — PHENTERMINE HCL 37.5 MG PO CAPS: 37.5000 mg | ORAL_CAPSULE | ORAL | Status: DC

## 2015-07-06 NOTE — Telephone Encounter (Signed)
Called pharmacy and got PBM information.  Sexenda and Contrave were both over $100 cost to patient.  He did not want to do either of these.  I did call in 1 rx for #30 of phentermine (patient will take X 3 months and then off X 3 months) .

## 2015-07-07 ENCOUNTER — Other Ambulatory Visit: Payer: Self-pay | Admitting: Family Medicine

## 2015-07-11 NOTE — Telephone Encounter (Signed)
Last seen 07/03/15  [facility]   [facility] PCP  If approved route to nurse to call into [facility]  [phone]

## 2015-07-11 NOTE — Telephone Encounter (Signed)
Phoned in - was approved on 5/25 and was never called in

## 2015-08-08 ENCOUNTER — Encounter: Payer: Self-pay | Admitting: Family Medicine

## 2015-08-08 ENCOUNTER — Other Ambulatory Visit: Payer: Self-pay | Admitting: Pharmacist

## 2015-08-08 MED ORDER — PHENTERMINE HCL 37.5 MG PO CAPS: 37.5000 mg | ORAL_CAPSULE | ORAL | Status: DC

## 2015-08-08 NOTE — Telephone Encounter (Signed)
Phentermine #30 called into [facility].

## 2015-09-01 ENCOUNTER — Encounter: Payer: Self-pay | Admitting: Family Medicine

## 2015-09-04 ENCOUNTER — Other Ambulatory Visit: Payer: Self-pay | Admitting: Pharmacist

## 2015-09-04 MED ORDER — PHENTERMINE HCL 37.5 MG PO CAPS: 37.5000 mg | ORAL_CAPSULE | ORAL | 0 refills | Status: DC

## 2015-11-02 ENCOUNTER — Encounter: Payer: Self-pay | Admitting: Family Medicine

## 2015-11-03 ENCOUNTER — Other Ambulatory Visit: Payer: Self-pay | Admitting: Family Medicine

## 2015-12-18 ENCOUNTER — Other Ambulatory Visit: Payer: BLUE CROSS/BLUE SHIELD | Admitting: Family Medicine

## 2015-12-20 ENCOUNTER — Other Ambulatory Visit: Payer: BLUE CROSS/BLUE SHIELD | Admitting: Family Medicine

## 2015-12-21 ENCOUNTER — Encounter: Payer: Self-pay | Admitting: Family Medicine

## 2016-01-08 ENCOUNTER — Other Ambulatory Visit: Payer: Self-pay | Admitting: Family Medicine

## 2016-01-11 ENCOUNTER — Ambulatory Visit (INDEPENDENT_AMBULATORY_CARE_PROVIDER_SITE_OTHER): Payer: BLUE CROSS/BLUE SHIELD | Admitting: Family Medicine

## 2016-01-11 ENCOUNTER — Encounter: Payer: Self-pay | Admitting: Family Medicine

## 2016-01-11 VITALS — BP 124/67 | HR 67 | Temp 97.4°F | Ht 69.0 in | Wt 258.0 lb

## 2016-01-11 DIAGNOSIS — E119 Type 2 diabetes mellitus without complications: Secondary | ICD-10-CM | POA: Diagnosis not present

## 2016-01-11 DIAGNOSIS — E7800 Pure hypercholesterolemia, unspecified: Secondary | ICD-10-CM | POA: Diagnosis not present

## 2016-01-11 DIAGNOSIS — K219 Gastro-esophageal reflux disease without esophagitis: Secondary | ICD-10-CM | POA: Diagnosis not present

## 2016-01-11 DIAGNOSIS — E559 Vitamin D deficiency, unspecified: Secondary | ICD-10-CM

## 2016-01-11 DIAGNOSIS — Z87898 Personal history of other specified conditions: Secondary | ICD-10-CM | POA: Diagnosis not present

## 2016-01-11 DIAGNOSIS — R635 Abnormal weight gain: Secondary | ICD-10-CM | POA: Diagnosis not present

## 2016-01-11 LAB — BAYER DCA HB A1C WAIVED: HB A1C (BAYER DCA - WAIVED): 6.7 % (ref <7.0)

## 2016-01-11 MED ORDER — ROSUVASTATIN CALCIUM 40 MG PO TABS: 40.0000 mg | ORAL_TABLET | Freq: Every day | ORAL | 3 refills | Status: DC

## 2016-01-11 MED ORDER — PHENTERMINE HCL 37.5 MG PO CAPS: 37.5000 mg | ORAL_CAPSULE | ORAL | 0 refills | Status: DC

## 2016-01-11 MED ORDER — ESOMEPRAZOLE MAGNESIUM 40 MG PO CPDR: DELAYED_RELEASE_CAPSULE | ORAL | 3 refills | Status: DC

## 2016-01-11 NOTE — Progress Notes (Signed)
Subjective:    Patient ID: Paul Johnston, male    DOB: 1950/09/07, 65 y.o.   MRN: [id number]  HPI Pt here for follow up and management of chronic medical problems which includes diabetes, hyperlipidemia, and hypertension. He is taking medications regularly.The patient has had some concern about his increasing weight and is now up to 258 and was 240. A pressure is also been rising. He is due today to get a urine and we will get an A1c because he gets blood work at his workplace. His blood pressure is good. The patient denies any chest pain or shortness of breath other than what he would expect for his weight. He admits to liking potatoes and french fries. He does not drink a lot of of fluids and needs to drink more water. He gets about 6000 steps daily and I told him he needed to get more than this and he understands this. He denies any shortness of breath. He denies any trouble with his stomach including nausea vomiting diarrhea or blood in the stool. He has slight burning when he passes his water. He denies any shortness of breath at bedtime. The patient says that his blood pressures at home run in the 140s over the 80-90 range.    Patient Active Problem List   Diagnosis Date Noted  . Obesity (BMI 30-39.9) 05/17/2015  . Diabetes mellitus without complication ([place])   . Obesity 12/09/2013  . Elevated blood sugar 12/09/2013  . GERD (gastroesophageal reflux disease) 09/01/2012  . Vitamin D deficiency 09/01/2012  . Solitary pulmonary nodule, history of, left side 09/01/2012  . BPH (benign prostatic hyperplasia) 09/01/2012  . Umbilical hernia [id number]  . Hyperlipidemia 12/14/2008  . SMOKER 12/14/2008   Outpatient Encounter Prescriptions as of 01/11/2016  Medication Sig  . aspirin 81 MG tablet Take 81 mg by mouth daily.  . cholecalciferol (VITAMIN D) 1000 UNITS tablet Take 2,000 Units by mouth daily.   Marland Kitchen esomeprazole (NEXIUM) 40 MG capsule TAKE 1 CAPSULE DAILY AT 12 NOON  . fish oil-omega-3  fatty acids 1000 MG capsule Take 2 g by mouth daily.  Marland Kitchen glucose blood test strip Use as instructed  . HYDROcodone-acetaminophen (NORCO) 7.5-325 MG per tablet Take 1 tablet by mouth every 8 (eight) hours as needed for moderate pain.  . Lancets (ONETOUCH ULTRASOFT) lancets Use as instructed  . ranitidine (ZANTAC) 150 MG tablet Take 150 mg by mouth as needed for heartburn.  . rosuvastatin (CRESTOR) 40 MG tablet Take 1 tablet (40 mg total) by mouth daily.  . [DISCONTINUED] amoxicillin-clavulanate (AUGMENTIN) 875-125 MG tablet Take 1 tablet by mouth 2 (two) times daily.  . [DISCONTINUED] benzonatate (TESSALON) 100 MG capsule Take 1 capsule by mouth 3 (three) times daily. Reported on 07/03/2015  . [DISCONTINUED] fluticasone (FLONASE) 50 MCG/ACT nasal spray Place 2 sprays into both nostrils daily. (Patient not taking: Reported on 07/03/2015)  . [DISCONTINUED] ipratropium (ATROVENT) 0.06 % nasal spray Place 2 sprays into both nostrils 2 (two) times daily. As needed for nasal congestion  . [DISCONTINUED] phentermine 37.5 MG capsule Take 1 capsule (37.5 mg total) by mouth every morning.   No facility-administered encounter medications on file as of 01/11/2016.       Review of Systems  Constitutional: Negative.   HENT: Negative.   Eyes: Negative.   Respiratory: Negative.   Cardiovascular: Negative.   Gastrointestinal: Negative.   Endocrine: Negative.   Genitourinary: Negative.   Musculoskeletal: Negative.   Skin: Negative.  Lesion of face - will schedule derm appt  Allergic/Immunologic: Negative.   Neurological: Negative.   Hematological: Negative.   Psychiatric/Behavioral: Negative.        Objective:   Physical Exam  Constitutional: He is oriented to person, place, and time. He appears well-developed and well-nourished. No distress.  Pleasant and alert and overweight  HENT:  Head: Normocephalic and atraumatic.  Right Ear: External ear normal.  Left Ear: External ear normal.    Nose: Nose normal.  Mouth/Throat: Oropharynx is clear and moist. No oropharyngeal exudate.  Eyes: Conjunctivae and EOM are normal. Pupils are equal, round, and reactive to light. Right eye exhibits no discharge. Left eye exhibits no discharge. No scleral icterus.  Neck: Normal range of motion. Neck supple. No thyromegaly present.  No bruits or thyromegaly  Cardiovascular: Normal rate, regular rhythm, normal heart sounds and intact distal pulses.   No murmur heard. Heart has a regular rate and rhythm at 72/m  Pulmonary/Chest: Effort normal and breath sounds normal. No respiratory distress. He has no wheezes. He has no rales.  Clear anteriorly and posteriorly no axillary adenopathy  Abdominal: Soft. Bowel sounds are normal. He exhibits no mass. There is no tenderness. There is no rebound and no guarding.  Abdominal obesity without masses or organ enlargement or bruits  Musculoskeletal: Normal range of motion. He exhibits no edema.  Lymphadenopathy:    He has no cervical adenopathy.  Neurological: He is alert and oriented to person, place, and time. He has normal reflexes. No cranial nerve deficit.  Skin: Skin is warm and dry. No rash noted.  Psychiatric: He has a normal mood and affect. His behavior is normal. Judgment and thought content normal.  Nursing note and vitals reviewed.   BP 124/67 (BP Location: Left Arm)   Pulse 67   Temp 97.4 F (36.3 C) (Oral)   Ht 5\' 9"  (1.753 m)   Wt 258 lb (117 kg)   BMI 38.10 kg/m   Repeat blood pressure 140/72     Assessment & Plan:  1. Type 2 diabetes mellitus without complication, without long-term current use of insulin ([place]) -The patient must continue to make better efforts at losing weight and getting additional exercise. He understands that it is a matter of taking in less calories and burning up more calories. He must be willing to get a handle on this. - Bayer DCA Hb A1c Waived  2. Pure hypercholesterolemia -Continue with current  treatment pending results of lab work  3. Gastroesophageal reflux disease, esophagitis presence not specified -He has no complaints with reflux disease today but must continue with current treatment which is ranitidine.  4. Vitamin D deficiency -Continue with current treatment pending results of lab work  5. Weight gain -Continue with aggressive therapeutic lifestyle changes - Thyroid Panel With TSH  Meds ordered this encounter  Medications  . rosuvastatin (CRESTOR) 40 MG tablet    Sig: Take 1 tablet (40 mg total) by mouth daily.    Dispense:  90 tablet    Refill:  3  . esomeprazole (NEXIUM) 40 MG capsule    Sig: TAKE 1 CAPSULE DAILY AT 12 NOON    Dispense:  90 capsule    Refill:  3  . phentermine 37.5 MG capsule    Sig: Take 1 capsule (37.5 mg total) by mouth every morning.    Dispense:  30 capsule    Refill:  0   Patient Instructions  Continue current medications. Continue good therapeutic lifestyle changes which include  good diet and exercise. Fall precautions discussed with patient. If an FOBT was given today- please return it to our front desk. If you are over 15 years old - you may need Prevnar 97 or the adult Pneumonia vaccine.  **Flu shots are available--- please call and schedule a FLU-CLINIC appointment**  After your visit with [place] today you will receive a survey in the mail or online from [employer] regarding your care with [place]. Please take a moment to fill this out. Your feedback is very important to [place] as you can help [place] better understand your patient needs as well as improve your experience and satisfaction. WE CARE ABOUT YOU!!!   Continue with current medicines Take the phentermine 1 month and come by the office in about 4 weeks and get a blood pressure and weight check at that time Make every effort possible to drink more water and eat less potatoes and bread and get more exercise  Arrie Senate MD   .

## 2016-01-11 NOTE — Patient Instructions (Addendum)
Continue current medications. Continue good therapeutic lifestyle changes which include good diet and exercise. Fall precautions discussed with patient. If an FOBT was given today- please return it to our front desk. If you are over 64 years old - you may need Prevnar 37 or the adult Pneumonia vaccine.  **Flu shots are available--- please call and schedule a FLU-CLINIC appointment**  After your visit with [place] today you will receive a survey in the mail or online from [employer] regarding your care with [place]. Please take a moment to fill this out. Your feedback is very important to [place] as you can help [place] better understand your patient needs as well as improve your experience and satisfaction. WE CARE ABOUT YOU!!!   Continue with current medicines Take the phentermine 1 month and come by the office in about 4 weeks and get a blood pressure and weight check at that time Make every effort possible to drink more water and eat less potatoes and bread and get more exercise

## 2016-01-12 LAB — PSA, TOTAL AND FREE
PROSTATE SPECIFIC AG, SERUM: 0.7 ng/mL (ref 0.0–4.0)
PSA FREE: 0.38 ng/mL
PSA, Free Pct: 54.3 %

## 2016-01-12 LAB — THYROID PANEL WITH TSH
Free Thyroxine Index: 1.8 (ref 1.2–4.9)
T3 Uptake Ratio: 25 % (ref 24–39)
T4, Total: 7 ug/dL (ref 4.5–12.0)
TSH: 2.25 u[IU]/mL (ref 0.450–4.500)

## 2016-01-15 ENCOUNTER — Encounter: Payer: Self-pay | Admitting: Family Medicine

## 2016-01-15 LAB — SPECIMEN STATUS REPORT

## 2016-01-15 LAB — VITAMIN D 25 HYDROXY (VIT D DEFICIENCY, FRACTURES): VIT D 25 HYDROXY: 36.6 ng/mL (ref 30.0–100.0)

## 2016-01-19 ENCOUNTER — Other Ambulatory Visit: Payer: BLUE CROSS/BLUE SHIELD

## 2016-01-19 DIAGNOSIS — Z1212 Encounter for screening for malignant neoplasm of rectum: Secondary | ICD-10-CM

## 2016-01-23 LAB — FECAL OCCULT BLOOD, IMMUNOCHEMICAL: Fecal Occult Bld: NEGATIVE

## 2016-02-09 ENCOUNTER — Ambulatory Visit (INDEPENDENT_AMBULATORY_CARE_PROVIDER_SITE_OTHER): Payer: BLUE CROSS/BLUE SHIELD | Admitting: *Deleted

## 2016-02-09 VITALS — BP 132/81 | HR 75 | Wt 249.5 lb

## 2016-02-09 DIAGNOSIS — Z0130 Encounter for examination of blood pressure without abnormal findings: Secondary | ICD-10-CM

## 2016-02-09 NOTE — Progress Notes (Signed)
Pt here today for weight check and BP check per [facility].

## 2016-02-16 ENCOUNTER — Other Ambulatory Visit: Payer: Self-pay | Admitting: *Deleted

## 2016-02-16 ENCOUNTER — Other Ambulatory Visit: Payer: Self-pay | Admitting: Family Medicine

## 2016-03-15 ENCOUNTER — Ambulatory Visit (INDEPENDENT_AMBULATORY_CARE_PROVIDER_SITE_OTHER): Payer: BLUE CROSS/BLUE SHIELD | Admitting: Family Medicine

## 2016-03-15 ENCOUNTER — Encounter: Payer: Self-pay | Admitting: Family Medicine

## 2016-03-15 VITALS — BP 114/70 | HR 84 | Temp 97.3°F | Ht 69.0 in | Wt 246.0 lb

## 2016-03-15 DIAGNOSIS — J329 Chronic sinusitis, unspecified: Secondary | ICD-10-CM

## 2016-03-15 DIAGNOSIS — J40 Bronchitis, not specified as acute or chronic: Secondary | ICD-10-CM

## 2016-03-15 MED ORDER — CEFUROXIME AXETIL 250 MG PO TABS: 250.0000 mg | ORAL_TABLET | Freq: Two times a day (BID) | ORAL | 0 refills | Status: AC

## 2016-03-15 MED ORDER — BENZONATATE 200 MG PO CAPS: 200.0000 mg | ORAL_CAPSULE | Freq: Three times a day (TID) | ORAL | 0 refills | Status: DC | PRN

## 2016-03-15 NOTE — Progress Notes (Signed)
Subjective:  Patient ID: Paul Johnston, male    DOB: Jun 17, 1950  Age: 66 y.o. MRN: [id number]  CC: Sinusitis (pt here today c/o sinus pressure, headache, cough and sore throat.)   HPI Paul Johnston presents for Patient presents with upper respiratory congestion. Rhinorrhea that is frequently purulent. There is moderate sore throat. Patient reports coughing frequently as well.-colored/purulent sputum noted. There is no fever no chills no sweats. The patient denies being short of breath. Onset was 3-5 days ago. Gradually worsening in spite of home remedies.   History Paul Johnston has a past medical history of Allergy; Arthritis; Diabetes mellitus without complication ([place]); GERD (gastroesophageal reflux disease); Hyperlipidemia; Ruptured intervertebral disc; and Ulcer (Paul Johnston).   He has a past surgical history that includes Appendectomy; Orbital fracture repair; Colonoscopy; Upper gi endoscopy; Umbilical hernia repair (N/A, 07/16/2012); and Insertion of mesh (N/A, 07/16/2012).   His family history includes Cancer in his mother; Emphysema in his father; Heart disease in his paternal grandfather; Heart failure in his father.He reports that he has quit smoking. His smoking use included Cigarettes. He started smoking about 8 years ago. He has never used smokeless tobacco. He reports that he does not drink alcohol or use drugs.  Current Outpatient Prescriptions on File Prior to Visit  Medication Sig Dispense Refill  . aspirin 81 MG tablet Take 81 mg by mouth daily.    . cholecalciferol (VITAMIN D) 1000 UNITS tablet Take 2,000 Units by mouth daily.     Marland Kitchen esomeprazole (NEXIUM) 40 MG capsule TAKE 1 CAPSULE DAILY AT 12 NOON 90 capsule 3  . fish oil-omega-3 fatty acids 1000 MG capsule Take 2 g by mouth daily.    Marland Kitchen glucose blood test strip Use as instructed 100 each 2  . HYDROcodone-acetaminophen (NORCO) 7.5-325 MG per tablet Take 1 tablet by mouth every 8 (eight) hours as needed for moderate pain. 60 tablet 0  .  Lancets (ONETOUCH ULTRASOFT) lancets Use as instructed 100 each 2  . phentermine (ADIPEX-P) 37.5 MG tablet take 1 tablet by mouth every morning 30 tablet 0  . ranitidine (ZANTAC) 150 MG tablet Take 150 mg by mouth as needed for heartburn.    . rosuvastatin (CRESTOR) 40 MG tablet Take 1 tablet (40 mg total) by mouth daily. 90 tablet 3   No current facility-administered medications on file prior to visit.     ROS Review of Systems  Constitutional: Negative for activity change, appetite change, chills and fever.  HENT: Positive for congestion, postnasal drip, rhinorrhea and sinus pressure. Negative for ear discharge, ear pain, hearing loss, nosebleeds, sneezing and trouble swallowing.   Respiratory: Negative for chest tightness and shortness of breath.   Cardiovascular: Negative for chest pain and palpitations.  Skin: Negative for rash.    Objective:  BP 114/70   Pulse 84   Temp 97.3 F (36.3 C) (Oral)   Ht 5\' 9"  (1.753 m)   Wt 246 lb (111.6 kg)   BMI 36.33 kg/m   Physical Exam  Constitutional: He appears well-developed and well-nourished.  HENT:  Head: Normocephalic and atraumatic.  Right Ear: Tympanic membrane and external ear normal. No decreased hearing is noted.  Left Ear: Tympanic membrane and external ear normal. No decreased hearing is noted.  Nose: Mucosal edema present. Right sinus exhibits no frontal sinus tenderness. Left sinus exhibits no frontal sinus tenderness.  Mouth/Throat: No oropharyngeal exudate or posterior oropharyngeal erythema.  Neck: No Brudzinski's sign noted.  Pulmonary/Chest: Breath sounds normal. No respiratory distress.  Lymphadenopathy:  Head (right side): No preauricular adenopathy present.       Head (left side): No preauricular adenopathy present.       Right cervical: No superficial cervical adenopathy present.      Left cervical: No superficial cervical adenopathy present.    Assessment & Plan:   Paul Johnston was seen today for  sinusitis.  Diagnoses and all orders for this visit:  Sinobronchitis  Other orders -     benzonatate (TESSALON) 200 MG capsule; Take 1 capsule (200 mg total) by mouth 3 (three) times daily as needed for cough. -     cefUROXime (CEFTIN) 250 MG tablet; Take 1 tablet (250 mg total) by mouth 2 (two) times daily with a meal.   I am having Paul Johnston start on benzonatate and cefUROXime. I am also having him maintain his aspirin, fish oil-omega-3 fatty acids, cholecalciferol, ranitidine, HYDROcodone-acetaminophen, glucose blood, onetouch ultrasoft, rosuvastatin, esomeprazole, and phentermine.  Meds ordered this encounter  Medications  . benzonatate (TESSALON) 200 MG capsule    Sig: Take 1 capsule (200 mg total) by mouth 3 (three) times daily as needed for cough.    Dispense:  20 capsule    Refill:  0  . cefUROXime (CEFTIN) 250 MG tablet    Sig: Take 1 tablet (250 mg total) by mouth 2 (two) times daily with a meal.    Dispense:  20 tablet    Refill:  0     Follow-up: Return if symptoms worsen or fail to improve.  Claretta Fraise, M.D.

## 2016-03-28 ENCOUNTER — Other Ambulatory Visit: Payer: Self-pay | Admitting: Family Medicine

## 2016-04-03 NOTE — Telephone Encounter (Signed)
rx called into the pharmacy  

## 2016-06-10 ENCOUNTER — Encounter: Payer: Self-pay | Admitting: Family Medicine

## 2016-06-13 ENCOUNTER — Encounter: Payer: Self-pay | Admitting: Family Medicine

## 2016-06-13 ENCOUNTER — Ambulatory Visit (INDEPENDENT_AMBULATORY_CARE_PROVIDER_SITE_OTHER): Payer: BLUE CROSS/BLUE SHIELD | Admitting: Family Medicine

## 2016-06-13 VITALS — BP 122/74 | HR 85 | Temp 97.0°F | Ht 69.0 in | Wt 248.0 lb

## 2016-06-13 DIAGNOSIS — R5383 Other fatigue: Secondary | ICD-10-CM

## 2016-06-13 DIAGNOSIS — Z0000 Encounter for general adult medical examination without abnormal findings: Secondary | ICD-10-CM

## 2016-06-13 DIAGNOSIS — E7800 Pure hypercholesterolemia, unspecified: Secondary | ICD-10-CM

## 2016-06-13 DIAGNOSIS — N400 Enlarged prostate without lower urinary tract symptoms: Secondary | ICD-10-CM

## 2016-06-13 DIAGNOSIS — E669 Obesity, unspecified: Secondary | ICD-10-CM

## 2016-06-13 DIAGNOSIS — K219 Gastro-esophageal reflux disease without esophagitis: Secondary | ICD-10-CM

## 2016-06-13 DIAGNOSIS — Z87898 Personal history of other specified conditions: Secondary | ICD-10-CM

## 2016-06-13 DIAGNOSIS — R0683 Snoring: Secondary | ICD-10-CM

## 2016-06-13 DIAGNOSIS — E559 Vitamin D deficiency, unspecified: Secondary | ICD-10-CM

## 2016-06-13 DIAGNOSIS — E119 Type 2 diabetes mellitus without complications: Secondary | ICD-10-CM

## 2016-06-13 LAB — URINALYSIS, COMPLETE
Bilirubin, UA: NEGATIVE
GLUCOSE, UA: NEGATIVE
Ketones, UA: NEGATIVE
Leukocytes, UA: NEGATIVE
NITRITE UA: NEGATIVE
PH UA: 6.5 (ref 5.0–7.5)
Protein, UA: NEGATIVE
RBC, UA: NEGATIVE
Specific Gravity, UA: 1.015 (ref 1.005–1.030)
UUROB: 0.2 mg/dL (ref 0.2–1.0)

## 2016-06-13 LAB — MICROSCOPIC EXAMINATION
BACTERIA UA: NONE SEEN
Epithelial Cells (non renal): NONE SEEN /hpf (ref 0–10)
RBC MICROSCOPIC, UA: NONE SEEN /HPF (ref 0–?)
Renal Epithel, UA: NONE SEEN /hpf
WBC UA: NONE SEEN /HPF (ref 0–?)

## 2016-06-13 MED ORDER — METFORMIN HCL 500 MG PO TABS: 500.0000 mg | ORAL_TABLET | Freq: Two times a day (BID) | ORAL | 3 refills | Status: DC

## 2016-06-13 NOTE — Progress Notes (Signed)
Subjective:    Patient ID: Paul Johnston, male    DOB: May 27, 1950, 66 y.o.   MRN: [id number]  HPI Pt here for annual CPE and follow up and management of chronic medical problems which includes diabetes, hyperlipidemia and gerd. He is taking medication regularly.The patient has had lab work done from his workplace. His creatinine was good at 94. Electrolytes including potassium were good. All of the liver function tests were normal. A traditional lipid panel had a total cholesterol that was excellent at 118 and LDL C cholesterol that was excellent at 58. The good cholesterol was low at 38. CBC had a good hemoglobin at 14.4 with a normal white blood cell count. The platelet count was adequate. The hemoglobin A1c was 6.8% and his PSA was 0.8. His blood sugar was 123 with the uric acid that was 4.3. Since this was done at his workplace I'm certain that he has a copy of this and if he doesn't we'll make sure is a copy for his records. The patient today comes in for his physical exam and only complains of fatigue. On his blood work today I did not see a thyroid profile. The patient did have a recent thyroid profile and office back in November and this was within normal limits. He is overweight. He does snore. We discussed getting a sleep study on him and we will arrange this for him. He has had a colonoscopy with Dr. hung in the past couple of years and we'll try to get a copy of that for his records. Because he is not taking any medication for his blood sugar we will try again some metformin 500 mg we'll start with one daily and may have to increase this to one twice a day. We will instruct him how to take it so that he will have less problems with his stomach.     Patient Active Problem List   Diagnosis Date Noted  . Obesity (BMI 30-39.9) 05/17/2015  . Diabetes mellitus without complication ([place])   . Obesity 12/09/2013  . Elevated blood sugar 12/09/2013  . GERD (gastroesophageal reflux disease)  09/01/2012  . Vitamin D deficiency 09/01/2012  . Solitary pulmonary nodule, history of, left side 09/01/2012  . BPH (benign prostatic hyperplasia) 09/01/2012  . Umbilical hernia 67/89/3810  . Hyperlipidemia 12/14/2008  . SMOKER 12/14/2008   Outpatient Encounter Prescriptions as of 06/13/2016  Medication Sig  . aspirin 81 MG tablet Take 81 mg by mouth daily.  . Cholecalciferol (VITAMIN D3) 5000 units CAPS Take by mouth. 1 a day  . esomeprazole (NEXIUM) 40 MG capsule TAKE 1 CAPSULE DAILY AT 12 NOON  . fish oil-omega-3 fatty acids 1000 MG capsule Take 2 g by mouth daily.  Marland Kitchen glucose blood test strip Use as instructed  . Lancets (ONETOUCH ULTRASOFT) lancets Use as instructed  . phentermine (ADIPEX-P) 37.5 MG tablet take 1 tablet by mouth every morning  . ranitidine (ZANTAC) 150 MG tablet Take 150 mg by mouth as needed for heartburn.  . rosuvastatin (CRESTOR) 40 MG tablet Take 1 tablet (40 mg total) by mouth daily.  . [DISCONTINUED] cholecalciferol (VITAMIN D) 1000 UNITS tablet Take 2,000 Units by mouth daily.   . [DISCONTINUED] benzonatate (TESSALON) 200 MG capsule Take 1 capsule (200 mg total) by mouth 3 (three) times daily as needed for cough.  . [DISCONTINUED] HYDROcodone-acetaminophen (NORCO) 7.5-325 MG per tablet Take 1 tablet by mouth every 8 (eight) hours as needed for moderate pain.   No facility-administered encounter medications  on file as of 06/13/2016.      Review of Systems  Constitutional: Positive for fatigue.  HENT: Negative.   Eyes: Negative.   Respiratory: Negative.   Cardiovascular: Negative.   Gastrointestinal: Negative.   Endocrine: Negative.   Genitourinary: Negative.   Musculoskeletal: Negative.   Skin: Negative.   Allergic/Immunologic: Negative.   Neurological: Negative.   Hematological: Negative.   Psychiatric/Behavioral: Negative.        Objective:   Physical Exam  Constitutional: He is oriented to person, place, and time. He appears well-developed and  well-nourished. No distress.  The patient is pleasant and alert  HENT:  Head: Normocephalic and atraumatic.  Right Ear: External ear normal.  Left Ear: External ear normal.  Mouth/Throat: Oropharynx is clear and moist. No oropharyngeal exudate.  Slight nasal congestion  Eyes: Conjunctivae and EOM are normal. Pupils are equal, round, and reactive to light. Right eye exhibits no discharge. Left eye exhibits no discharge. No scleral icterus.  Neck: Normal range of motion. Neck supple. No thyromegaly present.  No bruits thyromegaly or anterior cervical adenopathy  Cardiovascular: Normal rate, regular rhythm, normal heart sounds and intact distal pulses.   No murmur heard. Heart has a regular rate and rhythm at 84/m  Pulmonary/Chest: Effort normal and breath sounds normal. No respiratory distress. He has no wheezes. He has no rales. He exhibits no tenderness.  Clear anteriorly and posteriorly and no axillary adenopathy  Abdominal: Soft. Bowel sounds are normal. He exhibits no mass. There is no tenderness. There is no rebound and no guarding.  Abdominal obesity without masses tenderness or organ enlargement or bruits.  Genitourinary: Rectum normal and penis normal.  Genitourinary Comments: The prostate is slightly enlarged without any lumps or masses. There are no rectal masses. External genitalia were within normal limits without inguinal adenopathy or inguinal hernias.  Musculoskeletal: Normal range of motion. He exhibits no edema.  Lymphadenopathy:    He has no cervical adenopathy.  Neurological: He is alert and oriented to person, place, and time. He has normal reflexes. No cranial nerve deficit.  Skin: Skin is warm and dry. No rash noted.  Psychiatric: He has a normal mood and affect. His behavior is normal. Judgment and thought content normal.  Nursing note and vitals reviewed.  BP 122/74 (BP Location: Left Arm)   Pulse 85   Temp 97 F (36.1 C) (Oral)   Ht 5\' 9"  (1.753 m)   Wt 248 lb  (112.5 kg)   BMI 36.62 kg/m         Assessment & Plan:  1. Type 2 diabetes mellitus without complication, without long-term current use of insulin ([facility]) -Start metformin 500 mg daily, monitor blood sugars closely and repeat hemoglobin A1c in 3-4 months - Microalbumin / creatinine urine ratio - Ambulatory referral to Pulmonology  2. Pure hypercholesterolemia -Continue with current cholesterol treatment and omega-3 fatty acids  3. Gastroesophageal reflux disease, esophagitis presence not specified -The patient is currently not having any problems with reflux and he will continue with his ranitidine.  4. Vitamin D deficiency -Continue with vitamin D replacement  5. History of elevated PSA -The PSA is now within normal limits and not elevated.  6. Benign prostatic hyperplasia, unspecified whether lower urinary tract symptoms present -The prostate is enlarged but soft and smooth. There are no rectal masses. - Urinalysis, Complete  7. Annual physical exam -The patient needs to work aggressively on weight loss through diet and exercise -Because of his ongoing fatigue we will arrange  for him to have a sleep study - Urinalysis, Complete  8. Obesity, unspecified classification, unspecified obesity type, unspecified whether serious comorbidity present -Continue weight loss through diet and exercise - Ambulatory referral to Pulmonology  9. Other fatigue -The patient had a thyroid profile back in November 2017 that was normal. All of the lab studies recently done were within normal limits with no findings to implement fatigue. - Ambulatory referral to Pulmonology  10. Snores -Patient needs sleep study - Ambulatory referral to Pulmonology  Meds ordered this encounter  Medications  . Cholecalciferol (VITAMIN D3) 5000 units CAPS    Sig: Take by mouth. 1 a day  . metFORMIN (GLUCOPHAGE) 500 MG tablet    Sig: Take 1 tablet (500 mg total) by mouth 2 (two) times daily with a meal.     Dispense:  180 tablet    Refill:  3   Patient Instructions  Continue current medications. Continue good therapeutic lifestyle changes which include good diet and exercise. Fall precautions discussed with patient. If an FOBT was given today- please return it to our front desk. If you are over 57 years old - you may need Prevnar 10 or the adult Pneumonia vaccine.  **Flu shots are available--- please call and schedule a FLU-CLINIC appointment**  After your visit with [place] today you will receive a survey in the mail or online from [employer] regarding your care with [place]. Please take a moment to fill this out. Your feedback is very important to [place] as you can help [place] better understand your patient needs as well as improve your experience and satisfaction. WE CARE ABOUT YOU!!!   Take the metformin regularly 500 mg 1 daily with food Come back in 3 months for a hemoglobin A1c Continue to work on weight loss through diet and exercise We'll arrange for you to have a sleep study to further evaluate you for sleep apnea Check blood sugars periodically and blood pressures when possible  Arrie Senate MD

## 2016-06-13 NOTE — Patient Instructions (Addendum)
Continue current medications. Continue good therapeutic lifestyle changes which include good diet and exercise. Fall precautions discussed with patient. If an FOBT was given today- please return it to our front desk. If you are over 65 years old - you may need Prevnar 43 or the adult Pneumonia vaccine.  **Flu shots are available--- please call and schedule a FLU-CLINIC appointment**  After your visit with [place] today you will receive a survey in the mail or online from [employer] regarding your care with [place]. Please take a moment to fill this out. Your feedback is very important to [place] as you can help [place] better understand your patient needs as well as improve your experience and satisfaction. WE CARE ABOUT YOU!!!   Take the metformin regularly 500 mg 1 daily with food Come back in 3 months for a hemoglobin A1c Continue to work on weight loss through diet and exercise We'll arrange for you to have a sleep study to further evaluate you for sleep apnea Check blood sugars periodically and blood pressures when possible

## 2016-06-14 LAB — MICROALBUMIN / CREATININE URINE RATIO
Creatinine, Urine: 149.8 mg/dL
Microalb/Creat Ratio: 16 mg/g creat (ref 0.0–30.0)
Microalbumin, Urine: 24 ug/mL

## 2016-06-17 ENCOUNTER — Other Ambulatory Visit: Payer: Self-pay | Admitting: Family Medicine

## 2016-06-20 ENCOUNTER — Other Ambulatory Visit: Payer: Self-pay | Admitting: Family Medicine

## 2016-06-20 NOTE — Telephone Encounter (Signed)
Phoned to pharm

## 2016-06-20 NOTE — Telephone Encounter (Signed)
Needs to be approved by dr. Laurance Flatten

## 2016-06-20 NOTE — Telephone Encounter (Signed)
This prescription is okay for this patient. Please send written prescription by mail to him for this medication and he should continue with aggressive therapeutic lifestyle changes.

## 2016-09-05 ENCOUNTER — Other Ambulatory Visit: Payer: BLUE CROSS/BLUE SHIELD

## 2016-09-05 DIAGNOSIS — E119 Type 2 diabetes mellitus without complications: Secondary | ICD-10-CM

## 2016-09-06 ENCOUNTER — Ambulatory Visit (INDEPENDENT_AMBULATORY_CARE_PROVIDER_SITE_OTHER): Payer: BLUE CROSS/BLUE SHIELD | Admitting: Internal Medicine

## 2016-09-06 ENCOUNTER — Encounter: Payer: Self-pay | Admitting: Internal Medicine

## 2016-09-06 VITALS — BP 122/80 | HR 81 | Ht 69.0 in | Wt 242.0 lb

## 2016-09-06 DIAGNOSIS — G4733 Obstructive sleep apnea (adult) (pediatric): Secondary | ICD-10-CM | POA: Diagnosis not present

## 2016-09-06 DIAGNOSIS — F17200 Nicotine dependence, unspecified, uncomplicated: Secondary | ICD-10-CM

## 2016-09-06 LAB — BAYER DCA HB A1C WAIVED: HB A1C: 6.7 % (ref <7.0)

## 2016-09-06 NOTE — Progress Notes (Signed)
09/06/16- 66 year old male former smoker for sleep evaluation. Referred courtesy of Dr Redge Gainer; fatigued and snores loudly. Medical problems include DM 2, GERD, obesity,  Weight today 242 pounds Epworth score 1/24 He describes bedtime is 11 PM with 5-10 minutes sleep latency, waking 3 times with quick return to sleep, before up at 4:30 AM so he can get to work by 6. 1-1/2 cups of coffee. No sleep medicines. ENT surgery-left orbital fracture. Rheumatologist in the past told him he had "no problem" from his previous smoking. Admits occasional chest congestion, cough with clear sputum. Denies heart problems. Epworth sleepiness score 1/24  Prior to Admission medications   Medication Sig Start Date End Date Taking? Authorizing Provider  aspirin 81 MG tablet Take 81 mg by mouth daily.   Yes [provider]  Cholecalciferol (VITAMIN D3) 5000 units CAPS Take by mouth. 1 a day   Yes [provider]  esomeprazole (NEXIUM) 40 MG capsule TAKE 1 CAPSULE DAILY AT 12 NOON 01/11/16  Yes Chipper Herb, MD  fish oil-omega-3 fatty acids 1000 MG capsule Take 2 g by mouth daily.   Yes [provider]  glucose blood test strip Use as instructed 05/22/15  Yes Chipper Herb, MD  Lancets [facility] ULTRASOFT) lancets Use as instructed 05/23/15  Yes Chipper Herb, MD  metFORMIN (GLUCOPHAGE) 500 MG tablet Take 1 tablet (500 mg total) by mouth 2 (two) times daily with a meal. 06/13/16  Yes Chipper Herb, MD  ranitidine (ZANTAC) 150 MG tablet Take 150 mg by mouth as needed for heartburn.   Yes [provider]  rosuvastatin (CRESTOR) 40 MG tablet Take 1 tablet (40 mg total) by mouth daily. 01/11/16  Yes Chipper Herb, MD  phentermine (ADIPEX-P) 37.5 MG tablet take 1 tablet by mouth once daily 09/10/16   Chipper Herb, MD   Past Medical History:  Diagnosis Date  . Allergy   . Arthritis   . Diabetes mellitus without complication ([place])   . GERD (gastroesophageal reflux  disease)   . Hyperlipidemia   . Ruptured intervertebral disc   . Ulcer    Past Surgical History:  Procedure Laterality Date  . APPENDECTOMY    . COLONOSCOPY    . INSERTION OF MESH N/A 07/16/2012   Procedure: INSERTION OF MESH;  Surgeon: Harl Bowie, MD;  Location: [place];  Service: General;  Laterality: N/A;  . ORBITAL FRACTURE SURGERY    . UMBILICAL HERNIA REPAIR N/A 07/16/2012   Procedure: HERNIA REPAIR UMBILICAL WITH MESH;  Surgeon: Harl Bowie, MD;  Location: [facility];  Service: General;  Laterality: N/A;  . UPPER GI ENDOSCOPY     Family History  Problem Relation Age of Onset  . Emphysema Father        died age 61  . Heart failure Father   . Cancer Mother   . Heart disease Paternal Grandfather    Social History   Social History  . Marital status: Married    Spouse name: N/A  . Number of children: 2  . Years of education: N/A   Occupational History  .  [employer] trainer   Social History Main Topics  . Smoking status: Former Smoker    Packs/day: 1.00    Years: 33.00    Types: Cigarettes    Quit date: 09/06/2008  . Smokeless tobacco: Never Used  . Alcohol use No  . Drug use: No  . Sexual activity: Not on file  Other Topics Concern  . Not on file   Social History Narrative   Two natural children and three step.  Lives with wife.    ROS-see HPI   + = pos Constitutional:    weight loss, night sweats, fevers, chills, fatigue, lassitude. HEENT:    headaches, difficulty swallowing, tooth/dental problems, sore throat,       sneezing, itching, ear ache, nasal congestion, post nasal drip, snoring CV:    chest pain, orthopnea, PND, swelling in lower extremities, anasarca,                                                dizziness, palpitations Resp:   shortness of breath with exertion or at rest.                productive cough,   non-productive cough, coughing up of blood.              change in color of mucus.   wheezing.   Skin:    rash or lesions. GI:  No-   heartburn, indigestion, abdominal pain, nausea, vomiting, diarrhea,                 change in bowel habits, loss of appetite GU: dysuria, change in color of urine, no urgency or frequency.   flank pain. MS:   joint pain, stiffness, decreased range of motion, back pain. Neuro-     nothing unusual Psych:  change in mood or affect.  depression or anxiety.   memory loss.  OBJ- Physical Exam General- Alert, Oriented, Affect-appropriate, Distress- none acute, Skin- rash-none, lesions- none, excoriation- none Lymphadenopathy- none Head- atraumatic            Eyes- Gross vision intact, PERRLA, conjunctivae and secretions clear            Ears- Hearing, canals-normal            Nose- Clear, no-Septal dev, mucus, polyps, erosion, perforation             Throat- Mallampati III-IV , mucosa clear , drainage- none, tonsils- atrophic, own teeth Neck- flexible , trachea midline, no stridor , thyroid nl, carotid no bruit Chest - symmetrical excursion , unlabored           Heart/CV- RRR , no murmur , no gallop  , no rub, nl s1 s2                           - JVD- none , edema- none, stasis changes- none, varices- none           Lung- clear to P&A, wheeze- none, cough- none , dullness-none, rub- none           Chest wall-  Abd-  Br/ Gen/ Rectal- Not done, not indicated Extrem- cyanosis- none, clubbing, none, atrophy- none, strength- nl Neuro- grossly intact to observation

## 2016-09-06 NOTE — Patient Instructions (Signed)
Order- schedule unattended home sleep test      Dx OSA  Please call as needed

## 2016-09-08 ENCOUNTER — Other Ambulatory Visit: Payer: Self-pay | Admitting: Family Medicine

## 2016-09-10 NOTE — Telephone Encounter (Signed)
Phoned in and pt aware 

## 2016-09-10 NOTE — Telephone Encounter (Signed)
Last filled 08/07/16, last seen 06/13/16. Call in

## 2016-09-15 DIAGNOSIS — G4733 Obstructive sleep apnea (adult) (pediatric): Secondary | ICD-10-CM

## 2016-09-15 DIAGNOSIS — Z8669 Personal history of other diseases of the nervous system and sense organs: Secondary | ICD-10-CM | POA: Insufficient documentation

## 2016-09-15 NOTE — Assessment & Plan Note (Signed)
Tentative dx- high probability based on hx and physical exam Education and discussion of OSA done Plan- sleep study

## 2016-09-15 NOTE — Assessment & Plan Note (Signed)
He indicates he has successfully remained off of cigarettes over at least the past year. I strongly encouraged him to stick with this.

## 2016-10-05 DIAGNOSIS — G4733 Obstructive sleep apnea (adult) (pediatric): Secondary | ICD-10-CM | POA: Diagnosis not present

## 2016-10-09 ENCOUNTER — Other Ambulatory Visit: Payer: Self-pay | Admitting: *Deleted

## 2016-10-09 DIAGNOSIS — G4733 Obstructive sleep apnea (adult) (pediatric): Secondary | ICD-10-CM

## 2016-10-21 ENCOUNTER — Encounter: Payer: Self-pay | Admitting: Family Medicine

## 2016-11-12 ENCOUNTER — Other Ambulatory Visit: Payer: Self-pay | Admitting: Family Medicine

## 2016-11-13 NOTE — Telephone Encounter (Signed)
Last seen 06/13/16  DWM  If approved route to nurse to call into [facility]  [phone]

## 2016-11-13 NOTE — Telephone Encounter (Signed)
Phoned on

## 2016-11-27 ENCOUNTER — Other Ambulatory Visit: Payer: Self-pay | Admitting: Family Medicine

## 2016-12-04 ENCOUNTER — Ambulatory Visit (INDEPENDENT_AMBULATORY_CARE_PROVIDER_SITE_OTHER): Payer: BLUE CROSS/BLUE SHIELD | Admitting: Internal Medicine

## 2016-12-04 ENCOUNTER — Encounter: Payer: Self-pay | Admitting: Internal Medicine

## 2016-12-04 ENCOUNTER — Encounter: Payer: Self-pay | Admitting: Family Medicine

## 2016-12-04 VITALS — BP 118/78 | HR 84 | Ht 69.0 in | Wt 242.6 lb

## 2016-12-04 DIAGNOSIS — G4733 Obstructive sleep apnea (adult) (pediatric): Secondary | ICD-10-CM | POA: Diagnosis not present

## 2016-12-04 DIAGNOSIS — E669 Obesity, unspecified: Secondary | ICD-10-CM

## 2016-12-04 NOTE — Progress Notes (Signed)
09/06/16- 66 year old male former smoker for sleep evaluation. Referred courtesy of Dr Redge Gainer; fatigued and snores loudly. Medical problems include DM 2, GERD, obesity,  Weight today 242 pounds Epworth score 1/24 He describes bedtime is 11 PM with 5-10 minutes sleep latency, waking 3 times with quick return to sleep, before up at 4:30 AM so he can get to work by 6. 1-1/2 cups of coffee. No sleep medicines. ENT surgery-left orbital fracture. Rheumatologist in the past told him he had "no problem" from his previous smoking. Admits occasional chest congestion, cough with clear sputum. Denies heart problems. Epworth sleepiness score 1/24  12/04/16- 89 yoM former smoker followed for OSA, complicated by DM 2, GERD, obesity Unattended Home Sleep Test 10/05/16-AHI 14.7/hour, desaturation to 67%, body weight 242 pounds I had suggested starting CPAP but he indicated he would rather wait until this visit to discuss. OSA; Pt had HST and was told about needing CPAP machine; pt was not ready to be set up at that time; would like to discuss set up,etc.  We had an extended discussion of the medical significance of sleep apnea, treatment options, and how he would interact with a DME company providing CPAP. We also discussed alternative therapies, especially a fitted oral appliance.  ROS-see HPI   + = pos Constitutional:    weight loss, night sweats, fevers, chills, fatigue, lassitude. HEENT:    headaches, difficulty swallowing, tooth/dental problems, sore throat,       sneezing, itching, ear ache, nasal congestion, post nasal drip, snoring CV:    chest pain, orthopnea, PND, swelling in lower extremities, anasarca,                                                dizziness, palpitations Resp:   shortness of breath with exertion or at rest.                productive cough,   non-productive cough, coughing up of blood.              change in color of mucus.  wheezing.   Skin:    rash or lesions. GI:  No-    heartburn, indigestion, abdominal pain, nausea, vomiting, diarrhea,                 change in bowel habits, loss of appetite GU: dysuria, change in color of urine, no urgency or frequency.   flank pain. MS:   joint pain, stiffness, decreased range of motion, back pain. Neuro-     nothing unusual Psych:  change in mood or affect.  depression or anxiety.   memory loss.  OBJ- Physical Exam General- Alert, Oriented, Affect-appropriate, Distress- none acute, + obese Skin- rash-none, lesions- none, excoriation- none Lymphadenopathy- none Head- atraumatic            Eyes- Gross vision intact, PERRLA, conjunctivae and secretions clear            Ears- Hearing, canals-normal            Nose- Clear, no-Septal dev, mucus, polyps, erosion, perforation             Throat- Mallampati III-IV , mucosa clear , drainage- none, tonsils- atrophic, own teeth Neck- flexible , trachea midline, no stridor , thyroid nl, carotid no bruit Chest - symmetrical excursion , unlabored  Heart/CV- RRR , no murmur , no gallop  , no rub, nl s1 s2                           - JVD- none , edema- none, stasis changes- none, varices- none           Lung- clear to P&A, wheeze- none, cough- none , dullness-none, rub- none           Chest wall-  Abd-  Br/ Gen/ Rectal- Not done, not indicated Extrem- cyanosis- none, clubbing, none, atrophy- none, strength- nl Neuro- grossly intact to observation

## 2016-12-04 NOTE — Patient Instructions (Signed)
Order- new DME, new CPAP auto 5-20, mask of choice, humidifier, supplies, AirView     Dx OSA  Please call as needed   

## 2016-12-12 ENCOUNTER — Ambulatory Visit (INDEPENDENT_AMBULATORY_CARE_PROVIDER_SITE_OTHER): Payer: BLUE CROSS/BLUE SHIELD

## 2016-12-12 ENCOUNTER — Encounter: Payer: Self-pay | Admitting: Family Medicine

## 2016-12-12 ENCOUNTER — Ambulatory Visit (INDEPENDENT_AMBULATORY_CARE_PROVIDER_SITE_OTHER): Payer: BLUE CROSS/BLUE SHIELD | Admitting: Family Medicine

## 2016-12-12 VITALS — BP 127/72 | HR 80 | Temp 97.3°F | Ht 69.0 in | Wt 243.0 lb

## 2016-12-12 DIAGNOSIS — K219 Gastro-esophageal reflux disease without esophagitis: Secondary | ICD-10-CM | POA: Diagnosis not present

## 2016-12-12 DIAGNOSIS — Z23 Encounter for immunization: Secondary | ICD-10-CM

## 2016-12-12 DIAGNOSIS — E559 Vitamin D deficiency, unspecified: Secondary | ICD-10-CM

## 2016-12-12 DIAGNOSIS — Z87898 Personal history of other specified conditions: Secondary | ICD-10-CM

## 2016-12-12 DIAGNOSIS — N400 Enlarged prostate without lower urinary tract symptoms: Secondary | ICD-10-CM | POA: Diagnosis not present

## 2016-12-12 DIAGNOSIS — E119 Type 2 diabetes mellitus without complications: Secondary | ICD-10-CM

## 2016-12-12 DIAGNOSIS — E7800 Pure hypercholesterolemia, unspecified: Secondary | ICD-10-CM

## 2016-12-12 DIAGNOSIS — G4733 Obstructive sleep apnea (adult) (pediatric): Secondary | ICD-10-CM

## 2016-12-12 LAB — BAYER DCA HB A1C WAIVED: HB A1C: 6.4 % (ref <7.0)

## 2016-12-12 MED ORDER — ROSUVASTATIN CALCIUM 40 MG PO TABS: 40.0000 mg | ORAL_TABLET | Freq: Every day | ORAL | 3 refills | Status: DC

## 2016-12-12 NOTE — Patient Instructions (Addendum)
Medicare Annual Wellness Visit  [facility] and the medical providers at [facility] strive to bring you the best medical care.  In doing so we not only want to address your current medical conditions and concerns but also to detect new conditions early and prevent illness, disease and health-related problems.    Medicare offers a yearly Wellness Visit which allows our clinical staff to assess your need for preventative services including immunizations, lifestyle education, counseling to decrease risk of preventable diseases and screening for fall risk and other medical concerns.    This visit is provided free of charge (no copay) for all Medicare recipients. The clinical pharmacists at [facility] have begun to conduct these Wellness Visits which will also include a thorough review of all your medications.    As you primary medical provider recommend that you make an appointment for your Annual Wellness Visit if you have not done so already this year.  You may set up this appointment before you leave today or you may call back ([phone]) and schedule an appointment.  Please make sure when you call that you mention that you are scheduling your Annual Wellness Visit with the clinical pharmacist so that the appointment may be made for the proper length of time.     Continue current medications. Continue good therapeutic lifestyle changes which include good diet and exercise. Fall precautions discussed with patient. If an FOBT was given today- please return it to our front desk. If you are over 30 years old - you may need Prevnar 43 or the adult Pneumonia vaccine.  **Flu shots are available--- please call and schedule a FLU-CLINIC appointment**  After your visit with [place] today you will receive a survey in the mail or online from [employer] regarding your care with [place]. Please take a moment to fill this out. Your feedback is very  important to [place] as you can help [place] better understand your patient needs as well as improve your experience and satisfaction. WE CARE ABOUT YOU!!!   Continue with aggressive therapeutic lifestyle changes Check with your insurance about the new shingles shot and see if they will cover this if you get one. Let us know we can give you this at the next visit Continue to drink plenty of fluids stay active lose weight Check with your insurance regarding the new shingles shot that is available to see if you have coverage for this. Call us back if you still have issues with your insurance paying for the machine for sleep apnea and speak with either Suanne Marker or Kenney Houseman, speak with Dr. Janee Morn office first as they may can help you as I am sure they have had similar situations like yours in the past

## 2016-12-12 NOTE — Progress Notes (Signed)
Subjective:    Patient ID: Paul Johnston, male    DOB: Jan 01, 1951, 66 y.o.   MRN: [id number]  HPI Pt here for follow up and management of chronic medical problems which includes diabetes, hyperlipidemia and gerd. He is taking medication regularly.  The patient is here today to get his flu shot and a repeat hemoglobin A1c.  He also is requesting a refill on his Crestor.  He will get a chest x-ray today.  The previous lab work that he gets on a yearly basis was reviewed again today from April of this year.  Last hemoglobin A1c was 6.7% in July and the previous one was also 6.7%.  The patient has no specific complaints today.  The patient denies any chest pain or shortness of breath.  He denies any trouble with nausea vomiting diarrhea blood in the stool or black tarry bowel movements and is passing his water without problems.  His next colonoscopy is not due for 10 years from the last one which would be February 2027.  His blood sugars at home of been running no more than 130 and he usually checks them in the evening time.  The fasting blood sugars on the weekend seem to run a little bit higher than this.      Patient Active Problem List   Diagnosis Date Noted  . Obstructive sleep apnea 09/15/2016  . Obesity (BMI 30-39.9) 05/17/2015  . Diabetes mellitus without complication ([place])   . Obesity 12/09/2013  . Elevated blood sugar 12/09/2013  . GERD (gastroesophageal reflux disease) 09/01/2012  . Vitamin D deficiency 09/01/2012  . Solitary pulmonary nodule, history of, left side 09/01/2012  . BPH (benign prostatic hyperplasia) 09/01/2012  . Umbilical hernia 25/06/3974  . Hyperlipidemia 12/14/2008  . Tobacco use disorder in remission 12/14/2008   Outpatient Encounter Prescriptions as of 12/12/2016  Medication Sig  . aspirin 81 MG tablet Take 81 mg by mouth daily.  . Cholecalciferol (VITAMIN D3) 5000 units CAPS Take by mouth. 1 a day  . esomeprazole (NEXIUM) 40 MG capsule TAKE 1 CAPSULE DAILY AT  12 NOON  . fish oil-omega-3 fatty acids 1000 MG capsule Take 2 g by mouth daily.  Mar[facility] Kitchen glucose blood test strip Use as instructed  . Lancets (ONETOUCH ULTRASOFT) lancets Use as instructed  . metFORMIN (GLUCOPHAGE) 500 MG tablet Take 1 tablet (500 mg total) by mouth 2 (two) times daily with a meal.  . phentermine (ADIPEX-P) 37.5 MG tablet take 1 tablet once daily  . ranitidine (ZANTAC) 150 MG tablet Take 150 mg by mouth as needed for heartburn.  . rosuvastatin (CRESTOR) 40 MG tablet Take 1 tablet (40 mg total) by mouth daily.  . [DISCONTINUED] rosuvastatin (CRESTOR) 40 MG tablet Take 1 tablet (40 mg total) by mouth daily.   No facility-administered encounter medications on file as of 12/12/2016.      Review of Systems  Constitutional: Negative.   HENT: Negative.   Eyes: Negative.   Respiratory: Negative.   Cardiovascular: Negative.   Gastrointestinal: Negative.   Endocrine: Negative.   Genitourinary: Negative.   Musculoskeletal: Negative.   Skin: Negative.   Allergic/Immunologic: Negative.   Neurological: Negative.   Hematological: Negative.   Psychiatric/Behavioral: Negative.        Objective:   Physical Exam  Constitutional: He is oriented to person, place, and time. He appears well-developed and well-nourished. No distress.  The patient is pleasant and alert  HENT:  Head: Normocephalic and atraumatic.  Right Ear: External ear normal.  Left Ear: External ear normal.  Nose: Nose normal.  Mouth/Throat: Oropharynx is clear and moist. No oropharyngeal exudate.  Eyes: Pupils are equal, round, and reactive to light. Conjunctivae and EOM are normal. Right eye exhibits no discharge. Left eye exhibits no discharge. No scleral icterus.  Follow-up with my eye doctor as planned to make sure we get a copy of the note from the next visit  Neck: Normal range of motion. Neck supple. No thyromegaly present.  No bruits thyromegaly or anterior cervical adenopathy  Cardiovascular: Normal rate,  regular rhythm, normal heart sounds and intact distal pulses.   No murmur heard. Heart has a regular rate and rhythm at 72/min  Pulmonary/Chest: Effort normal and breath sounds normal. No respiratory distress. He has no wheezes. He has no rales. He exhibits no tenderness.  Clear anteriorly and posteriorly with no axillary adenopathy  Abdominal: Soft. Bowel sounds are normal. He exhibits no mass. There is no tenderness. There is no rebound and no guarding.  Abdominal obesity without masses tenderness or organ enlargement or bruits  Musculoskeletal: Normal range of motion. He exhibits no edema.  Lymphadenopathy:    He has no cervical adenopathy.  Neurological: He is alert and oriented to person, place, and time. He has normal reflexes. No cranial nerve deficit.  Skin: Skin is warm and dry. No rash noted.  Psychiatric: He has a normal mood and affect. His behavior is normal. Judgment and thought content normal.  Nursing note and vitals reviewed.  BP 127/72 (BP Location: Left Arm)   Pulse 80   Temp (!) 97.3 F (36.3 C) (Oral)   Ht 5\' 9"  (1.753 m)   Wt 243 lb (110.2 kg)   BMI 35.88 kg/m         Assessment & Plan:  1. Type 2 diabetes mellitus without complication, without long-term current use of insulin (HCC) -Continue with current treatment pending results of the hemoglobin A1c - DG Chest 2 View; Future - Bayer DCA Hb A1c Waived  2. Pure hypercholesterolemia -Continue aggressive therapeutic lifestyle changes and current treatment for cholesterol - DG Chest 2 View; Future  3. Gastroesophageal reflux disease, esophagitis presence not specified -The patient is doing well with this currently and he will continue with his Nexium and diet of avoidance  4. Vitamin D deficiency -Continue with current treatment pending the next vitamin D check.  5. History of elevated PSA  6. Benign prostatic hyperplasia, unspecified whether lower urinary tract symptoms present -No problems with  voiding today.  7.  Morbid obesity -Based on a BMI of greater than 35 and 2 or more risk factors including diabetes and hyperlipidemia the patient has morbid obesity.  He will continue with as aggressive therapeutic lifestyle changes with all efforts to lose weight through diet and exercise  8.  Obstructive sleep apnea -The patient will continue to work with his insurance company on getting the equipment to use at nighttime because of this diagnosis from the pulmonologist.  Meds ordered this encounter  Medications  . rosuvastatin (CRESTOR) 40 MG tablet    Sig: Take 1 tablet (40 mg total) by mouth daily.    Dispense:  90 tablet    Refill:  3   Patient Instructions                       Medicare Annual Wellness Visit  [facility] and the medical providers at [facility] strive to bring you the best medical care.  In  doing so we not only want to address your current medical conditions and concerns but also to detect new conditions early and prevent illness, disease and health-related problems.    Medicare offers a yearly Wellness Visit which allows our clinical staff to assess your need for preventative services including immunizations, lifestyle education, counseling to decrease risk of preventable diseases and screening for fall risk and other medical concerns.    This visit is provided free of charge (no copay) for all Medicare recipients. The clinical pharmacists at [facility] have begun to conduct these Wellness Visits which will also include a thorough review of all your medications.    As you primary medical provider recommend that you make an appointment for your Annual Wellness Visit if you have not done so already this year.  You may set up this appointment before you leave today or you may call back ([phone]) and schedule an appointment.  Please make sure when you call that you mention that you are scheduling your Annual Wellness Visit  with the clinical pharmacist so that the appointment may be made for the proper length of time.     Continue current medications. Continue good therapeutic lifestyle changes which include good diet and exercise. Fall precautions discussed with patient. If an FOBT was given today- please return it to our front desk. If you are over 57 years old - you may need Prevnar 60 or the adult Pneumonia vaccine.  **Flu shots are available--- please call and schedule a FLU-CLINIC appointment**  After your visit with [place] today you will receive a survey in the mail or online from [employer] regarding your care with [place]. Please take a moment to fill this out. Your feedback is very important to [place] as you can help [place] better understand your patient needs as well as improve your experience and satisfaction. WE CARE ABOUT YOU!!!   Continue with aggressive therapeutic lifestyle changes Check with your insurance about the new shingles shot and see if they will cover this if you get one. Let us know we can give you this at the next visit Continue to drink plenty of fluids stay active lose weight Check with your insurance regarding the new shingles shot that is available to see if you have coverage for this. Call us back if you still have issues with your insurance paying for the machine for sleep apnea and speak with either Suanne Marker or Kenney Houseman, speak with Dr. Janee Morn office first as they may can help you as I am sure they have had similar situations like yours in the past  Arrie Senate MD

## 2016-12-13 ENCOUNTER — Other Ambulatory Visit: Payer: Self-pay | Admitting: *Deleted

## 2016-12-13 ENCOUNTER — Ambulatory Visit: Payer: BLUE CROSS/BLUE SHIELD

## 2016-12-13 ENCOUNTER — Other Ambulatory Visit (INDEPENDENT_AMBULATORY_CARE_PROVIDER_SITE_OTHER): Payer: BLUE CROSS/BLUE SHIELD

## 2016-12-13 DIAGNOSIS — R9389 Abnormal findings on diagnostic imaging of other specified body structures: Secondary | ICD-10-CM

## 2016-12-13 DIAGNOSIS — Z0000 Encounter for general adult medical examination without abnormal findings: Secondary | ICD-10-CM

## 2016-12-15 NOTE — Assessment & Plan Note (Signed)
I tried to be as specific as I could while answering his many questions. He agreed to let us put in an order for CPAP so that he could talk directly to a home care company about insurance and financial coverage. Plan-order AutoPap 5-20

## 2016-12-15 NOTE — Assessment & Plan Note (Signed)
I explained that weight loss woul help manage his obstructive sleep apnea, as well as his diabetes.

## 2017-01-28 ENCOUNTER — Other Ambulatory Visit: Payer: Self-pay | Admitting: Family Medicine

## 2017-02-07 ENCOUNTER — Other Ambulatory Visit: Payer: Self-pay

## 2017-02-07 MED ORDER — PHENTERMINE HCL 37.5 MG PO TABS: 37.5000 mg | ORAL_TABLET | Freq: Every day | ORAL | 1 refills | Status: DC

## 2017-02-07 NOTE — Telephone Encounter (Signed)
Last seen 12/12/16  DWM

## 2017-02-07 NOTE — Telephone Encounter (Signed)
Phoned in.

## 2017-03-20 ENCOUNTER — Ambulatory Visit: Payer: BLUE CROSS/BLUE SHIELD | Admitting: Internal Medicine

## 2017-03-27 ENCOUNTER — Other Ambulatory Visit: Payer: Self-pay | Admitting: Family Medicine

## 2017-04-03 ENCOUNTER — Ambulatory Visit: Payer: BLUE CROSS/BLUE SHIELD | Admitting: Internal Medicine

## 2017-05-09 ENCOUNTER — Encounter: Payer: Self-pay | Admitting: Family Medicine

## 2017-05-21 ENCOUNTER — Ambulatory Visit: Payer: BLUE CROSS/BLUE SHIELD | Admitting: Family Medicine

## 2017-05-21 ENCOUNTER — Encounter: Payer: Self-pay | Admitting: Family Medicine

## 2017-05-21 VITALS — BP 119/64 | HR 74 | Temp 97.1°F | Ht 69.0 in | Wt 243.4 lb

## 2017-05-21 DIAGNOSIS — H6501 Acute serous otitis media, right ear: Secondary | ICD-10-CM

## 2017-05-21 DIAGNOSIS — M545 Low back pain, unspecified: Secondary | ICD-10-CM

## 2017-05-21 MED ORDER — AMOXICILLIN-POT CLAVULANATE 875-125 MG PO TABS: 1.0000 | ORAL_TABLET | Freq: Two times a day (BID) | ORAL | 0 refills | Status: DC

## 2017-05-21 MED ORDER — DICLOFENAC SODIUM 75 MG PO TBEC: 75.0000 mg | DELAYED_RELEASE_TABLET | Freq: Two times a day (BID) | ORAL | 0 refills | Status: DC

## 2017-05-21 NOTE — Patient Instructions (Signed)

## 2017-05-21 NOTE — Progress Notes (Signed)
Subjective:  Patient ID: Paul Johnston, male    DOB: 17-Oct-1950  Age: 67 y.o. MRN: [id number]  CC: Otalgia (pt here today c/o right ear ache for 2-3 days)   HPI Philip Hinch presents for acute pain in the right ear onset 2 days ago it has been sharp off and on associated with some upper respiratory congestion.  He tends to have some congestion as well.  He has had no fever chills or sweats.  He says that it started about 4 days ago he was outside spraying Roundup.  He started having a bit of drainage.  The next day he also developed some low back pain.  Although he has had pain in the past and has treated that with hydrocodone and it has radiated down the legs it has not been radiating this time.  Pain is moderate.  It has not kept him out of work.  However, the ear pain did keep him out of work today due to the severity.  Depression screen [facility] 2/9 12/12/2016 03/15/2016 01/11/2016  Decreased Interest 0 0 0  Down, Depressed, Hopeless 0 0 0  PHQ - 2 Score 0 0 0    History Jarvis has a past medical history of Allergy, Arthritis, Diabetes mellitus without complication ([place]), GERD (gastroesophageal reflux disease), Hyperlipidemia, Ruptured intervertebral disc, and Ulcer.   He has a past surgical history that includes Appendectomy; Orbital fracture repair; Colonoscopy; Upper gi endoscopy; Umbilical hernia repair (N/A, 07/16/2012); and Insertion of mesh (N/A, 07/16/2012).   His family history includes Cancer in his mother; Emphysema in his father; Heart disease in his paternal grandfather; Heart failure in his father.He reports that he quit smoking about 8 years ago. His smoking use included cigarettes. He has a 33.00 pack-year smoking history. He has never used smokeless tobacco. He reports that he does not drink alcohol or use drugs.    ROS Review of Systems  Constitutional: Positive for activity change. Negative for appetite change and fever.  HENT: Positive for congestion and postnasal drip.  Negative for rhinorrhea.   Eyes: Negative for discharge and redness.  Respiratory: Negative for cough and shortness of breath.   Musculoskeletal: Positive for arthralgias and back pain.    Objective:  BP 119/64   Pulse 74   Temp (!) 97.1 F (36.2 C) (Oral)   Ht 5\' 9"  (1.753 m)   Wt 243 lb 6 oz (110.4 kg)   BMI 35.94 kg/m   BP Readings from Last 3 Encounters:  05/21/17 119/64  12/12/16 127/72  12/04/16 118/78    Wt Readings from Last 3 Encounters:  05/21/17 243 lb 6 oz (110.4 kg)  12/12/16 243 lb (110.2 kg)  12/04/16 242 lb 9.6 oz (110 kg)     Physical Exam  Constitutional: He is oriented to person, place, and time. He appears well-developed and well-nourished.  HENT:  Head: Normocephalic and atraumatic.  Right Ear: Tympanic membrane and external ear normal. No decreased hearing is noted.  Left Ear: External ear normal. Tympanic membrane is injected and erythematous. No decreased hearing is noted.  Nose: Mucosal edema present. Right sinus exhibits no frontal sinus tenderness. Left sinus exhibits no frontal sinus tenderness.  Mouth/Throat: No oropharyngeal exudate or posterior oropharyngeal erythema.  Neck: No Brudzinski's sign noted.  Pulmonary/Chest: Breath sounds normal. No respiratory distress.  Musculoskeletal: Normal range of motion. He exhibits tenderness (at midline L4-5 region). He exhibits no edema or deformity.  Lymphadenopathy:       Head (right side): No preauricular  adenopathy present.       Head (left side): No preauricular adenopathy present.       Right cervical: No superficial cervical adenopathy present.      Left cervical: No superficial cervical adenopathy present.  Neurological: He is alert and oriented to person, place, and time.  Skin: Skin is warm and dry.  Psychiatric: He has a normal mood and affect.      Assessment & Plan:   Josmar was seen today for otalgia.  Diagnoses and all orders for this visit:  Acute midline low back pain  without sciatica  Right acute serous otitis media, recurrence not specified  Other orders -     amoxicillin-clavulanate (AUGMENTIN) 875-125 MG tablet; Take 1 tablet by mouth 2 (two) times daily. -     diclofenac (VOLTAREN) 75 MG EC tablet; Take 1 tablet (75 mg total) by mouth 2 (two) times daily.       I am having Yusuke Gravely start on amoxicillin-clavulanate and diclofenac. I am also having him maintain his aspirin, fish oil-omega-3 fatty acids, ranitidine, glucose blood, onetouch ultrasoft, Vitamin D3, rosuvastatin, esomeprazole, phentermine, and metFORMIN.  Allergies as of 05/21/2017      Reactions   Codeine    Biaxin [clarithromycin] Other (See Comments)   GI upset   Mucinex [guaifenesin Er] Nausea And Vomiting      Medication List        Accurate as of 05/21/17  4:06 PM. Always use your most recent med list.          amoxicillin-clavulanate 875-125 MG tablet Commonly known as:  AUGMENTIN Take 1 tablet by mouth 2 (two) times daily.   aspirin 81 MG tablet Take 81 mg by mouth daily.   diclofenac 75 MG EC tablet Commonly known as:  VOLTAREN Take 1 tablet (75 mg total) by mouth 2 (two) times daily.   esomeprazole 40 MG capsule Commonly known as:  NEXIUM TAKE 1 CAPSULE DAILY AT 12 NOON   fish oil-omega-3 fatty acids 1000 MG capsule Take 2 g by mouth daily.   glucose blood test strip Use as instructed   metFORMIN 500 MG tablet Commonly known as:  GLUCOPHAGE TAKE 1 TABLET TWICE A DAY WITH MEALS   onetouch ultrasoft lancets Use as instructed   phentermine 37.5 MG tablet Commonly known as:  ADIPEX-P Take 1 tablet (37.5 mg total) by mouth daily.   ranitidine 150 MG tablet Commonly known as:  ZANTAC Take 150 mg by mouth as needed for heartburn.   rosuvastatin 40 MG tablet Commonly known as:  CRESTOR Take 1 tablet (40 mg total) by mouth daily.   Vitamin D3 5000 units Caps Take by mouth. 1 a day       Patient was also very concerned that he get his  phentermine refilled today as part of his acute visit.  He said he was not to make a separate visit here just to get contaminant removed.  Patient educated with regard to needing to see his primary provider within the practice for the sake of refills for controlled substances.  He said it does not work that well anyway and he will just do without. Follow-up: Return if symptoms worsen or fail to improve.  Claretta Fraise, M.D.

## 2017-05-23 ENCOUNTER — Other Ambulatory Visit: Payer: Self-pay | Admitting: Family Medicine

## 2017-06-05 ENCOUNTER — Encounter: Payer: Self-pay | Admitting: Family Medicine

## 2017-06-09 ENCOUNTER — Encounter: Payer: Self-pay | Admitting: Family Medicine

## 2017-06-11 ENCOUNTER — Encounter: Payer: Self-pay | Admitting: Family Medicine

## 2017-06-11 ENCOUNTER — Telehealth: Payer: Self-pay | Admitting: Family Medicine

## 2017-06-11 ENCOUNTER — Ambulatory Visit (INDEPENDENT_AMBULATORY_CARE_PROVIDER_SITE_OTHER): Payer: BLUE CROSS/BLUE SHIELD | Admitting: Family Medicine

## 2017-06-11 VITALS — BP 122/63 | HR 72 | Temp 97.1°F | Ht 69.0 in | Wt 248.0 lb

## 2017-06-11 DIAGNOSIS — E7800 Pure hypercholesterolemia, unspecified: Secondary | ICD-10-CM | POA: Diagnosis not present

## 2017-06-11 DIAGNOSIS — Z0000 Encounter for general adult medical examination without abnormal findings: Secondary | ICD-10-CM | POA: Diagnosis not present

## 2017-06-11 DIAGNOSIS — K219 Gastro-esophageal reflux disease without esophagitis: Secondary | ICD-10-CM

## 2017-06-11 DIAGNOSIS — Z23 Encounter for immunization: Secondary | ICD-10-CM

## 2017-06-11 DIAGNOSIS — Z136 Encounter for screening for cardiovascular disorders: Secondary | ICD-10-CM | POA: Diagnosis not present

## 2017-06-11 DIAGNOSIS — Z87898 Personal history of other specified conditions: Secondary | ICD-10-CM

## 2017-06-11 DIAGNOSIS — E875 Hyperkalemia: Secondary | ICD-10-CM

## 2017-06-11 DIAGNOSIS — R7989 Other specified abnormal findings of blood chemistry: Secondary | ICD-10-CM

## 2017-06-11 DIAGNOSIS — E119 Type 2 diabetes mellitus without complications: Secondary | ICD-10-CM

## 2017-06-11 DIAGNOSIS — W57XXXA Bitten or stung by nonvenomous insect and other nonvenomous arthropods, initial encounter: Secondary | ICD-10-CM

## 2017-06-11 DIAGNOSIS — E559 Vitamin D deficiency, unspecified: Secondary | ICD-10-CM

## 2017-06-11 DIAGNOSIS — N400 Enlarged prostate without lower urinary tract symptoms: Secondary | ICD-10-CM

## 2017-06-11 DIAGNOSIS — R945 Abnormal results of liver function studies: Secondary | ICD-10-CM

## 2017-06-11 DIAGNOSIS — Z1159 Encounter for screening for other viral diseases: Secondary | ICD-10-CM

## 2017-06-11 MED ORDER — DOXYCYCLINE HYCLATE 100 MG PO TABS: 100.0000 mg | ORAL_TABLET | Freq: Two times a day (BID) | ORAL | 0 refills | Status: DC

## 2017-06-11 NOTE — Progress Notes (Signed)
Subjective:    Patient ID: Paul Johnston, male    DOB: 1951-01-17, 67 y.o.   MRN: [id number]  HPI Patient is here today for annual wellness exam and follow up of chronic medical problems which includes diabetes, hypertension and hyperlipidemia. He is taking medication regularly.  Patient is doing well overall but has had a recent episode of cough and congestion.  He has had lab work done recently and this will be reviewed with him during the visit today.  This was done at an outside lab.  The blood sugar uric acid and creatinine were all good.  The potassium was elevated by 1/10 of a point.  The LDH was also elevated.  The white blood cell count was elevated.  Hemoglobin A1c was good at 6.7%.  The patient has a history of cigarette smoking and has been told that that increases the risk of abdominal aneurysms.  He would like to have an ultrasound of his abdomen schedule and we will do that.  After reviewing his lab work today, we will repeat his potassium liver function test and CBC because of the elevated white blood cell count.  He also needs to get a PSA.  He will get an EKG and is up-to-date on his chest x-rays.  He still has somewhat of a cough and he has had no fever.  The sputum is light in color.  The patient denies any chest pain or shortness of breath other than having getting over the recent cough and congestion.  He denies any trouble with his stomach including nausea vomiting diarrhea or blood in the stool.  He is passing his water with no burning pain or frequency but does have occasional nocturia.  He also removed a tick from the scrotal area recently that was attached.  It was not a deer tick according to the patient.  He is currently being followed by the ophthalmologist because of borderline increased pressure in his eyes.  He is being followed regularly for this.  Patient's family history was recently reviewed also.  His father died from complications of emphysema and his mother died in her  62s of congestive heart failure.  He had 4/2 siblings and all of those have died 1 died from alcohol intake another died from an accidental gunshot wound and one sister died from complications of a bowel infarction.  He is the youngest and only survivor.  The patient plans to retire the end of this year.     Patient Active Problem List   Diagnosis Date Noted  . Obstructive sleep apnea 09/15/2016  . Obesity (BMI 30-39.9) 05/17/2015  . Diabetes mellitus without complication ([place])   . Morbid obesity ([place]) 12/09/2013  . Elevated blood sugar 12/09/2013  . GERD (gastroesophageal reflux disease) 09/01/2012  . Vitamin D deficiency 09/01/2012  . Solitary pulmonary nodule, history of, left side 09/01/2012  . BPH (benign prostatic hyperplasia) 09/01/2012  . Umbilical hernia 19/14/7829  . Hyperlipidemia 12/14/2008  . Tobacco use disorder in remission 12/14/2008   Outpatient Encounter Medications as of 06/11/2017  Medication Sig  . aspirin 81 MG tablet Take 81 mg by mouth daily.  . Cholecalciferol (VITAMIN D3) 5000 units CAPS Take by mouth. 1 a day  . esomeprazole (NEXIUM) 40 MG capsule TAKE 1 CAPSULE DAILY AT 12 NOON  . fish oil-omega-3 fatty acids 1000 MG capsule Take 2 g by mouth daily.  Marland Kitchen glucose blood test strip Use as instructed  . Lancets (ONETOUCH ULTRASOFT) lancets Use as instructed  .  metFORMIN (GLUCOPHAGE) 500 MG tablet TAKE 1 TABLET TWICE A DAY WITH MEALS  . ranitidine (ZANTAC) 150 MG tablet Take 150 mg by mouth as needed for heartburn.  . rosuvastatin (CRESTOR) 40 MG tablet Take 1 tablet (40 mg total) by mouth daily.  . diclofenac (VOLTAREN) 75 MG EC tablet Take 1 tablet (75 mg total) by mouth 2 (two) times daily. (Patient not taking: Reported on 06/11/2017)  . phentermine (ADIPEX-P) 37.5 MG tablet Take 1 tablet (37.5 mg total) by mouth daily. (Patient not taking: Reported on 05/21/2017)  . [DISCONTINUED] amoxicillin-clavulanate (AUGMENTIN) 875-125 MG tablet Take 1 tablet by mouth 2 (two)  times daily.   No facility-administered encounter medications on file as of 06/11/2017.      Review of Systems  Constitutional: Negative.   HENT: Positive for congestion (getting better ).   Eyes: Negative.   Respiratory: Positive for cough.   Cardiovascular: Negative.   Gastrointestinal: Negative.   Endocrine: Negative.   Genitourinary: Negative.   Musculoskeletal: Negative.   Skin: Negative.   Allergic/Immunologic: Negative.   Neurological: Negative.   Hematological: Negative.   Psychiatric/Behavioral: Negative.        Objective:   Physical Exam  Constitutional: He is oriented to person, place, and time. He appears well-developed and well-nourished. No distress.  Patient is pleasant and alert.  HENT:  Head: Normocephalic and atraumatic.  Right Ear: External ear normal.  Left Ear: External ear normal.  Mouth/Throat: Oropharynx is clear and moist. No oropharyngeal exudate.  Nasal turbinate congestion left greater than right  Eyes: Pupils are equal, round, and reactive to light. Conjunctivae and EOM are normal. Right eye exhibits no discharge. Left eye exhibits no discharge. No scleral icterus.  Patient is following up regularly with his ophthalmologist because of increased pressure  Neck: Normal range of motion. Neck supple. No thyromegaly present.  No bruits thyromegaly or anterior cervical adenopathy  Cardiovascular: Normal rate, regular rhythm, normal heart sounds and intact distal pulses. Exam reveals no gallop and no friction rub.  No murmur heard. Heart has a regular rate and rhythm at 72/min with good pedal pulses and no edema.  Pulmonary/Chest: Effort normal and breath sounds normal. No respiratory distress. He has no wheezes. He has no rales. He exhibits no tenderness.  Lungs are clear today with no wheezes and minimal congestion in the bronchial tree with coughing.  No chest wall masses and no axillary adenopathy  Abdominal: Soft. Bowel sounds are normal. He exhibits  no mass. There is no tenderness. There is no rebound and no guarding.  Abdomen is obese without liver or spleen enlargement epigastric tenderness bruits or inguinal adenopathy.  Genitourinary: Rectum normal, prostate normal and penis normal.  Genitourinary Comments: Tick bite wound noted on lower side of penis.  Minimal swelling.  The external genitalia were within normal limits with no scrotal masses and no inguinal hernias palpable.  Rectal exam was negative with no masses or lumps in the prostate was slightly enlarged without lumps or masses.  Musculoskeletal: Normal range of motion. He exhibits no edema or tenderness.  Lymphadenopathy:    He has no cervical adenopathy.  Neurological: He is alert and oriented to person, place, and time. He has normal reflexes. No cranial nerve deficit.  Skin: Skin is warm and dry. No rash noted. There is erythema. No pallor.  Tick bite wound noticed on lower surface of penis.  Slight redness.  Psychiatric: He has a normal mood and affect. His behavior is normal. Judgment and thought content  normal.  Nursing note and vitals reviewed.  BP 122/63 (BP Location: Left Arm)   Pulse 72   Temp (!) 97.1 F (36.2 C) (Oral)   Ht _0  (1.753 m)   Wt 248 lb (112.5 kg)   BMI 36.62 kg/m   EKG with results pending===     Assessment & Plan:  1. Annual physical exam - EKG 12-Lead - Urinalysis, Complete - PSA, total and free - CBC with Differential/Platelet - VITAMIN D 25 Hydroxy (Vit-D Deficiency, Fractures)  2. Type 2 diabetes mellitus without complication, without long-term current use of insulin (HCC) -Continue to monitor blood sugars regularly stay active lose weight - CBC with Differential/Platelet  3. Pure hypercholesterolemia -LDL C was good and at goal from outside testing - EKG 12-Lead - CBC with Differential/Platelet  4. Gastroesophageal reflux disease, esophagitis presence not specified -Patient continues to take Nexium.  His hemoglobin is  stable he will continue to take a coated baby aspirin once daily. - CBC with Differential/Platelet  5. History of elevated PSA -Patient has an enlarged prostate and has occasional nocturia. - PSA, total and free - CBC with Differential/Platelet  6. Vitamin D deficiency -10 you current treatment pending results of lab work - CBC with Differential/Platelet - VITAMIN D 25 Hydroxy (Vit-D Deficiency, Fractures)  7. Benign prostatic hyperplasia, unspecified whether lower urinary tract symptoms present -Check PSA and get urinalysis - Urinalysis, Complete - CBC with Differential/Platelet  8. Elevated liver function tests -Repeat liver function test - Hepatic function panel  9. Increased potassium in the blood -Repeat potassium - BMP8+EGFR  10. Tick bite, initial encounter -Take doxycycline 100 mg twice daily with food for 2 weeks - [place] mtn spotted fvr abs pnl(IgG+IgM) - Lyme Ab/Western Blot Reflex  11. Screening for measles - Measles/Mumps/Rubella Immunity  12. Morbid obesity ([place]) -The patient will continue to work aggressively on weight loss through diet and exercise.  He has morbid obesity based on the fact that he has a BMI greater than 35 with 2 or more comorbid risk factors.  Patient Instructions  Continue current medications. Continue good therapeutic lifestyle changes which include good diet and exercise. Fall precautions discussed with patient. If an FOBT was given today- please return it to our front desk. If you are over 52 years old - you may need Prevnar 56 or the adult Pneumonia vaccine.  **Flu shots are available--- please call and schedule a FLU-CLINIC appointment**  After your visit with [place] today you will receive a survey in the mail or online from [employer] regarding your care with [place]. Please take a moment to fill this out. Your feedback is very important to [place] as you can help [place] better understand your patient needs as well as improve your experience and  satisfaction. WE CARE ABOUT YOU!!!   We will call with the additional lab work to follow-up on abnormalities as soon as that becomes available.  This would include a potassium repeat liver function test CBC PSA and a titer to check your immunity for measles along with a vitamin D level. Because of the recent tick bite we will ask you to take doxycycline 100 mg twice daily with food for 2 weeks We will also be checking for any rising titers to tick bites including the deer tick as well as the regular dog tick. You will receive your Pneumovax today and he will not need any further pneumonia shots after the shot today. Because of the ongoing cough, he should continue to  take Mucinex, maximum strength, plain 1 twice daily for cough and congestion with a large glass of water for 10-14 more days.   Arrie Senate MD

## 2017-06-11 NOTE — Patient Instructions (Addendum)
Continue current medications. Continue good therapeutic lifestyle changes which include good diet and exercise. Fall precautions discussed with patient. If an FOBT was given today- please return it to our front desk. If you are over 66 years old - you may need Prevnar 78 or the adult Pneumonia vaccine.  **Flu shots are available--- please call and schedule a FLU-CLINIC appointment**  After your visit with [place] today you will receive a survey in the mail or online from [employer] regarding your care with [place]. Please take a moment to fill this out. Your feedback is very important to [place] as you can help [place] better understand your patient needs as well as improve your experience and satisfaction. WE CARE ABOUT YOU!!!   We will call with the additional lab work to follow-up on abnormalities as soon as that becomes available.  This would include a potassium repeat liver function test CBC PSA and a titer to check your immunity for measles along with a vitamin D level. Because of the recent tick bite we will ask you to take doxycycline 100 mg twice daily with food for 2 weeks We will also be checking for any rising titers to tick bites including the deer tick as well as the regular dog tick. You will receive your Pneumovax today and he will not need any further pneumonia shots after the shot today. Because of the ongoing cough, he should continue to take Mucinex, maximum strength, plain 1 twice daily for cough and congestion with a large glass of water for 10-14 more days.

## 2017-06-11 NOTE — Telephone Encounter (Signed)
[facility]

## 2017-06-12 LAB — MICROSCOPIC EXAMINATION
Bacteria, UA: NONE SEEN
Epithelial Cells (non renal): NONE SEEN /hpf (ref 0–10)
RBC MICROSCOPIC, UA: NONE SEEN /HPF (ref 0–2)
Renal Epithel, UA: NONE SEEN /hpf
WBC UA: NONE SEEN /HPF (ref 0–5)

## 2017-06-12 LAB — URINALYSIS, COMPLETE
Bilirubin, UA: NEGATIVE
Glucose, UA: NEGATIVE
Ketones, UA: NEGATIVE
LEUKOCYTES UA: NEGATIVE
Nitrite, UA: NEGATIVE
PH UA: 7 (ref 5.0–7.5)
RBC, UA: NEGATIVE
Specific Gravity, UA: 1.02 (ref 1.005–1.030)
Urobilinogen, Ur: >8 mg/dL — ABNORMAL HIGH (ref 0.2–1.0)

## 2017-06-13 LAB — CBC WITH DIFFERENTIAL/PLATELET
BASOS ABS: 0 10*3/uL (ref 0.0–0.2)
Basos: 0 %
EOS (ABSOLUTE): 0.3 10*3/uL (ref 0.0–0.4)
EOS: 3 %
HEMATOCRIT: 43.6 % (ref 37.5–51.0)
Hemoglobin: 14.4 g/dL (ref 13.0–17.7)
IMMATURE GRANULOCYTES: 0 %
Immature Grans (Abs): 0 10*3/uL (ref 0.0–0.1)
Lymphocytes Absolute: 2.6 10*3/uL (ref 0.7–3.1)
Lymphs: 26 %
MCH: 28.7 pg (ref 26.6–33.0)
MCHC: 33 g/dL (ref 31.5–35.7)
MCV: 87 fL (ref 79–97)
MONOCYTES: 8 %
MONOS ABS: 0.8 10*3/uL (ref 0.1–0.9)
NEUTROS PCT: 63 %
Neutrophils Absolute: 6.1 10*3/uL (ref 1.4–7.0)
Platelets: 209 10*3/uL (ref 150–379)
RBC: 5.02 x10E6/uL (ref 4.14–5.80)
RDW: 14.2 % (ref 12.3–15.4)
WBC: 9.8 10*3/uL (ref 3.4–10.8)

## 2017-06-13 LAB — HEPATIC FUNCTION PANEL
ALT: 25 IU/L (ref 0–44)
AST: 30 IU/L (ref 0–40)
Albumin: 4.3 g/dL (ref 3.6–4.8)
Alkaline Phosphatase: 45 IU/L (ref 39–117)
Bilirubin Total: 0.5 mg/dL (ref 0.0–1.2)
Bilirubin, Direct: 0.17 mg/dL (ref 0.00–0.40)
TOTAL PROTEIN: 7.2 g/dL (ref 6.0–8.5)

## 2017-06-13 LAB — BMP8+EGFR
BUN / CREAT RATIO: 18 (ref 10–24)
BUN: 18 mg/dL (ref 8–27)
CHLORIDE: 101 mmol/L (ref 96–106)
CO2: 25 mmol/L (ref 20–29)
CREATININE: 1.02 mg/dL (ref 0.76–1.27)
Calcium: 9.1 mg/dL (ref 8.6–10.2)
GFR calc Af Amer: 89 mL/min/{1.73_m2} (ref >59)
GFR calc non Af Amer: 77 mL/min/{1.73_m2} (ref >59)
GLUCOSE: 102 mg/dL — AB (ref 65–99)
POTASSIUM: 4.1 mmol/L (ref 3.5–5.2)
SODIUM: 139 mmol/L (ref 134–144)

## 2017-06-13 LAB — PSA, TOTAL AND FREE
PROSTATE SPECIFIC AG, SERUM: 0.8 ng/mL (ref 0.0–4.0)
PSA FREE PCT: 51.3 %
PSA, Free: 0.41 ng/mL

## 2017-06-13 LAB — LYME AB/WESTERN BLOT REFLEX
LYME DISEASE AB, QUANT, IGM: <0.8 index (ref 0.00–0.79)
Lyme IgG/IgM Ab: <0.91 {ISR} (ref 0.00–0.90)

## 2017-06-13 LAB — ROCKY MTN SPOTTED FVR ABS PNL(IGG+IGM)
RMSF IGG: NEGATIVE
RMSF IGM: 0.66 {index} (ref 0.00–0.89)

## 2017-06-13 LAB — MEASLES/MUMPS/RUBELLA IMMUNITY
MUMPS ABS, IGG: 104 AU/mL (ref >10.9)
RUBELLA: 15.1 {index} (ref >0.99)

## 2017-06-13 LAB — VITAMIN D 25 HYDROXY (VIT D DEFICIENCY, FRACTURES): VIT D 25 HYDROXY: 55.9 ng/mL (ref 30.0–100.0)

## 2017-06-16 ENCOUNTER — Encounter: Payer: Self-pay | Admitting: Family Medicine

## 2017-06-19 ENCOUNTER — Ambulatory Visit (HOSPITAL_COMMUNITY): Payer: BLUE CROSS/BLUE SHIELD

## 2017-06-20 ENCOUNTER — Ambulatory Visit (HOSPITAL_COMMUNITY)
Admission: RE | Admit: 2017-06-20 | Discharge: 2017-06-20 | Disposition: A | Discharge status: Home / Self Care | Payer: BLUE CROSS/BLUE SHIELD | Source: Ambulatory Visit | Attending: Family Medicine | Admitting: Family Medicine

## 2017-06-20 DIAGNOSIS — Z136 Encounter for screening for cardiovascular disorders: Secondary | ICD-10-CM | POA: Diagnosis present

## 2017-06-20 DIAGNOSIS — I77811 Abdominal aortic ectasia: Secondary | ICD-10-CM | POA: Insufficient documentation

## 2017-07-28 IMAGING — CR DG CHEST 2V
2 series · 2 of 2 positions shown · non-contrast
Comparison: Chest x-ray 12/02/2012.  CT chest 02/06/2009

CLINICAL DATA: Hyperlipidemia.

EXAM:
CHEST  2 VIEW

[view not recorded (1 of 2)]
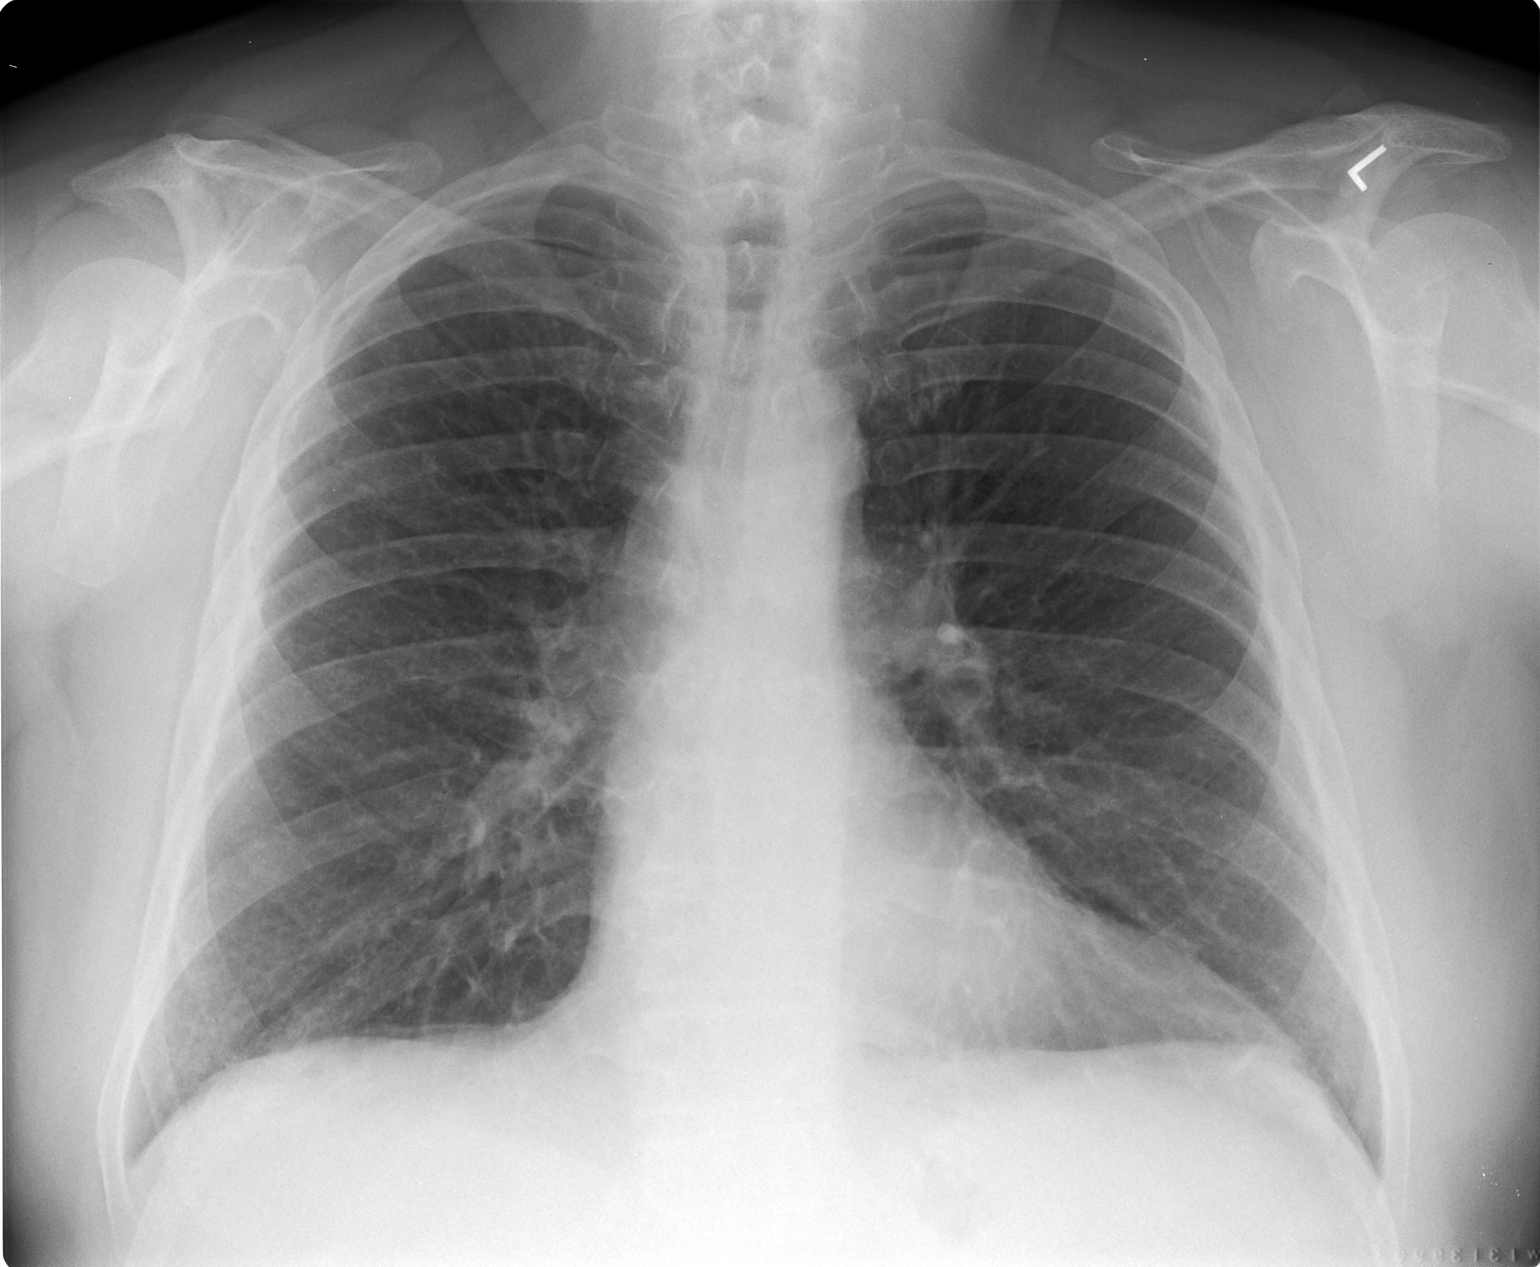

[view not recorded (2 of 2)]
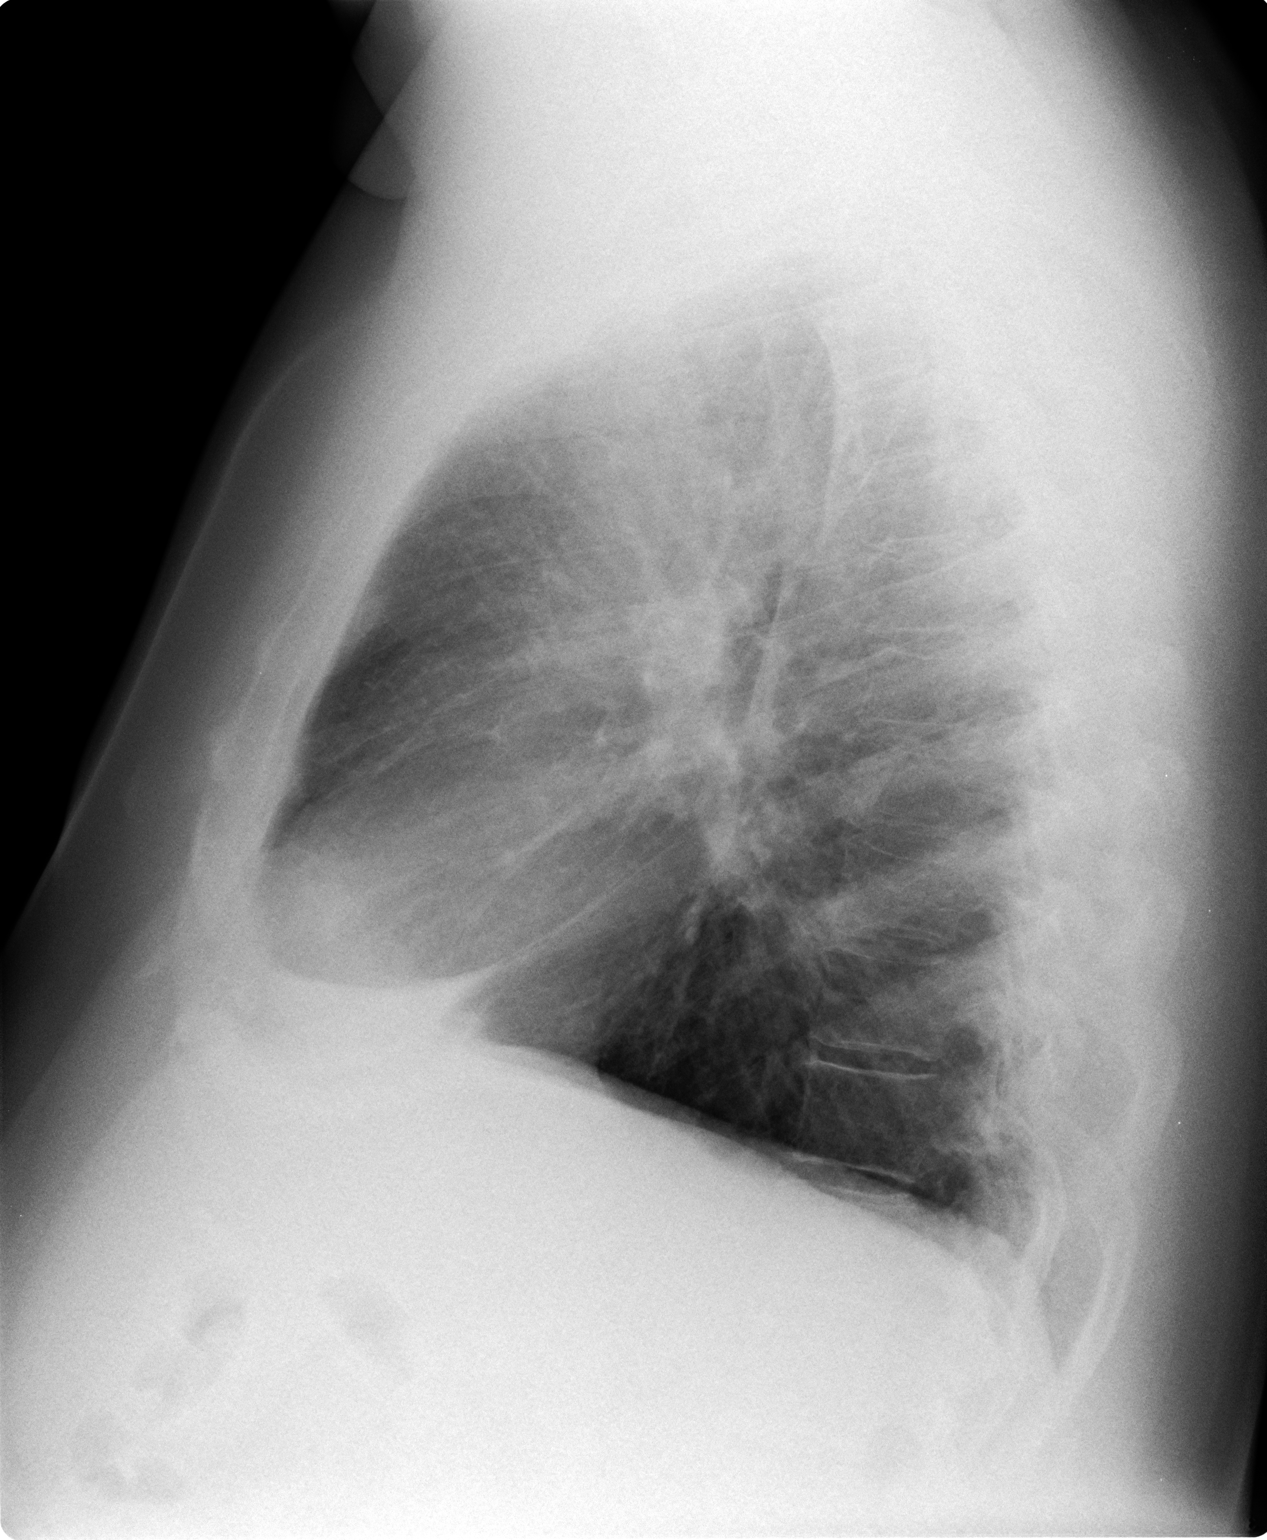

[2 of 2 positions shown; findings below may reference images not displayed]

FINDINGS: Mediastinum hilar structures normal. Lungs are clear of acute
infiltrate. Bibasilar pleural-parenchymal thickening noted
consistent scarring. Heart size normal. No pleural effusion or
pneumothorax. Chest stable prior exam.
IMPRESSION: No acute cardiopulmonary disease.

## 2017-10-22 ENCOUNTER — Other Ambulatory Visit: Payer: Self-pay | Admitting: *Deleted

## 2017-10-22 ENCOUNTER — Encounter: Payer: Self-pay | Admitting: Family Medicine

## 2017-10-22 MED ORDER — ROSUVASTATIN CALCIUM 40 MG PO TABS: 40.0000 mg | ORAL_TABLET | Freq: Every day | ORAL | 3 refills | Status: DC

## 2017-10-22 MED ORDER — ESOMEPRAZOLE MAGNESIUM 40 MG PO CPDR: DELAYED_RELEASE_CAPSULE | ORAL | 3 refills | Status: DC

## 2017-10-22 MED ORDER — METFORMIN HCL 500 MG PO TABS: 500.0000 mg | ORAL_TABLET | Freq: Two times a day (BID) | ORAL | 3 refills | Status: DC

## 2017-10-29 ENCOUNTER — Encounter: Payer: Self-pay | Admitting: *Deleted

## 2017-12-17 ENCOUNTER — Encounter: Payer: Self-pay | Admitting: Family Medicine

## 2017-12-17 ENCOUNTER — Ambulatory Visit (INDEPENDENT_AMBULATORY_CARE_PROVIDER_SITE_OTHER): Payer: PPO | Admitting: Family Medicine

## 2017-12-17 VITALS — BP 121/64 | HR 71 | Temp 97.2°F | Ht 69.0 in | Wt 232.0 lb

## 2017-12-17 DIAGNOSIS — E7800 Pure hypercholesterolemia, unspecified: Secondary | ICD-10-CM | POA: Diagnosis not present

## 2017-12-17 DIAGNOSIS — E119 Type 2 diabetes mellitus without complications: Secondary | ICD-10-CM | POA: Diagnosis not present

## 2017-12-17 DIAGNOSIS — K219 Gastro-esophageal reflux disease without esophagitis: Secondary | ICD-10-CM | POA: Diagnosis not present

## 2017-12-17 DIAGNOSIS — E559 Vitamin D deficiency, unspecified: Secondary | ICD-10-CM | POA: Diagnosis not present

## 2017-12-17 DIAGNOSIS — Z23 Encounter for immunization: Secondary | ICD-10-CM | POA: Diagnosis not present

## 2017-12-17 DIAGNOSIS — N400 Enlarged prostate without lower urinary tract symptoms: Secondary | ICD-10-CM

## 2017-12-17 LAB — BAYER DCA HB A1C WAIVED: HB A1C (BAYER DCA - WAIVED): 6.1 % (ref <7.0)

## 2017-12-17 LAB — HEMOGLOBIN A1C: Hemoglobin A1C: 6.1

## 2017-12-17 MED ORDER — ICOSAPENT ETHYL 1 G PO CAPS: 2.0000 | ORAL_CAPSULE | Freq: Two times a day (BID) | ORAL | 3 refills | Status: DC

## 2017-12-17 NOTE — Progress Notes (Signed)
Subjective:    Patient ID: [name], male    DOB: [birth year], 67 y.o.   MRN: [id number]  HPI Pt here for follow up and management of chronic medical problems which includes diabetes and hyperlipidemia. He is taking medication regularly.  This patient brings in blood work from the outside in the last blood work was done in April and we have already reviewed this.  The potassium was slightly increased at that time and the creatinine was normal.  The blood sugar is good at 92 and the uric acid was normal.  All of the liver function tests were normal except the alkaline phosphatase was slightly increased.  Cholesterol numbers were excellent with an LDL of 49 triglycerides good at 126 and the good cholesterol was low at 38.  The CBC at that time in April had a hemoglobin of 14 with a white blood cell count that was elevated.  He had a recent upper respiratory infection.  Hemoglobin A1c was 6.7%.  The patient does have a history of BPH diabetes mellitus GERD hyperlipidemia and morbid obesity.  He also has obstructive sleep apnea.  He is vitamin D deficient.  The patient is currently taking Nexium omega-3 fatty acids metformin 500 mg and Crestor.  He is also taking vitamin D 5000 units daily.  This patient took early retirement this summer in July.  He was planning on retiring in October but took it earlier.  He is having some problems with his wife and she is wanting to separate.  He has his home in [facility] [place] [facility] so he is thinking about moving back there soon.  He denies any chest pain pressure tightness or shortness of breath.  He denies any trouble with swallowing heartburn indigestion nausea vomiting diarrhea blood in the stool or black tarry bowel movements.  He is passing his water without problems he is due to get an eye exam soon and will schedule this in [facility].  We will try to help him find a primary care physician there and he will continue to come back here until he finds one.  His  hemoglobin A1c was done today and it was 6.1% improved from previously.  He has had a potassium done since the last one that we saw in April and the most recent potassium was normal.  He is lost 16 pounds of weight since May and he attributes this to all the stress he is going to do with his marriage.    Patient Active Problem List   Diagnosis Date Noted  . Obstructive sleep apnea 09/15/2016  . Obesity (BMI 30-39.9) 05/17/2015  . Diabetes mellitus without complication ([place])   . Morbid obesity ([place]) 12/09/2013  . Elevated blood sugar 12/09/2013  . GERD (gastroesophageal reflux disease) 09/01/2012  . Vitamin D deficiency 09/01/2012  . Solitary pulmonary nodule, history of, left side 09/01/2012  . BPH (benign prostatic hyperplasia) 09/01/2012  . Umbilical hernia 23/76/2831  . Hyperlipidemia 12/14/2008  . Tobacco use disorder in remission 12/14/2008   Outpatient Encounter Medications as of 12/17/2017  Medication Sig  . aspirin 81 MG tablet Take 81 mg by mouth daily.  . Cholecalciferol (VITAMIN D3) 5000 units CAPS Take by mouth. 1 a day  . esomeprazole (NEXIUM) 40 MG capsule TAKE 1 CAPSULE DAILY AT 12 NOON  . fish oil-omega-3 fatty acids 1000 MG capsule Take 2 g by mouth daily.  Marland Kitchen glucose blood test strip Use as instructed  . Lancets (ONETOUCH ULTRASOFT) lancets Use as instructed  .  metFORMIN (GLUCOPHAGE) 500 MG tablet Take 1 tablet (500 mg total) by mouth 2 (two) times daily with a meal.  . rosuvastatin (CRESTOR) 40 MG tablet Take 1 tablet (40 mg total) by mouth daily.  . [DISCONTINUED] diclofenac (VOLTAREN) 75 MG EC tablet Take 1 tablet (75 mg total) by mouth 2 (two) times daily. (Patient not taking: Reported on 06/11/2017)  . [DISCONTINUED] doxycycline (VIBRA-TABS) 100 MG tablet Take 1 tablet (100 mg total) by mouth 2 (two) times daily. 1 po bid  . [DISCONTINUED] phentermine (ADIPEX-P) 37.5 MG tablet Take 1 tablet (37.5 mg total) by mouth daily. (Patient not taking: Reported on 05/21/2017)    . [DISCONTINUED] ranitidine (ZANTAC) 150 MG tablet Take 150 mg by mouth as needed for heartburn.   No facility-administered encounter medications on file as of 12/17/2017.      Review of Systems  Constitutional: Negative.   HENT: Negative.   Eyes: Negative.   Respiratory: Negative.   Cardiovascular: Negative.   Gastrointestinal: Negative.   Endocrine: Negative.   Genitourinary: Negative.   Musculoskeletal: Negative.   Skin: Negative.   Allergic/Immunologic: Negative.   Neurological: Negative.   Hematological: Negative.   Psychiatric/Behavioral: Negative.        Objective:   Physical Exam  Constitutional: He is oriented to person, place, and time. He appears well-developed and well-nourished. No distress.  Patient is pleasant and doing well is somewhat stressed about the sudden knees from his wife that she did not want to live with him anymore.  HENT:  Head: Normocephalic and atraumatic.  Right Ear: External ear normal.  Left Ear: External ear normal.  Mouth/Throat: Oropharynx is clear and moist. No oropharyngeal exudate.  Some nasal congestion bilaterally  Eyes: Pupils are equal, round, and reactive to light. Conjunctivae and EOM are normal. Right eye exhibits no discharge. Left eye exhibits no discharge. No scleral icterus.  Patient understands to get eye exam and make sure that we get a copy of this record  Neck: Normal range of motion. Neck supple. No thyromegaly present.  No thyromegaly bruits or anterior cervical adenopathy  Cardiovascular: Normal rate, regular rhythm, normal heart sounds and intact distal pulses.  No murmur heard. Heart is regular at 72/min with good pedal pulses bilaterally and no edema  Pulmonary/Chest: Effort normal and breath sounds normal. He has no wheezes. He has no rales. He exhibits no tenderness.  Clear anteriorly and posteriorly and no axillary adenopathy  Abdominal: Soft. Bowel sounds are normal. He exhibits no mass. There is no  tenderness. There is no guarding.  Abdominal obesity without masses tenderness organ enlargement or bruits.  Musculoskeletal: Normal range of motion. He exhibits no edema.  Lymphadenopathy:    He has no cervical adenopathy.  Neurological: He is alert and oriented to person, place, and time. He has normal reflexes. No cranial nerve deficit.  Flexes are 2+ and equal bilaterally  Skin: Skin is warm and dry. No rash noted.  Nail fungus on several of the toes of each foot  Psychiatric: He has a normal mood and affect. His behavior is normal. Judgment and thought content normal.  Patient's mood and affect are somewhat down due to the noticed by his wife that she is not going to live with him anymore.  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  Nursing note and vitals reviewed.  BP 121/64 (BP Location: Left Arm)  Pulse 71   Temp (!) 97.2 F (36.2 C) (Oral)   Ht 5\' 9"  (1.753 m)   Wt 232 lb (105.2 kg)   BMI 34.26 kg/m         Assessment & Plan:  1. Type 2 diabetes mellitus without complication, without long-term current use of insulin (HCC) -10 you to monitor blood sugars closely and make every effort to lose weight through diet and exercise.  Hemoglobin A1c today was improved from 6.7 down to 6.1%. - Bayer DCA Hb A1c Waived - Microalbumin / creatinine urine ratio  2. Pure hypercholesterolemia -Continue with current treatment and work aggressively on therapeutic lifestyle changes.  Hopefully, the insurance will pay for Vascepa at 2 g twice daily and he should discontinue the over-the-counter omega-3 fatty acids.  3. Gastroesophageal reflux disease, esophagitis presence not specified -Continue Nexium  4. Vitamin D deficiency -Continue with vitamin D replacement  5. Benign prostatic hyperplasia, unspecified whether lower urinary tract symptoms present -No complaints today with  voiding  6. Encounter for immunization - Flu vaccine HIGH DOSE PF  7. Morbid obesity ([place]) -The patient's biggest problem is his weight and unfortunately he has lost 16 pounds of weight because of stress with his marriage recently.  He should continue this weight loss but in a more healthy way.  This will be good for his diabetes his overall health and his cholesterol.  Meds ordered this encounter  Medications  . Icosapent Ethyl (VASCEPA) 1 g CAPS    Sig: Take 2 capsules (2 g total) by mouth 2 (two) times daily.    Dispense:  120 capsule    Refill:  3   Patient Instructions                       Medicare Annual Wellness Visit  [facility] and the medical providers at [facility] strive to bring you the best medical care.  In doing so we not only want to address your current medical conditions and concerns but also to detect new conditions early and prevent illness, disease and health-related problems.    Medicare offers a yearly Wellness Visit which allows our clinical staff to assess your need for preventative services including immunizations, lifestyle education, counseling to decrease risk of preventable diseases and screening for fall risk and other medical concerns.    This visit is provided free of charge (no copay) for all Medicare recipients. The clinical pharmacists at [facility] have begun to conduct these Wellness Visits which will also include a thorough review of all your medications.    As you primary medical provider recommend that you make an appointment for your Annual Wellness Visit if you have not done so already this year.  You may set up this appointment before you leave today or you may call back ([phone]) and schedule an appointment.  Please make sure when you call that you mention that you are scheduling your Annual Wellness Visit with the clinical pharmacist so that the appointment may be made for the proper length of  time.     Continue current medications. Continue good therapeutic lifestyle changes which include good diet and exercise. Fall precautions discussed with patient. If an FOBT was given today- please return it to our front desk. If you are over 58 years old - you may need Prevnar 23 or the adult Pneumonia vaccine.  **Flu shots are available--- please call and schedule a FLU-CLINIC appointment**  After your  visit with [place] today you will receive a survey in the mail or online from [employer] regarding your care with [place]. Please take a moment to fill this out. Your feedback is very important to [place] as you can help [place] better understand your patient needs as well as improve your experience and satisfaction. WE CARE ABOUT YOU!!!   The patient should continue with his current treatment regimen for diabetes He should check his blood sugars regularly before meals and at bedtime He should schedule himself for an eye exam and make sure that we get a copy of that report We will check with his insurance regarding Vascepa at 2 g twice daily and he will stop the current omega-3 fatty acid that he is taking Based on his current history he does not need a baby aspirin because of the new recommendations from the American Heart Association He will return the FOBT and we will give him a flu shot.   Arrie Senate MD

## 2017-12-17 NOTE — Patient Instructions (Addendum)
Medicare Annual Wellness Visit  [facility] and the medical providers at [facility] strive to bring you the best medical care.  In doing so we not only want to address your current medical conditions and concerns but also to detect new conditions early and prevent illness, disease and health-related problems.    Medicare offers a yearly Wellness Visit which allows our clinical staff to assess your need for preventative services including immunizations, lifestyle education, counseling to decrease risk of preventable diseases and screening for fall risk and other medical concerns.    This visit is provided free of charge (no copay) for all Medicare recipients. The clinical pharmacists at [facility] have begun to conduct these Wellness Visits which will also include a thorough review of all your medications.    As you primary medical provider recommend that you make an appointment for your Annual Wellness Visit if you have not done so already this year.  You may set up this appointment before you leave today or you may call back ([phone]) and schedule an appointment.  Please make sure when you call that you mention that you are scheduling your Annual Wellness Visit with the clinical pharmacist so that the appointment may be made for the proper length of time.     Continue current medications. Continue good therapeutic lifestyle changes which include good diet and exercise. Fall precautions discussed with patient. If an FOBT was given today- please return it to our front desk. If you are over 57 years old - you may need Prevnar 10 or the adult Pneumonia vaccine.  **Flu shots are available--- please call and schedule a FLU-CLINIC appointment**  After your visit with [place] today you will receive a survey in the mail or online from [employer] regarding your care with [place]. Please take a moment to fill this out. Your feedback is very  important to [place] as you can help [place] better understand your patient needs as well as improve your experience and satisfaction. WE CARE ABOUT YOU!!!   The patient should continue with his current treatment regimen for diabetes He should check his blood sugars regularly before meals and at bedtime He should schedule himself for an eye exam and make sure that we get a copy of that report We will check with his insurance regarding Vascepa at 2 g twice daily and he will stop the current omega-3 fatty acid that he is taking Based on his current history he does not need a baby aspirin because of the new recommendations from the American Heart Association He will return the FOBT and we will give him a flu shot.

## 2017-12-18 LAB — MICROALBUMIN / CREATININE URINE RATIO
Creatinine, Urine: 61.1 mg/dL
Microalb/Creat Ratio: 13.3 mg/g creat (ref 0.0–30.0)
Microalbumin, Urine: 8.1 ug/mL

## 2017-12-19 ENCOUNTER — Other Ambulatory Visit: Payer: PPO

## 2017-12-19 ENCOUNTER — Other Ambulatory Visit: Payer: Self-pay | Admitting: *Deleted

## 2017-12-19 DIAGNOSIS — Z1212 Encounter for screening for malignant neoplasm of rectum: Secondary | ICD-10-CM

## 2017-12-30 ENCOUNTER — Other Ambulatory Visit: Payer: PPO

## 2017-12-30 DIAGNOSIS — Z1211 Encounter for screening for malignant neoplasm of colon: Secondary | ICD-10-CM

## 2017-12-30 LAB — FECAL OCCULT BLOOD, IMMUNOCHEMICAL

## 2017-12-31 LAB — FECAL OCCULT BLOOD, IMMUNOCHEMICAL: FECAL OCCULT BLD: NEGATIVE

## 2018-02-10 ENCOUNTER — Encounter: Payer: Self-pay | Admitting: Family Medicine

## 2018-04-22 ENCOUNTER — Ambulatory Visit: Payer: PPO | Admitting: Family Medicine

## 2018-07-23 ENCOUNTER — Other Ambulatory Visit: Payer: Self-pay

## 2018-07-23 ENCOUNTER — Ambulatory Visit: Payer: PPO

## 2018-07-23 ENCOUNTER — Telehealth: Payer: Self-pay | Admitting: *Deleted

## 2018-07-23 NOTE — Telephone Encounter (Signed)
Called and spoke with patient. Patient states that he has moved to [place] and could no longer drive this far to see a Doctor since Dr. Laurance Flatten is retiring.  Patient called to set up an appt with Dr. Caryn Section at [facility] but this Doctor is no longer taking new patients.  Patient would like to know if Dr. Laurance Flatten has any recommendations closer to [facility].

## 2018-07-23 NOTE — Telephone Encounter (Signed)
Spoke with pt regarding providers in [place] area Pt given names of providers in this area

## 2018-07-23 NOTE — Telephone Encounter (Signed)
Please see if Paul Johnston has any recommendations of family physicians in that area and let her call [name] with those recommendations.  Please tell [name] that all the people my age have already retired!!!!!  Also, please tell him how much I have enjoyed taking care of him as a patient and that I am very thankful that he let me be his doctor and that he followed me from [facility] to [facility].

## 2018-07-27 ENCOUNTER — Encounter: Payer: Self-pay | Admitting: Family Medicine

## 2018-09-15 ENCOUNTER — Encounter: Payer: Self-pay | Admitting: Family Medicine

## 2018-09-15 ENCOUNTER — Other Ambulatory Visit: Payer: Self-pay | Admitting: Family Medicine

## 2018-09-15 ENCOUNTER — Other Ambulatory Visit: Payer: Self-pay

## 2018-09-15 ENCOUNTER — Ambulatory Visit (INDEPENDENT_AMBULATORY_CARE_PROVIDER_SITE_OTHER): Payer: PPO | Admitting: Family Medicine

## 2018-09-15 VITALS — BP 154/76 | HR 71 | Temp 98.4°F | Resp 16 | Ht 69.0 in | Wt 227.0 lb

## 2018-09-15 DIAGNOSIS — E1169 Type 2 diabetes mellitus with other specified complication: Secondary | ICD-10-CM | POA: Diagnosis not present

## 2018-09-15 DIAGNOSIS — Z0000 Encounter for general adult medical examination without abnormal findings: Secondary | ICD-10-CM

## 2018-09-15 DIAGNOSIS — K219 Gastro-esophageal reflux disease without esophagitis: Secondary | ICD-10-CM | POA: Diagnosis not present

## 2018-09-15 DIAGNOSIS — Z8669 Personal history of other diseases of the nervous system and sense organs: Secondary | ICD-10-CM

## 2018-09-15 DIAGNOSIS — E559 Vitamin D deficiency, unspecified: Secondary | ICD-10-CM

## 2018-09-15 DIAGNOSIS — N400 Enlarged prostate without lower urinary tract symptoms: Secondary | ICD-10-CM

## 2018-09-15 DIAGNOSIS — E669 Obesity, unspecified: Secondary | ICD-10-CM | POA: Diagnosis not present

## 2018-09-15 DIAGNOSIS — Z7689 Persons encountering health services in other specified circumstances: Secondary | ICD-10-CM | POA: Diagnosis not present

## 2018-09-15 DIAGNOSIS — C4490 Unspecified malignant neoplasm of skin, unspecified: Secondary | ICD-10-CM | POA: Insufficient documentation

## 2018-09-15 DIAGNOSIS — E785 Hyperlipidemia, unspecified: Secondary | ICD-10-CM

## 2018-09-15 MED ORDER — ESOMEPRAZOLE MAGNESIUM 40 MG PO CPDR: 40.0000 mg | DELAYED_RELEASE_CAPSULE | Freq: Every day | ORAL | 3 refills | Status: DC

## 2018-09-15 MED ORDER — ROSUVASTATIN CALCIUM 20 MG PO TABS: 20.0000 mg | ORAL_TABLET | Freq: Every day | ORAL | 3 refills | Status: DC

## 2018-09-15 MED ORDER — METFORMIN HCL 500 MG PO TABS: 500.0000 mg | ORAL_TABLET | Freq: Two times a day (BID) | ORAL | 3 refills | Status: DC

## 2018-09-15 NOTE — Patient Instructions (Addendum)
Thank you for coming to the office today.   [facility]   [name], [name] [address] Hours: 8AM-5PM Phone: [phone]  Sarina Ser, MD Brendolyn Patty, MD   We will do a doctors physical in 3-4 weeks, (1 week after blood) also same day can get an Annual Medicare Wellness from [facility] here at the office.  Your provider would like to you have your annual eye exam. Please contact your current eye doctor or here are some good options for you to contact.   [facility]   Address: [address] [place], [place] [address] Phone: [phone]  Website: [email or web address] [address] [place], [place] [address] Phone: [phone] [email or web address]  [facility]  Address: [place], [place], [place] [address] Phone: [phone]   [facility] [address] [place], [place] [address] Phone: [phone]  [facility] Address: [place], [place], [place] [address]  Phone: [phone]    DUE for [place] (no food or drink after midnight before the lab appointment, only water or coffee without cream/sugar on the morning of)  SCHEDULE "Lab Only" visit in the morning at the clinic for lab draw in 2-3 WEEKS   - Make sure Lab Only appointment is at about 1 week before your next appointment, so that results will be available  For Lab Results, once available within 2-3 days of blood draw, you can can log in to MyChart online to view your results and a brief explanation. Also, we can discuss results at next follow-up visit.   Please schedule a Follow-up Appointment to: Return in about 4 weeks (around 10/13/2018) for Annual Physical.  If you have any other questions or concerns, please feel free to call the office or send a message through [name]. You may also schedule an earlier appointment if necessary.  Additionally, you may be receiving a survey about your experience at our office  within a few days to 1 week by e-mail or mail. We value your feedback.  [name], DO [name]

## 2018-09-15 NOTE — Progress Notes (Signed)
Subjective:    Patient ID: Paul Johnston, male    DOB: 02-24-1950, 68 y.o.   MRN: [id number]  Paul Johnston is a 68 y.o. male presenting on 09/15/2018 for Establish Care (facial spot) and Diarrhea (dizzy, nausea onset month which got improved but recent last week diarrhea and dizziness but denies nausea )  Establishing from previous PCP Dr Laurance Flatten, now he moved to area.  HPI    CHRONIC DM, Type 2: Reports last A1c in 6 range. no concerns CBGs: Infrequent checks Meds: Metformin 500mg  daily Reports good compliance. Tolerating well w/o side-effects Currently not on ACEi/ARB Lifestyle: - Diet (improving low carb DM diet)  Denies hypoglycemia, polyuria, visual changes, numbness or tingling.  Elevated BP  GERD Admits some occasional PM symptoms. History of gastric ulcer bleeding. On nexium long term. Previous GI Dr Benson [place] EGD. Cotton seed oil can cause  History of OSA Mild to moderate No CPAP, due to cost.  HYPERLIPIDEMIA: - Reports no concerns. Last lipid panel was normal in 2019 - Currently taking Rosuvastatin 20mg  daily (previously half tab of 40mg ), tolerating well without side effects or myalgias  History of Compressed Disc L3 Low Back Pain Previous history of back injury herniated disc / compression. He was seen by orthopedic back in 2015, on occasional hydrocodone PRN for back pain at that time, he says he kept left over rx, rarely uses it, but asks about how to treat back pain in future if flare up.  BPH DRE from previous PCP Dr Laurance Flatten, was normal except mild enlarged prostate. He has some mild urinary symptoms. No major concerns. Has had normal or negative PSA.   History of Vitamin D Deficiency Improved on Vitamin D supplement daily. Last checked in 2019.  Additional complaints / updates  Skin spot R side face - history of non melanoma skin cancer. He is asking about dermatologist in future. Had spot where excision after cryotherapy done L Cheek. This spot on R temple see  exam.  Dizziness - resolved Reports he had some episodes of dizziness spells about 1 month ago, also had some associated nausea and dry heaving, worse episodes with movement. - he also had an episode of diarrhea recently, now resolved   Depression screen [facility] 2/9 09/15/2018 12/17/2017 06/11/2017  Decreased Interest 0 1 0  Down, Depressed, Hopeless 0 3 0  PHQ - 2 Score 0 4 0  Altered sleeping - 1 -  Tired, decreased energy - 1 -  Change in appetite - 1 -  Feeling bad or failure about yourself  - 1 -  Trouble concentrating - 0 -  Moving slowly or fidgety/restless - 0 -  Suicidal thoughts - 0 -  PHQ-9 Score - 8 -  Difficult doing work/chores - Somewhat difficult -    Past Medical History:  Diagnosis Date  . Allergy   . Arthritis   . Diabetes mellitus without complication ([place])   . GERD (gastroesophageal reflux disease)   . Hyperlipidemia   . Ruptured intervertebral disc   . Ulcer    Past Surgical History:  Procedure Laterality Date  . APPENDECTOMY    . COLONOSCOPY    . INSERTION OF MESH N/A 07/16/2012   Procedure: INSERTION OF MESH;  Surgeon: Harl Bowie, MD;  Location: [place];  Service: General;  Laterality: N/A;  . ORBITAL FRACTURE SURGERY    . UMBILICAL HERNIA REPAIR N/A 07/16/2012   Procedure: HERNIA REPAIR UMBILICAL WITH MESH;  Surgeon: Harl Bowie, MD;  Location:  [place];  Service: General;  Laterality: N/A;  . UPPER GI ENDOSCOPY     Social History   Socioeconomic History  . Marital status: Married    Spouse name: Not on file  . Number of children: 2  . Years of education: Not on file  . Highest education level: Not on file  Occupational History    Employer: [employer]  . Financial resource strain: Not on file  . Food insecurity    Worry: Not on file    Inability: Not on file  . Transportation needs    Medical: Not on file    Non-medical: Not on file  Tobacco Use  . Smoking status: Former  Smoker    Packs/day: 1.00    Years: 33.00    Pack years: 33.00    Types: Cigarettes    Quit date: 09/06/2008    Years since quitting: 10.0  . Smokeless tobacco: Former Network engineer and Sexual Activity  . Alcohol use: No  . Drug use: No  . Sexual activity: Not on file  Lifestyle  . Physical activity    Days per week: Not on file    Minutes per session: Not on file  . Stress: Not on file  Relationships  . Social Herbalist on phone: Not on file    Gets together: Not on file    Attends religious service: Not on file    Active member of club or organization: Not on file    Attends meetings of clubs or organizations: Not on file    Relationship status: Not on file  . Intimate partner violence    Fear of current or ex partner: Not on file    Emotionally abused: Not on file    Physically abused: Not on file    Forced sexual activity: Not on file  Other Topics Concern  . Not on file  Social History Narrative   Two natural children and three step.  Lives with wife.    Family History  Problem Relation Age of Onset  . Emphysema Father        died age 43  . Heart failure Father   . Cancer Mother   . Heart disease Paternal Grandfather    Current Outpatient Medications on File Prior to Visit  Medication Sig  . Cholecalciferol (VITAMIN D3) 5000 units CAPS Take by mouth. 1 a day  . glucose blood test strip Use as instructed  . Lancets (ONETOUCH ULTRASOFT) lancets Use as instructed   No current facility-administered medications on file prior to visit.     Review of Systems Per HPI unless specifically indicated above      Objective:    BP (!) 154/76   Pulse 71   Temp 98.4 F (36.9 C) (Oral)   Resp 16   Ht 5\' 9"  (1.753 m)   Wt 227 lb (103 kg)   BMI 33.52 kg/m   Wt Readings from Last 3 Encounters:  09/15/18 227 lb (103 kg)  12/17/17 232 lb (105.2 kg)  06/11/17 248 lb (112.5 kg)    Physical Exam Vitals signs and nursing note reviewed.  Constitutional:       General: He is not in acute distress.    Appearance: He is well-developed. He is not diaphoretic.     Comments: Well-appearing, comfortable, cooperative, obese  HENT:     Head: Normocephalic and atraumatic.  Eyes:     General:  Right eye: No discharge.        Left eye: No discharge.     Conjunctiva/sclera: Conjunctivae normal.  Cardiovascular:     Rate and Rhythm: Normal rate.  Pulmonary:     Effort: Pulmonary effort is normal.  Skin:    General: Skin is warm and dry.     Findings: No erythema or rash.     Comments: R face temple region with slightly raised pale pigmented seborrheic keratosis with surrounding hypopigmentation ring, appears as if it was frozen in past by cryo, now spot has returned. No ulceration, non tender, no actual pigmented mole appearance. Several SKs on face and arms. Area of prior cryo and surgical excision L cheek  Neurological:     Mental Status: He is alert and oriented to person, place, and time.  Psychiatric:        Behavior: Behavior normal.     Comments: Well groomed, good eye contact, normal speech and thoughts        Results for orders placed or performed in visit on 09/15/18  Hemoglobin A1c  Result Value Ref Range   Hemoglobin A1C 6.1       Assessment & Plan:   Problem List Items Addressed This Visit    GERD (gastroesophageal reflux disease) Controlled on PPI Refill Esomeprazole 40mg  daily (he prefers evening dosing) May take additional antacid PRN if need - used to take Zantac, now takes some prevacid    Relevant Medications   esomeprazole (NEXIUM) 40 MG capsule   History of sleep apnea Not on CPAP Doing well without significant clinical symptoms at this time, was dx with mild problem in past.    Hyperlipidemia associated with type 2 diabetes mellitus ([place]) Controlled Due for lipids within 4 weeks Re order Rosuvastatin 20mg  daily, instead of half of 40mg     Relevant Medications   metFORMIN (GLUCOPHAGE) 500 MG tablet    rosuvastatin (CRESTOR) 20 MG tablet   Non-melanoma skin cancer Handout with Dermat[facility]) info for future if interested Has several SKs on face and arms. May warrant skin check Prior excisional biopsy from non melanoma cancer L cheek    Obesity (BMI 30.0-34.9)   Type 2 diabetes mellitus with other specified complication (HCC) - Primary Previous A1c controlled 6 range Limited CBGs Re order Metformin 500mg  daily, he has taken 500 BID in past, request order for BID Follow up as planned    Relevant Medications   metFORMIN (GLUCOPHAGE) 500 MG tablet   rosuvastatin (CRESTOR) 20 MG tablet   Vitamin D deficiency    Other Visit Diagnoses    Encounter to establish care with new doctor        Review chart, previous PCP.    Meds ordered this encounter  Medications  . metFORMIN (GLUCOPHAGE) 500 MG tablet    Sig: Take 1 tablet (500 mg total) by mouth 2 (two) times daily with a meal.    Dispense:  180 tablet    Refill:  3  . rosuvastatin (CRESTOR) 20 MG tablet    Sig: Take 1 tablet (20 mg total) by mouth daily.    Dispense:  90 tablet    Refill:  3  . esomeprazole (NEXIUM) 40 MG capsule    Sig: Take 1 capsule (40 mg total) by mouth daily.    Dispense:  90 capsule    Refill:  3    Follow up plan: Return in about 4 weeks (around 10/13/2018) for Annual Physical.   Future fasting  labs - CMET, CBC, Lipid, A1c, Vitamin D, PSA, TSH - 10/16/18  + Annual Medicare Wellness on same day.  [name], [facility] 09/15/2018, 3:33 PM

## 2018-10-15 ENCOUNTER — Other Ambulatory Visit: Payer: Self-pay | Admitting: Family Medicine

## 2018-10-15 ENCOUNTER — Other Ambulatory Visit: Payer: PPO

## 2018-10-15 DIAGNOSIS — E1169 Type 2 diabetes mellitus with other specified complication: Secondary | ICD-10-CM

## 2018-10-15 DIAGNOSIS — Z0000 Encounter for general adult medical examination without abnormal findings: Secondary | ICD-10-CM

## 2018-10-15 DIAGNOSIS — E785 Hyperlipidemia, unspecified: Secondary | ICD-10-CM

## 2018-10-15 DIAGNOSIS — E559 Vitamin D deficiency, unspecified: Secondary | ICD-10-CM

## 2018-10-15 DIAGNOSIS — N400 Enlarged prostate without lower urinary tract symptoms: Secondary | ICD-10-CM | POA: Diagnosis not present

## 2018-10-16 ENCOUNTER — Other Ambulatory Visit: Payer: PPO

## 2018-10-16 LAB — CBC WITH DIFFERENTIAL/PLATELET
Absolute Monocytes: 810 cells/uL (ref 200–950)
Basophils Absolute: 73 cells/uL (ref 0–200)
Basophils Relative: 0.8 %
Eosinophils Absolute: 319 cells/uL (ref 15–500)
Eosinophils Relative: 3.5 %
HCT: 46.6 % (ref 38.5–50.0)
Hemoglobin: 14.7 g/dL (ref 13.2–17.1)
Lymphs Abs: 2575 cells/uL (ref 850–3900)
MCH: 28.3 pg (ref 27.0–33.0)
MCHC: 31.5 g/dL — ABNORMAL LOW (ref 32.0–36.0)
MCV: 89.6 fL (ref 80.0–100.0)
MPV: 10.8 fL (ref 7.5–12.5)
Monocytes Relative: 8.9 %
Neutro Abs: 5324 cells/uL (ref 1500–7800)
Neutrophils Relative %: 58.5 %
Platelets: 181 10*3/uL (ref 140–400)
RBC: 5.2 10*6/uL (ref 4.20–5.80)
RDW: 13.1 % (ref 11.0–15.0)
Total Lymphocyte: 28.3 %
WBC: 9.1 10*3/uL (ref 3.8–10.8)

## 2018-10-16 LAB — COMPLETE METABOLIC PANEL WITH GFR
AG Ratio: 1.6 (calc) (ref 1.0–2.5)
ALT: 13 U/L (ref 9–46)
AST: 18 U/L (ref 10–35)
Albumin: 4.2 g/dL (ref 3.6–5.1)
Alkaline phosphatase (APISO): 30 U/L — ABNORMAL LOW (ref 35–144)
BUN: 17 mg/dL (ref 7–25)
CO2: 30 mmol/L (ref 20–32)
Calcium: 9.9 mg/dL (ref 8.6–10.3)
Chloride: 102 mmol/L (ref 98–110)
Creat: 1.03 mg/dL (ref 0.70–1.25)
GFR, Est African American: 87 mL/min/{1.73_m2} (ref >=60)
GFR, Est Non African American: 75 mL/min/{1.73_m2} (ref >=60)
Globulin: 2.6 g/dL (calc) (ref 1.9–3.7)
Glucose, Bld: 108 mg/dL — ABNORMAL HIGH (ref 65–99)
Potassium: 5 mmol/L (ref 3.5–5.3)
Sodium: 142 mmol/L (ref 135–146)
Total Bilirubin: 0.6 mg/dL (ref 0.2–1.2)
Total Protein: 6.8 g/dL (ref 6.1–8.1)

## 2018-10-16 LAB — HEMOGLOBIN A1C
Hgb A1c MFr Bld: 6.4 % of total Hgb — ABNORMAL HIGH (ref <5.7)
Mean Plasma Glucose: 137 (calc)
eAG (mmol/L): 7.6 (calc)

## 2018-10-16 LAB — LIPID PANEL
Cholesterol: 110 mg/dL (ref <200)
HDL: 43 mg/dL (ref >=40)
LDL Cholesterol (Calc): 44 mg/dL (calc)
Non-HDL Cholesterol (Calc): 67 mg/dL (calc) (ref <130)
Total CHOL/HDL Ratio: 2.6 (calc) (ref <5.0)
Triglycerides: 143 mg/dL (ref <150)

## 2018-10-16 LAB — PSA: PSA: 0.5 ng/mL (ref <=4.0)

## 2018-10-16 LAB — VITAMIN D 25 HYDROXY (VIT D DEFICIENCY, FRACTURES): Vit D, 25-Hydroxy: 59 ng/mL (ref 30–100)

## 2018-10-16 LAB — TSH: TSH: 2.74 mIU/L (ref 0.40–4.50)

## 2018-10-19 ENCOUNTER — Encounter: Payer: Self-pay | Admitting: Family Medicine

## 2018-10-20 ENCOUNTER — Ambulatory Visit: Payer: PPO

## 2018-10-26 ENCOUNTER — Encounter: Payer: PPO | Admitting: Family Medicine

## 2018-10-27 ENCOUNTER — Encounter: Payer: Self-pay | Admitting: Family Medicine

## 2018-10-27 ENCOUNTER — Ambulatory Visit (INDEPENDENT_AMBULATORY_CARE_PROVIDER_SITE_OTHER): Payer: PPO | Admitting: Family Medicine

## 2018-10-27 ENCOUNTER — Encounter: Payer: PPO | Admitting: Family Medicine

## 2018-10-27 ENCOUNTER — Other Ambulatory Visit: Payer: Self-pay

## 2018-10-27 ENCOUNTER — Ambulatory Visit (INDEPENDENT_AMBULATORY_CARE_PROVIDER_SITE_OTHER): Payer: PPO

## 2018-10-27 VITALS — BP 144/73 | HR 79 | Temp 98.7°F | Resp 16 | Ht 69.0 in | Wt 233.0 lb

## 2018-10-27 VITALS — BP 144/73 | HR 79 | Temp 98.7°F | Resp 16 | Ht 69.0 in | Wt 233.5 lb

## 2018-10-27 DIAGNOSIS — E785 Hyperlipidemia, unspecified: Secondary | ICD-10-CM | POA: Diagnosis not present

## 2018-10-27 DIAGNOSIS — Z0000 Encounter for general adult medical examination without abnormal findings: Secondary | ICD-10-CM

## 2018-10-27 DIAGNOSIS — E1169 Type 2 diabetes mellitus with other specified complication: Secondary | ICD-10-CM | POA: Diagnosis not present

## 2018-10-27 DIAGNOSIS — R030 Elevated blood-pressure reading, without diagnosis of hypertension: Secondary | ICD-10-CM | POA: Diagnosis not present

## 2018-10-27 LAB — POCT UA - MICROALBUMIN: Microalbumin Ur, POC: 0 mg/L

## 2018-10-27 NOTE — Progress Notes (Signed)
Subjective:    Patient ID: Paul Johnston, male    DOB: 1950/02/27, 68 y.o.   MRN: [id number]  Paul Johnston is a 67 y.o. male presenting on 10/27/2018 for Annual Exam  Here for Annual Physical and Lab Review.   He has completed AMW with [facility] LPN today.  HPI  CHRONIC DM, Type 2: Last A1c on lab up to 6.4, mild elevation CBGs: Infrequent checks Meds: Metformin 500mg  once daily Reports good compliance. Tolerating well w/o side-effects Currently not on ACEi/ARB Lifestyle: - Diet (improving low carb DM diet)  - Exercise: limited - Previous Eye Doctor - Dr Ellin Mayhew in [place], he used to go there >15 years ago, now he plans to restart. Denies hypoglycemia, polyuria, visual changes, numbness or tingling.  Elevated BP Reports not checking BP at home. No prior problems. Current Meds - None, never on   Denies CP, dyspnea, HA, edema, dizziness / lightheadedness  HYPERLIPIDEMIA: - Reports no concerns. Last lipid panel was normal in 2019 - Currently taking Rosuvastatin 20mg  daily (previously half tab of 40mg ), tolerating well without side effects or myalgias  BPH DRE from previous PCP Dr Laurance Flatten, was normal except mild enlarged prostate. He has some mild urinary symptoms. Last PSA 0.5 negative.   Health Maintenance:  Due for Flu Shot, defers will get in October 2020  UTD on Pneumonia Vaccine, prevnar-13 (2014)    Depression screen [facility] 2/9 10/27/2018 09/15/2018 12/17/2017  Decreased Interest 0 0 1  Down, Depressed, Hopeless 0 0 3  PHQ - 2 Score 0 0 4  Altered sleeping - - 1  Tired, decreased energy - - 1  Change in appetite - - 1  Feeling bad or failure about yourself  - - 1  Trouble concentrating - - 0  Moving slowly or fidgety/restless - - 0  Suicidal thoughts - - 0  PHQ-9 Score - - 8  Difficult doing work/chores - - Somewhat difficult    Past Medical History:  Diagnosis Date  . Allergy   . Arthritis   . GERD (gastroesophageal reflux disease)   . Hyperlipidemia   .  Non-melanoma skin cancer   . Ruptured intervertebral disc   . Sleep apnea    Was diagnosed with moderate to light sleep apnea  . Ulcer    Past Surgical History:  Procedure Laterality Date  . APPENDECTOMY    . COLONOSCOPY    . FRACTURE SURGERY  1980's   Lower 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  Marland Kitchen HERNIA REPAIR  2014?   Umbilical hernia. Mesh installed.  . INSERTION OF MESH N/A 07/16/2012   Procedure: INSERTION OF MESH;  Surgeon: Harl Bowie, MD;  Location: [place];  Service: General;  Laterality: N/A;  . ORBITAL FRACTURE SURGERY    . UMBILICAL HERNIA REPAIR N/A 07/16/2012   Procedure: HERNIA REPAIR UMBILICAL WITH MESH;  Surgeon: Harl Bowie, MD;  Location: [place];  Service: General;  Laterality: N/A;  . UPPER GI ENDOSCOPY     Social History   Socioeconomic History  . Marital status: Married    Spouse name: Not on file  . Number of children: 2  . Years of education: Not on file  . Highest education level: Not on file  Occupational History    Employer: [employer]  . Financial resource strain: Not on file  . Food insecurity    Worry: Not on file    Inability: Not on file  . Transportation  needs    Medical: Not on file    Non-medical: Not on file  Tobacco Use  . Smoking status: Former Smoker    Packs/day: 1.00    Years: 33.00    Pack years: 33.00    Types: Cigarettes    Quit date: 09/06/2008    Years since quitting: 10.1  . Smokeless tobacco: Former Network engineer and Sexual Activity  . Alcohol use: No  . Drug use: No  . Sexual activity: Not on file  Lifestyle  . Physical activity    Days per week: 0 days    Minutes per session: 0 min  . Stress: Not at all  Relationships  . Social Herbalist on phone: Not on file    Gets together: Not on file    Attends religious service: Not on file    Active member of club or organization: Not on file    Attends meetings of clubs or organizations:  Not on file    Relationship status: Married  . Intimate partner violence    Fear of current or ex partner: No    Emotionally abused: No    Physically abused: No    Forced sexual activity: No  Other Topics Concern  . Not on file  Social History Narrative   Two natural children and three step.  Lives with wife.    Family History  Problem Relation Age of Onset  . Emphysema Father        died age 37  . Heart failure Father   . COPD Father   . Cancer Mother   . Heart disease Paternal Grandfather   . COPD Paternal Grandfather    Current Outpatient Medications on File Prior to Visit  Medication Sig  . Cholecalciferol (VITAMIN D3) 5000 units CAPS Take by mouth. 1 a day  . esomeprazole (NEXIUM) 40 MG capsule Take 1 capsule (40 mg total) by mouth daily.  Marland Kitchen glucose blood test strip Use as instructed  . Lancets (ONETOUCH ULTRASOFT) lancets Use as instructed  . metFORMIN (GLUCOPHAGE) 500 MG tablet Take 1 tablet (500 mg total) by mouth 2 (two) times daily with a meal.  . rosuvastatin (CRESTOR) 20 MG tablet Take 1 tablet (20 mg total) by mouth daily.   No current facility-administered medications on file prior to visit.     Review of Systems  Constitutional: Negative for activity change, appetite change, chills, diaphoresis, fatigue and fever.  HENT: Negative for congestion and hearing loss.   Eyes: Negative for visual disturbance.  Respiratory: Negative for cough, chest tightness, shortness of breath and wheezing.   Cardiovascular: Negative for chest pain, palpitations and leg swelling.  Gastrointestinal: Negative for abdominal pain, constipation, diarrhea, nausea and vomiting.  Genitourinary: Negative for dysuria, frequency and hematuria.  Musculoskeletal: Negative for arthralgias and neck pain.  Skin: Negative for rash.  Neurological: Negative for dizziness, weakness, light-headedness, numbness and headaches.  Hematological: Negative for adenopathy.  Psychiatric/Behavioral: Negative  for behavioral problems, dysphoric mood and sleep disturbance.   Per HPI unless specifically indicated above      Objective:    BP (!) 144/73   Pulse 79   Temp 98.7 F (37.1 C) (Oral)   Resp 16   Ht 5\' 9"  (1.753 m)   Wt 233 lb (105.7 kg)   SpO2 97%   BMI 34.41 kg/m   Wt Readings from Last 3 Encounters:  10/27/18 233 lb (105.7 kg)  10/27/18 233 lb 8 oz (105.9 kg)  09/15/18  227 lb (103 kg)    Physical Exam Vitals signs and nursing note reviewed.  Constitutional:      General: He is not in acute distress.    Appearance: He is well-developed. He is not diaphoretic.     Comments: Well-appearing, comfortable, cooperative  HENT:     Head: Normocephalic and atraumatic.  Eyes:     General:        Right eye: No discharge.        Left eye: No discharge.     Conjunctiva/sclera: Conjunctivae normal.     Pupils: Pupils are equal, round, and reactive to light.  Neck:     Musculoskeletal: Normal range of motion and neck supple.     Thyroid: No thyromegaly.     Comments: No carotid bruit Cardiovascular:     Rate and Rhythm: Normal rate and regular rhythm.     Heart sounds: Normal heart sounds. No murmur.  Pulmonary:     Effort: Pulmonary effort is normal. No respiratory distress.     Breath sounds: Normal breath sounds. No wheezing or rales.  Abdominal:     General: Bowel sounds are normal. There is no distension.     Palpations: Abdomen is soft. There is no mass.     Tenderness: There is no abdominal tenderness.  Musculoskeletal: Normal range of motion.        General: No tenderness.     Comments: Upper / Lower Extremities: - Normal muscle tone, strength bilateral upper extremities 5/5, lower extremities 5/5  Lymphadenopathy:     Cervical: No cervical adenopathy.  Skin:    General: Skin is warm and dry.     Findings: No erythema or rash.  Neurological:     Mental Status: He is alert and oriented to person, place, and time.     Comments: Distal sensation intact to light  touch all extremities  Psychiatric:        Behavior: Behavior normal.     Comments: Well groomed, good eye contact, normal speech and thoughts    Recent Labs    12/17/17 12/17/17 1612 10/15/18 1130  HGBA1C 6.1 6.1 6.4*    Lipid Panel     Component Value Date/Time   CHOL 110 10/15/2018 1130   CHOL 120 06/09/2015 1702   CHOL 102 11/18/2012 1543   TRIG 143 10/15/2018 1130   TRIG 83 11/18/2012 1543   HDL 43 10/15/2018 1130   HDL 40 06/09/2015 1702   HDL 41 11/18/2012 1543   CHOLHDL 2.6 10/15/2018 1130   VLDL 12 06/02/2013 1509   LDLCALC 44 10/15/2018 1130   LDLCALC 44 11/18/2012 1543     Chemistry      Component Value Date/Time   NA 142 10/15/2018 1130   NA 139 06/11/2017 1625   K 5.0 10/15/2018 1130   CL 102 10/15/2018 1130   CO2 30 10/15/2018 1130   BUN 17 10/15/2018 1130   BUN 18 06/11/2017 1625   CREATININE 1.03 10/15/2018 1130      Component Value Date/Time   CALCIUM 9.9 10/15/2018 1130   ALKPHOS 45 06/11/2017 1625   AST 18 10/15/2018 1130   ALT 13 10/15/2018 1130   BILITOT 0.6 10/15/2018 1130   BILITOT 0.5 06/11/2017 1625        Results for orders placed or performed in visit on 10/27/18  POCT UA - Microalbumin  Result Value Ref Range   Microalbumin Ur, POC 0 mg/L      Assessment & Plan:  Problem List Items Addressed This Visit    Hyperlipidemia associated with type 2 diabetes mellitus ([place])    Mostly Controlled cholesterol on statin lifestyle Last lipid panel 10/2018  Plan: 1. Continue current meds - Rosuvastatin 20mg  daily 2. Not on ASA 3. Encourage improved lifestyle - low carb/cholesterol, reduce portion size, continue improving regular exercise      Type 2 diabetes mellitus with other specified complication (HCC)    Well-controlled DM with A1c 6.4 previous 6.1 No known complications or hypoglycemia. Complications - hyperlipidemia, obesity, GERD - increases risk of future cardiovascular complications   Plan:  1. Continue current  therapy - Metformin 500mg  daily IR - Future consider new DM medications if indicated 2. Encourage improved lifestyle - low carb, low sugar diet, reduce portion size, continue improving regular exercise 3. Check CBG , bring log to next visit for review 4. Continue Statin - Check urine microalbumin = 0 - defer ACEi ARB for now 5. UTD DM Foot. Advised to schedule DM ophtho exam, send record - [place] 6. Follow-up 3 months       Relevant Orders   POCT UA - Microalbumin (Completed)    Other Visit Diagnoses    Annual physical exam    -  Primary   Elevated BP without diagnosis of hypertension        Mildly elevated BP Check home readings, work with Tyler Aas LPN C3 to arrange home BP monitor Follow-up may consider low dose ACEi ARB   Updated Health Maintenance information - defers flu shot until October 2020 due to timing of vaccine and duration - PSA negative - Not yet due for repeat colon CA Reviewed recent lab results with patient Encouraged improvement to lifestyle with diet and exercise - Goal of weight loss    No orders of the defined types were placed in this encounter.   Follow up plan: Return in about 3 months (around 01/26/2019) for 3 month for DM A1c, BP follow-up.  [name], [facility] 10/27/2018, 3:05 PM

## 2018-10-27 NOTE — Patient Instructions (Addendum)
Thank you for coming to the office today.  Please schedule and return for a NURSE ONLY VISIT for VACCINE - Approximately around mid October 2020 - Need High Dose Flu Vaccine  You are good for pneumonia vaccines. No more needed now.  schedule with Dr Ellin Mayhew for a Diabetic Eye exam, send [place] a copy of that report  [facility]   Address: [address], [place] [address] Phone: [phone]  Website: [email or web address]  We will look into options through [place] on getting a blood pressure cuff. If you want you can call your insurance as well to check into this.   Please schedule a Follow-up Appointment to: Return in about 3 months (around 01/26/2019) for 3 month for DM A1c, BP follow-up.  If you have any other questions or concerns, please feel free to call the office or send a message through [place]. You may also schedule an earlier appointment if necessary.  Additionally, you may be receiving a survey about your experience at our office within a few days to 1 week by e-mail or mail. We value your feedback.  [name], DO [place]

## 2018-10-27 NOTE — Patient Instructions (Signed)
Paul Johnston , Thank you for taking time to come for your Medicare Wellness Visit. I appreciate your ongoing commitment to your health goals. Please review the following plan we discussed and let me know if I can assist you in the future.   Screening recommendations/referrals: Colonoscopy: completed 03/16/2015 Recommended yearly ophthalmology/optometry visit for glaucoma screening and checkup Recommended yearly dental visit for hygiene and checkup  Vaccinations: Influenza vaccine: due now  Pneumococcal vaccine: up to date Tdap vaccine: up to date Shingles vaccine: shingrix eligible     Advanced directives: Advance directive discussed with you today. Even though you declined this today please call our office should you change your mind and we can give you the proper paperwork for you to fill out.  Conditions/risks identified: diabetic- discussed chronic care management team.  Discussed lung cancer screening- information provided  Next appointment: Follow up in one year for your annual wellness visit   Preventive Care 65 Years and Older, Male Preventive care refers to lifestyle choices and visits with your health care provider that can promote health and wellness. What does preventive care include?  A yearly physical exam. This is also called an annual well check.  Dental exams once or twice a year.  Routine eye exams. Ask your health care provider how often you should have your eyes checked.  Personal lifestyle choices, including:  Daily care of your teeth and gums.  Regular physical activity.  Eating a healthy diet.  Avoiding tobacco and drug use.  Limiting alcohol use.  Practicing safe sex.  Taking low doses of aspirin every day.  Taking vitamin and mineral supplements as recommended by your health care provider. What happens during an annual well check? The services and screenings done by your health care provider during your annual well check will depend on your age,  overall health, lifestyle risk factors, and family history of disease. Counseling  Your health care provider may ask you questions about your:  Alcohol use.  Tobacco use.  Drug use.  Emotional well-being.  Home and relationship well-being.  Sexual activity.  Eating habits.  History of falls.  Memory and ability to understand (cognition).  Work and work Statistician. Screening  You may have the following tests or measurements:  Height, weight, and BMI.  Blood pressure.  Lipid and cholesterol levels. These may be checked every 5 years, or more frequently if you are over 46 years old.  Skin check.  Lung cancer screening. You may have this screening every year starting at age 78 if you have a 30-pack-year history of smoking and currently smoke or have quit within the past 15 years.  Fecal occult blood test (FOBT) of the stool. You may have this test every year starting at age 92.  Flexible sigmoidoscopy or colonoscopy. You may have a sigmoidoscopy every 5 years or a colonoscopy every 10 years starting at age 68.  Prostate cancer screening. Recommendations will vary depending on your family history and other risks.  Hepatitis C blood test.  Hepatitis B blood test.  Sexually transmitted disease (STD) testing.  Diabetes screening. This is done by checking your blood sugar (glucose) after you have not eaten for a while (fasting). You may have this done every 1-3 years.  Abdominal aortic aneurysm (AAA) screening. You may need this if you are a current or former smoker.  Osteoporosis. You may be screened starting at age 41 if you are at high risk. Talk with your health care provider about your test results, treatment options,  and if necessary, the need for more tests. Vaccines  Your health care provider may recommend certain vaccines, such as:  Influenza vaccine. This is recommended every year.  Tetanus, diphtheria, and acellular pertussis (Tdap, Td) vaccine. You may  need a Td booster every 10 years.  Zoster vaccine. You may need this after age 74.  Pneumococcal 13-valent conjugate (PCV13) vaccine. One dose is recommended after age 101.  Pneumococcal polysaccharide (PPSV23) vaccine. One dose is recommended after age 40. Talk to your health care provider about which screenings and vaccines you need and how often you need them. This information is not intended to replace advice given to you by your health care provider. Make sure you discuss any questions you have with your health care provider. Document Released: 02/24/2015 Document Revised: 10/18/2015 Document Reviewed: 11/29/2014 Elsevier Interactive Patient Education  2017 [place] Prevention in the Home Falls can cause injuries. They can happen to people of all ages. There are many things you can do to make your home safe and to help prevent falls. What can I do on the outside of my home?  Regularly fix the edges of walkways and driveways and fix any cracks.  Remove anything that might make you trip as you walk through a door, such as a raised step or threshold.  Trim any bushes or trees on the path to your home.  Use bright outdoor lighting.  Clear any walking paths of anything that might make someone trip, such as rocks or tools.  Regularly check to see if handrails are loose or broken. Make sure that both sides of any steps have handrails.  Any raised decks and porches should have guardrails on the edges.  Have any leaves, snow, or ice cleared regularly.  Use sand or salt on walking paths during winter.  Clean up any spills in your garage right away. This includes oil or grease spills. What can I do in the bathroom?  Use night lights.  Install grab bars by the toilet and in the tub and shower. Do not use towel bars as grab bars.  Use non-skid mats or decals in the tub or shower.  If you need to sit down in the shower, use a plastic, non-slip stool.  Keep the floor  dry. Clean up any water that spills on the floor as soon as it happens.  Remove soap buildup in the tub or shower regularly.  Attach bath mats securely with double-sided non-slip rug tape.  Do not have throw rugs and other things on the floor that can make you trip. What can I do in the bedroom?  Use night lights.  Make sure that you have a light by your bed that is easy to reach.  Do not use any sheets or blankets that are too big for your bed. They should not hang down onto the floor.  Have a firm chair that has side arms. You can use this for support while you get dressed.  Do not have throw rugs and other things on the floor that can make you trip. What can I do in the kitchen?  Clean up any spills right away.  Avoid walking on wet floors.  Keep items that you use a lot in easy-to-reach places.  If you need to reach something above you, use a strong step stool that has a grab bar.  Keep electrical cords out of the way.  Do not use floor polish or wax that makes floors slippery. If  you must use wax, use non-skid floor wax.  Do not have throw rugs and other things on the floor that can make you trip. What can I do with my stairs?  Do not leave any items on the stairs.  Make sure that there are handrails on both sides of the stairs and use them. Fix handrails that are broken or loose. Make sure that handrails are as long as the stairways.  Check any carpeting to make sure that it is firmly attached to the stairs. Fix any carpet that is loose or worn.  Avoid having throw rugs at the top or bottom of the stairs. If you do have throw rugs, attach them to the floor with carpet tape.  Make sure that you have a light switch at the top of the stairs and the bottom of the stairs. If you do not have them, ask someone to add them for you. What else can I do to help prevent falls?  Wear shoes that:  Do not have high heels.  Have rubber bottoms.  Are comfortable and fit you  well.  Are closed at the toe. Do not wear sandals.  If you use a stepladder:  Make sure that it is fully opened. Do not climb a closed stepladder.  Make sure that both sides of the stepladder are locked into place.  Ask someone to hold it for you, if possible.  Clearly mark and make sure that you can see:  Any grab bars or handrails.  First and last steps.  Where the edge of each step is.  Use tools that help you move around (mobility aids) if they are needed. These include:  Canes.  Walkers.  Scooters.  Crutches.  Turn on the lights when you go into a dark area. Replace any light bulbs as soon as they burn out.  Set up your furniture so you have a clear path. Avoid moving your furniture around.  If any of your floors are uneven, fix them.  If there are any pets around you, be aware of where they are.  Review your medicines with your doctor. Some medicines can make you feel dizzy. This can increase your chance of falling. Ask your doctor what other things that you can do to help prevent falls. This information is not intended to replace advice given to you by your health care provider. Make sure you discuss any questions you have with your health care provider. Document Released: 11/24/2008 Document Revised: 07/06/2015 Document Reviewed: 03/04/2014 Elsevier Interactive Patient Education  2017 [employer].

## 2018-10-27 NOTE — Progress Notes (Signed)
Subjective:   Paul Johnston is a 68 y.o. male who presents for an Initial Medicare Annual Wellness Visit.  Review of Systems   Cardiac Risk Factors include: advanced age (>29men, >38 women);dyslipidemia;hypertension;male gender;obesity (BMI >30kg/m2);smoking/ tobacco exposure    Objective:    Today's Vitals   10/27/18 1414  BP: (!) 144/73  Pulse: 79  Resp: 16  Temp: 98.7 F (37.1 C)  TempSrc: Oral  SpO2: 97%  Weight: 233 lb 8 oz (105.9 kg)  Height: 5\' 9"  (1.753 m)   Body mass index is 34.48 kg/m.  Advanced Directives 10/27/2018 07/13/2012  Does Patient Have a Medical Advance Directive? No Patient does not have advance directive  Would patient like information on creating a medical advance directive? No - Patient declined -    Current Medications (verified) Outpatient Encounter Medications as of 10/27/2018  Medication Sig  . Cholecalciferol (VITAMIN D3) 5000 units CAPS Take by mouth. 1 a day  . esomeprazole (NEXIUM) 40 MG capsule Take 1 capsule (40 mg total) by mouth daily.  Marland Kitchen glucose blood test strip Use as instructed  . Lancets (ONETOUCH ULTRASOFT) lancets Use as instructed  . metFORMIN (GLUCOPHAGE) 500 MG tablet Take 1 tablet (500 mg total) by mouth 2 (two) times daily with a meal.  . rosuvastatin (CRESTOR) 20 MG tablet Take 1 tablet (20 mg total) by mouth daily.   No facility-administered encounter medications on file as of 10/27/2018.     Allergies (verified) Codeine, Biaxin [clarithromycin], and Mucinex [guaifenesin er]   History: Past Medical History:  Diagnosis Date  . Allergy   . Arthritis   . Diabetes mellitus without complication ([facility]) 123456?   I guess this is type 2 diabetes?  . GERD (gastroesophageal reflux disease)   . Hyperlipidemia   . Non-melanoma skin cancer   . Ruptured intervertebral disc   . Sleep apnea    Was diagnosed with moderate to light sleep apnea  . Ulcer    Past Surgical History:  Procedure Laterality Date  . APPENDECTOMY    .  COLONOSCOPY    . FRACTURE SURGERY  1980's   Lower 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  Marland Kitchen HERNIA REPAIR  2014?   Umbilical hernia. Mesh installed.  . INSERTION OF MESH N/A 07/16/2012   Procedure: INSERTION OF MESH;  Surgeon: Harl Bowie, MD;  Location: [facility];  Service: General;  Laterality: N/A;  . ORBITAL FRACTURE SURGERY    . UMBILICAL HERNIA REPAIR N/A 07/16/2012   Procedure: HERNIA REPAIR UMBILICAL WITH MESH;  Surgeon: Harl Bowie, MD;  Location: [facility];  Service: General;  Laterality: N/A;  . UPPER GI ENDOSCOPY     Family History  Problem Relation Age of Onset  . Emphysema Father        died age 53  . Heart failure Father   . COPD Father   . Cancer Mother   . Heart disease Paternal Grandfather   . COPD Paternal Grandfather    Social History   Socioeconomic History  . Marital status: Married    Spouse name: Not on file  . Number of children: 2  . Years of education: Not on file  . Highest education level: Not on file  Occupational History    Employer: [employer]  . Financial resource strain: Not on file  . Food insecurity    Worry: Not on file    Inability: Not on file  . Transportation needs  Medical: Not on file    Non-medical: Not on file  Tobacco Use  . Smoking status: Former Smoker    Packs/day: 1.00    Years: 33.00    Pack years: 33.00    Types: Cigarettes    Quit date: 09/06/2008    Years since quitting: 10.1  . Smokeless tobacco: Former Network engineer and Sexual Activity  . Alcohol use: No  . Drug use: No  . Sexual activity: Not on file  Lifestyle  . Physical activity    Days per week: 0 days    Minutes per session: 0 min  . Stress: Not at all  Relationships  . Social Herbalist on phone: Not on file    Gets together: Not on file    Attends religious service: Not on file    Active member of club or organization: Not on file    Attends meetings of clubs or  organizations: Not on file    Relationship status: Married  Other Topics Concern  . Not on file  Social History Narrative   Two natural children and three step.  Lives with wife.    Tobacco Counseling Counseling given: Not Answered   Clinical Intake:  Pre-visit preparation completed: Yes  Pain : No/denies pain     Nutritional Status: BMI > 30  Obese Nutritional Risks: None Diabetes: Yes CBG done?: No Did pt. bring in CBG monitor from home?: No  How often do you need to have someone help you when you read instructions, pamphlets, or other written materials from your doctor or pharmacy?: 1 - Never  Nutrition Risk Assessment:  Has the patient had any N/V/D within the last 2 months?  No  Does the patient have any non-healing wounds?  No  Has the patient had any unintentional weight loss or weight gain?  No   Diabetes:  Is the patient diabetic?  Yes  If diabetic, was a CBG obtained today?  No  Did the patient bring in their glucometer from home?  No  How often do you monitor your CBG's? Checks periodically .   Financial Strains and Diabetes Management:  Are you having any financial strains with the device, your supplies or your medication? No .  Does the patient want to be seen by Chronic Care Management for management of their diabetes?  No  Would the patient like to be referred to a Nutritionist or for Diabetic Management?  No   Diabetic Exams:  Diabetic Eye Exam: Overdue for diabetic eye exam. Pt has been advised about the importance in completing this exam.  Diabetic Foot Exam: Completed 12/17/2017.   Interpreter Needed?: No  Information entered by ::  HillLPN  Activities of Daily Living In your present state of health, do you have any difficulty performing the following activities: 10/27/2018  Hearing? N  Comment no hearing aids  Vision? Y  Comment reading glasses,going to schedule appt with Dr.Woodard  Difficulty concentrating or making decisions? N   Walking or climbing stairs? N  Dressing or bathing? N  Doing errands, shopping? N  Preparing Food and eating ? N  Using the Toilet? N  In the past six months, have you accidently leaked urine? N  Do you have problems with loss of bowel control? N  Managing your Medications? N  Managing your Finances? N  Housekeeping or managing your Housekeeping? N  Some recent data might be hidden     Immunizations and Health Maintenance Immunization History  Administered  Date(s) Administered  . Influenza, High Dose Seasonal PF 12/12/2016, 12/17/2017  . Influenza,inj,Quad PF,6+ Mos 11/18/2012, 12/21/2015  . Pneumococcal Conjugate-13 12/02/2012  . Pneumococcal Polysaccharide-23 06/11/2017  . Td 11/20/2004, 12/21/2010  . Tdap 12/21/2010   Health Maintenance Due  Topic Date Due  . OPHTHALMOLOGY EXAM  10/17/2017  . INFLUENZA VACCINE  09/12/2018  . URINE MICROALBUMIN  12/18/2018    Patient Care Team: Olin Hauser, DO as PCP - General (Family Medicine)  Indicate any recent Medical Services you may have received from other than [facility] providers in the past year (date may be approximate).    Assessment:   This is a routine wellness examination for [place].  Hearing/Vision screen  Hearing Screening   125Hz  250Hz  500Hz  1000Hz  2000Hz  3000Hz  4000Hz  6000Hz  8000Hz   Right ear:           Left ear:           Vision Screening Comments: Goes to eye dr annually   Dietary issues and exercise activities discussed: Current Exercise Habits: The patient does not participate in regular exercise at present, Exercise limited by: None identified  Goals   None    Depression Screen PHQ 2/9 Scores 10/27/2018 09/15/2018 12/17/2017 06/11/2017  PHQ - 2 Score 0 0 4 0  PHQ- 9 Score - - 8 -    Fall Risk Fall Risk  10/27/2018 09/15/2018 12/17/2017 06/11/2017 12/12/2016  Falls in the past year? 1 0 0 No No  Number falls in past yr: 0 - - - -  Injury with Fall? 0 - - - -  Follow up - Falls evaluation completed -  - -    FALL RISK PREVENTION PERTAINING TO THE HOME:  Any stairs in or around the home? No  If so, are there any without handrails? n/a  Home free of loose throw rugs in walkways, pet beds, electrical cords, etc? Yes  Adequate lighting in your home to reduce risk of falls? Yes   ASSISTIVE DEVICES UTILIZED TO PREVENT FALLS:  Life alert? No  Use of a cane, walker or w/c? No  Grab bars in the bathroom? No  Shower chair or bench in shower? No  Elevated toilet seat or a handicapped toilet? No    TIMED UP AND GO:  Was the test performed? Yes .  Length of time to ambulate 10 feet: 11 sec.   GAIT:  Appearance of gait: Gait steady and fast without the use of an assistive device.  Education: Fall risk prevention has been discussed.  Intervention(s) required? No   DME/home health order needed?  No    Cognitive Function:     6CIT Screen 10/27/2018  What Year? 0 points  What month? 0 points  What time? 0 points  Count back from 20 0 points  Months in reverse 0 points  Repeat phrase 0 points  Total Score 0    Screening Tests Health Maintenance  Topic Date Due  . OPHTHALMOLOGY EXAM  10/17/2017  . INFLUENZA VACCINE  09/12/2018  . URINE MICROALBUMIN  12/18/2018  . FOOT EXAM  12/18/2018  . COLON CANCER SCREENING ANNUAL FOBT  12/31/2018  . HEMOGLOBIN A1C  04/14/2019  . TETANUS/TDAP  12/20/2020  . COLONOSCOPY  03/15/2025  . Hepatitis C Screening  Completed  . PNA vac Low Risk Adult  Completed    Qualifies for Shingles Vaccine? Yes  Zostavax completed n/a. Due for Shingrix. Education has been provided regarding the importance of this vaccine. Pt has been advised to call  insurance company to determine out of pocket expense. Advised may also receive vaccine at local pharmacy or Health Dept. Verbalized acceptance and understanding.  Tdap: up to date   Flu Vaccine: Due for Flu vaccine. Does the patient want to receive this vaccine today?  No . Education has been provided  regarding the importance of this vaccine but still declined. Advised may receive this vaccine at local pharmacy or Health Dept. Aware to provide a copy of the vaccination record if obtained from local pharmacy or Health Dept. Verbalized acceptance and understanding.  Pneumococcal Vaccine: up to date   Cancer Screenings:   Colorectal Screening: Completed 03/16/2015. Repeat every 10 years  Lung Cancer Screening: (Low Dose CT Chest recommended if Age 20-80 years, 30 pack-year currently smoking OR have quit w/in 15years.) does qualify.  Declined currently   Additional Screening:  Hepatitis C Screening: does qualify; Completed 01/11/2015  Vision Screening: Recommended annual ophthalmology exams for early detection of glaucoma and other disorders of the eye. Is the patient up to date with their annual eye exam?  Yes  Who is the provider or what is the name of the office in which the pt attends annual eye exams? Was going in [place], is switching to Dr.Woodard since he just moved.   Dental Screening: Recommended annual dental exams for proper oral hygiene  Community Resource Referral:  CRR required this visit?  No        Plan:  I have personally reviewed and addressed the Medicare Annual Wellness questionnaire and have noted the following in the patient's chart:  A. Medical and social history B. Use of alcohol, tobacco or illicit drugs  C. Current medications and supplements D. Functional ability and status E.  Nutritional status F.  Physical activity G. Advance directives H. List of other physicians I.  Hospitalizations, surgeries, and ER visits in previous 12 months J.  [place] such as hearing and vision if needed, cognitive and depression L. Referrals and appointments   In addition, I have reviewed and discussed with patient certain preventive protocols, quality metrics, and best practice recommendations. A written personalized care plan for preventive services as well  as general preventive health recommendations were provided to patient.   Signed,    Bevelyn Ngo, LPN   [phone]  Nurse Health Advisor    Nurse Notes: none

## 2018-10-27 NOTE — Progress Notes (Signed)
Provided patient with information on BP cuffs, he will call his insurance to see what is covered and will call back. Provided patient will my direct number. Will order per Dr.[name] once patient calls back with insurance preferred method.

## 2018-10-28 NOTE — Assessment & Plan Note (Signed)
Mostly Controlled cholesterol on statin lifestyle Last lipid panel 10/2018  Plan: 1. Continue current meds - Rosuvastatin 20mg  daily 2. Not on ASA 3. Encourage improved lifestyle - low carb/cholesterol, reduce portion size, continue improving regular exercise

## 2018-10-28 NOTE — Assessment & Plan Note (Signed)
Well-controlled DM with A1c 6.4 previous 6.1 No known complications or hypoglycemia. Complications - hyperlipidemia, obesity, GERD - increases risk of future cardiovascular complications   Plan:  1. Continue current therapy - Metformin 500mg  daily IR - Future consider new DM medications if indicated 2. Encourage improved lifestyle - low carb, low sugar diet, reduce portion size, continue improving regular exercise 3. Check CBG , bring log to next visit for review 4. Continue Statin - Check urine microalbumin = 0 - defer ACEi ARB for now 5. UTD DM Foot. Advised to schedule DM ophtho exam, send record - Woodard 6. Follow-up 3 months

## 2018-11-16 DIAGNOSIS — H401131 Primary open-angle glaucoma, bilateral, mild stage: Secondary | ICD-10-CM | POA: Diagnosis not present

## 2018-11-16 DIAGNOSIS — E119 Type 2 diabetes mellitus without complications: Secondary | ICD-10-CM | POA: Diagnosis not present

## 2018-11-16 LAB — HM DIABETES EYE EXAM

## 2018-11-17 ENCOUNTER — Encounter: Payer: Self-pay | Admitting: Family Medicine

## 2018-12-08 ENCOUNTER — Other Ambulatory Visit: Payer: Self-pay

## 2018-12-08 ENCOUNTER — Ambulatory Visit (INDEPENDENT_AMBULATORY_CARE_PROVIDER_SITE_OTHER): Payer: PPO

## 2018-12-08 DIAGNOSIS — Z23 Encounter for immunization: Secondary | ICD-10-CM | POA: Diagnosis not present

## 2019-01-27 ENCOUNTER — Other Ambulatory Visit: Payer: Self-pay

## 2019-01-27 ENCOUNTER — Encounter: Payer: Self-pay | Admitting: Family Medicine

## 2019-01-27 ENCOUNTER — Ambulatory Visit (INDEPENDENT_AMBULATORY_CARE_PROVIDER_SITE_OTHER): Payer: PPO | Admitting: Family Medicine

## 2019-01-27 VITALS — BP 153/67 | HR 82 | Temp 97.7°F | Ht 69.0 in | Wt 236.2 lb

## 2019-01-27 DIAGNOSIS — E669 Obesity, unspecified: Secondary | ICD-10-CM | POA: Diagnosis not present

## 2019-01-27 DIAGNOSIS — H9313 Tinnitus, bilateral: Secondary | ICD-10-CM | POA: Diagnosis not present

## 2019-01-27 DIAGNOSIS — E1169 Type 2 diabetes mellitus with other specified complication: Secondary | ICD-10-CM | POA: Diagnosis not present

## 2019-01-27 LAB — POCT GLYCOSYLATED HEMOGLOBIN (HGB A1C): Hemoglobin A1C: 6.5 % — AB (ref 4.0–5.6)

## 2019-01-27 MED ORDER — ONETOUCH VERIO VI STRP: ORAL_STRIP | 3 refills | Status: AC

## 2019-01-27 NOTE — Patient Instructions (Addendum)
Thank you for coming to the office today.  Check BP now about 2 times a week now, not needed to check twice a day.  Ordered OneTouch Verio test strips to pharmacy.  Continue current medicines., let me know if need refill.  Future cologuard in 2 years approximately.  May consider Flonase for congestion.  Tinnitus can be normal.   Please schedule a Follow-up Appointment to: Return in about 4 months (around 05/28/2019) for 4 month follow-up DM A1c, HTN.  If you have any other questions or concerns, please feel free to call the office or send a message through [place]. You may also schedule an earlier appointment if necessary.  Additionally, you may be receiving a survey about your experience at our office within a few days to 1 week by e-mail or mail. We value your feedback.  [name], DO [place]

## 2019-01-27 NOTE — Progress Notes (Signed)
Subjective:    Patient ID: Paul Johnston, male    DOB: 1950-04-24, 68 y.o.   MRN: [id number]  Paul Johnston is a 68 y.o. male presenting on 01/27/2019 for Diabetes   HPI   CHRONIC DM, Type 2: A1c due today, previous 6.4 CBGs:Infrequent checks Meds:Metformin 500mg  once daily Reports good compliance. Tolerating well w/o side-effects Currentlynot on ACEi/ARB Lifestyle: - Diet (improving low carb DM diet)  - Exercise: limited DM EYe exam Dr Ellin Mayhew 11/2018 Denies hypoglycemia, polyuria, visual changes, numbness or tingling.  Elevated BP Reports not checking BP at home. No prior problems. 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 Has ringing now abnormal sound.  Congestion/Cough chronic History of [place] - developed pneumonia, 1 week in hospital. In past he had work up from Dr Melvyn Novas (Pulmonology) had work up for COPD, ruled out at that time.   Health Maintenance:  Colon CA Screening: Last Colonoscopy (done by 03/16/15 GI), results with diverticulosis without polyps, good for up to 10 years. Currently asymptomatic. No known family history of colon CA. Can consider Cologuard in approx 2-3 years. He previously did FOBT yearly from prior PCP.  Depression screen [facility] 2/9 10/27/2018 09/15/2018 12/17/2017  Decreased Interest 0 0 1  Down, Depressed, Hopeless 0 0 3  PHQ - 2 Score 0 0 4  Altered sleeping - - 1  Tired, decreased energy - - 1  Change in appetite - - 1  Feeling bad or failure about yourself  - - 1  Trouble concentrating - - 0  Moving slowly or fidgety/restless - - 0  Suicidal thoughts - - 0  PHQ-9 Score - - 8  Difficult doing work/chores - - Somewhat difficult    Social History   Tobacco Use  . Smoking status: Former Smoker    Packs/day: 1.00    Years: 33.00    Pack years: 33.00    Types: Cigarettes    Quit date: 09/06/2008    Years  since quitting: 10.4  . Smokeless tobacco: Former Network engineer Use Topics  . Alcohol use: No  . Drug use: No    Review of Systems  Constitutional: Negative for activity change, appetite change, chills, diaphoresis, fatigue and fever.  HENT: Positive for tinnitus. Negative for congestion and hearing loss.   Eyes: Negative for visual disturbance.  Respiratory: Negative for cough, chest tightness, shortness of breath and wheezing.   Cardiovascular: Negative for chest pain, palpitations and leg swelling.  Gastrointestinal: Negative for abdominal pain, anal bleeding, blood in stool, constipation, diarrhea, nausea and vomiting.  Endocrine: Negative for cold intolerance.  Genitourinary: Negative for dysuria, frequency and hematuria.  Musculoskeletal: Negative for arthralgias, back pain and neck pain.  Skin: Negative for rash.  Allergic/Immunologic: Negative for environmental allergies.  Neurological: Negative for dizziness, weakness, light-headedness, numbness and headaches.  Hematological: Negative for adenopathy.  Psychiatric/Behavioral: Negative for behavioral problems, dysphoric mood and sleep disturbance. The patient is not nervous/anxious.    Per HPI unless specifically indicated above     Objective:    BP (!) 153/67   Pulse 82   Temp 97.7 F (36.5 C) (Oral)   Ht 5\' 9"  (1.753 m)   Wt 236 lb 3.2 oz (107.1 kg)   BMI 34.88 kg/m   Wt Readings from Last 3 Encounters:  01/27/19 236 lb 3.2 oz (107.1 kg)  10/27/18 233 lb (105.7 kg)  10/27/18 233 lb 8 oz (105.9 kg)    Physical Exam Vitals and nursing note reviewed.  Constitutional:      General: He is not in acute distress.    Appearance: He is well-developed. He is not diaphoretic.     Comments: Well-appearing, comfortable, cooperative  HENT:     Head: Normocephalic and atraumatic.  Eyes:     General:        Right eye: No discharge.        Left eye: No discharge.     Conjunctiva/sclera: Conjunctivae normal.  Neck:      Thyroid: No thyromegaly.  Cardiovascular:     Rate and Rhythm: Normal rate and regular rhythm.     Heart sounds: Normal heart sounds. No murmur.  Pulmonary:     Effort: Pulmonary effort is normal. No respiratory distress.     Breath sounds: Normal breath sounds. No wheezing or rales.  Musculoskeletal:        General: Normal range of motion.     Cervical back: Normal range of motion and neck supple.  Lymphadenopathy:     Cervical: No cervical adenopathy.  Skin:    General: Skin is warm and dry.     Findings: No erythema or rash.  Neurological:     Mental Status: He is alert and oriented to person, place, and time.  Psychiatric:        Behavior: Behavior normal.     Comments: Well groomed, good eye contact, normal speech and thoughts      Diabetic Foot Exam - Simple   Simple Foot Form Diabetic Foot exam was performed with the following findings: Yes 01/27/2019  1:56 PM  Visual Inspection See comments: Yes Sensation Testing Intact to touch and monofilament testing bilaterally: Yes Pulse Check Posterior Tibialis and Dorsalis pulse intact bilaterally: Yes Comments Bilateral feet with dry skin dermatitis, callus formation, no ulceration. No monofilament testing.     Results for orders placed or performed in visit on 01/27/19  POCT HgB A1C  Result Value Ref Range   Hemoglobin A1C 6.5 (A) 4.0 - 5.6 %      Assessment & Plan:   Problem List Items Addressed This Visit    Type 2 diabetes mellitus with other specified complication ([place]) - Primary    Well-controlled DM with A1c 6.5 No known complications or hypoglycemia. Complications - hyperlipidemia, obesity, GERD - increases risk of future cardiovascular complications   Plan:  1. Continue current therapy - Metformin 500mg  daily IR - Future consider new DM medications if indicated 2. Encourage improved lifestyle - low carb, low sugar diet, reduce portion size, continue improving regular exercise 3. Check CBG , bring log to  next visit for review 4. Continue Statin 5. UTD DM Foot, Eye 6. Follow-up 4 months       Relevant Medications   ONETOUCH VERIO test strip   Other Relevant Orders   POCT HgB A1C (Completed)   Tinnitus, bilateral   Obesity (BMI 30.0-34.9)      #Tinnitus Reassurance Consider future referral to ENT If indicated.  Meds ordered this encounter  Medications  . ONETOUCH VERIO test strip    Sig: Check blood sugar up to 2 times daily as instructed    Dispense:  200 strip    Refill:  3    E11.69     Follow up plan: Return in about 4 months (around 05/28/2019) for 4 month follow-up DM A1c, HTN.   [name], DO [facility]  [facility] 01/27/2019, 1:39 PM

## 2019-01-28 NOTE — Assessment & Plan Note (Signed)
Well-controlled DM with A1c 6.5 No known complications or hypoglycemia. Complications - hyperlipidemia, obesity, GERD - increases risk of future cardiovascular complications   Plan:  1. Continue current therapy - Metformin 500mg  daily IR - Future consider new DM medications if indicated 2. Encourage improved lifestyle - low carb, low sugar diet, reduce portion size, continue improving regular exercise 3. Check CBG , bring log to next visit for review 4. Continue Statin 5. UTD DM Foot, Eye 6. Follow-up 4 months

## 2019-03-03 ENCOUNTER — Telehealth: Payer: Self-pay | Admitting: Family Medicine

## 2019-03-03 NOTE — Chronic Care Management (AMB) (Signed)
Chronic Care Management   Note  03/03/2019 Name: Paul Johnston MRN: [id number] DOB: November 15, 1950  Paul Johnston is a 69 y.o. year old male who is a primary care patient of Olin Hauser, DO. I reached out to [employer] by phone today in response to a referral sent by Paul Johnston health plan.     Paul Johnston was given information about Chronic Care Management services today including:  1. CCM service includes personalized support from designated clinical staff supervised by his physician, including individualized plan of care and coordination with other care providers 2. 24/7 contact phone numbers for assistance for urgent and routine care needs. 3. Service will only be billed when office clinical staff spend 20 minutes or more in a month to coordinate care. 4. Only one practitioner may furnish and bill the service in a calendar month. 5. The patient may stop CCM services at any time (effective at the end of the month) by phone call to the office staff. 6. The patient will be responsible for cost sharing (co-pay) of up to 20% of the service fee (after annual deductible is met).  Patient did not agree to enrollment in care management services and does not wish to consider at this time.  Follow up plan: The patient has been provided with contact information for the care management team and has been advised to call with any health related questions or concerns.   Noreene Larsson, [place], [place], [place] [address] Direct Dial: [phone] [email or web address] Website: [email or web address]

## 2019-05-28 ENCOUNTER — Encounter: Payer: Self-pay | Admitting: Family Medicine

## 2019-05-28 ENCOUNTER — Other Ambulatory Visit: Payer: Self-pay

## 2019-05-28 ENCOUNTER — Ambulatory Visit (INDEPENDENT_AMBULATORY_CARE_PROVIDER_SITE_OTHER): Payer: PPO | Admitting: Family Medicine

## 2019-05-28 VITALS — BP 140/72 | HR 74 | Temp 97.7°F | Resp 16 | Ht 69.0 in | Wt 243.6 lb

## 2019-05-28 DIAGNOSIS — E559 Vitamin D deficiency, unspecified: Secondary | ICD-10-CM | POA: Diagnosis not present

## 2019-05-28 DIAGNOSIS — E1169 Type 2 diabetes mellitus with other specified complication: Secondary | ICD-10-CM

## 2019-05-28 LAB — POCT GLYCOSYLATED HEMOGLOBIN (HGB A1C): Hemoglobin A1C: 6.2 % — AB (ref 4.0–5.6)

## 2019-05-28 MED ORDER — LOSARTAN POTASSIUM 25 MG PO TABS: 25.0000 mg | ORAL_TABLET | Freq: Every day | ORAL | 1 refills | Status: DC

## 2019-05-28 NOTE — Patient Instructions (Addendum)
Thank you for coming to the office today.  Recent Labs    10/15/18 1130 01/27/19 1355 05/28/19 1400  HGBA1C 6.4* 6.5* 6.2*   Keep on Metformin, keep up the great work  Work on improving weight and lifestyle.  Keep track of BP readings  New med Losartan 25mg  daily for BP - caution if you get any allergy swelling facial or throat, stop med call us or go to hospital if severe, very rare reaction.  Lab only 4 weeks.  DUE for FASTING BLOOD WORK (no food or drink after midnight before the lab appointment, only water or coffee without cream/sugar on the morning of)  SCHEDULE "Lab Only" visit in the morning at the clinic for lab draw in 5-6 MONTHS   - Make sure Lab Only appointment is at about 1 week before your next appointment, so that results will be available  For Lab Results, once available within 2-3 days of blood draw, you can can log in to MyChart online to view your results and a brief explanation. Also, we can discuss results at next follow-up visit.    Please schedule a Follow-up Appointment to: Return in about 6 months (around 11/27/2019) for 4 weeks non fasting lab only then 5-6 month after 10/27/19 Annual Physical.  If you have any other questions or concerns, please feel free to call the office or send a message through [place]. You may also schedule an earlier appointment if necessary.  Additionally, you may be receiving a survey about your experience at our office within a few days to 1 week by e-mail or mail. We value your feedback.  [name], DO [place]

## 2019-05-28 NOTE — Assessment & Plan Note (Signed)
Continue on treatment Check lab in 4 weeks

## 2019-05-28 NOTE — Progress Notes (Signed)
Subjective:    Patient ID: Paul Johnston, male    DOB: 1950-08-29, 69 y.o.   MRN: [id number]  Paul Johnston is a 69 y.o. male presenting on 05/28/2019 for Diabetes   HPI   CHRONIC DM, Type 2: A1c due today, previous 6.4 to 6.5 CBGs:Infrequent checks Meds:Metformin 500mg oncedaily Reports good compliance. Tolerating well w/o side-effects Currentlynot on ACEi/ARB Lifestyle: Weight up by 7 lbs due to covid19 restrictions - Diet (improving low carb DM diet) - Exercise:limited, but now goal to improve DM EYe exam Dr Ellin Mayhew 11/2018 Denies hypoglycemia, polyuria, visual changes, numbness or tingling.  Elevated BP Improved readings now at home 130/80 avg. Current Meds -None, never on Denies CP, dyspnea, HA, edema, dizziness / lightheadedness  R Eyelid Skin tag, upper eyelid, says it gets in way of vision sometimes. Asking about removal.  Vitamin D deficiency On vitamin D3 5,000 daily, asking about lab result.   Depression screen [facility] 2/9 05/28/2019 10/27/2018 09/15/2018  Decreased Interest 0 0 0  Down, Depressed, Hopeless 0 0 0  PHQ - 2 Score 0 0 0  Altered sleeping - - -  Tired, decreased energy - - -  Change in appetite - - -  Feeling bad or failure about yourself  - - -  Trouble concentrating - - -  Moving slowly or fidgety/restless - - -  Suicidal thoughts - - -  PHQ-9 Score - - -  Difficult doing work/chores - - -    Social History   Tobacco Use  . Smoking status: Former Smoker    Packs/day: 1.00    Years: 33.00    Pack years: 33.00    Types: Cigarettes    Quit date: 09/06/2008    Years since quitting: 10.7  . Smokeless tobacco: Former Network engineer Use Topics  . Alcohol use: No  . Drug use: No    Review of Systems Per HPI unless specifically indicated above     Objective:    BP 140/72   Pulse 74   Temp 97.7 F (36.5 C) (Temporal)   Resp 16   Ht 5\' 9"  (1.753 m)   Wt 243 lb 9.6 oz (110.5 kg)   BMI 35.97 kg/m   Wt Readings from Last 3  Encounters:  05/28/19 243 lb 9.6 oz (110.5 kg)  01/27/19 236 lb 3.2 oz (107.1 kg)  10/27/18 233 lb (105.7 kg)    Physical Exam Vitals and nursing note reviewed.  Constitutional:      General: He is not in acute distress.    Appearance: He is well-developed. He is obese. He is not diaphoretic.     Comments: Well-appearing, comfortable, cooperative  HENT:     Head: Normocephalic and atraumatic.  Eyes:     General:        Right eye: No discharge.        Left eye: No discharge.     Conjunctiva/sclera: Conjunctivae normal.     Comments: R upper eyelid, small skin tag  Neck:     Thyroid: No thyromegaly.  Cardiovascular:     Rate and Rhythm: Normal rate and regular rhythm.     Heart sounds: Normal heart sounds. No murmur.  Pulmonary:     Effort: Pulmonary effort is normal. No respiratory distress.     Breath sounds: Normal breath sounds. No wheezing or rales.  Musculoskeletal:        General: Normal range of motion.     Cervical back: Normal range of motion and neck supple.  Lymphadenopathy:     Cervical: No cervical adenopathy.  Skin:    General: Skin is warm and dry.     Findings: No erythema or rash.  Neurological:     Mental Status: He is alert and oriented to person, place, and time.  Psychiatric:        Behavior: Behavior normal.     Comments: Well groomed, good eye contact, normal speech and thoughts       Results for orders placed or performed in visit on 05/28/19  POCT HgB A1C  Result Value Ref Range   Hemoglobin A1C 6.2 (A) 4.0 - 5.6 %      Assessment & Plan:   Problem List Items Addressed This Visit    Vitamin D deficiency    Continue on treatment Check lab in 4 weeks      Type 2 diabetes mellitus with other specified complication ([place]) - Primary    Well-controlled DM with A1c 6.2, improved now No known complications or hypoglycemia. Complications - hyperlipidemia, obesity, GERD - increases risk of future cardiovascular complications   Plan:  1.  Continue current therapy - Metformin 500mg  daily IR - Future consider new DM medications if indicated 2. Encourage improved lifestyle - low carb, low sugar diet, reduce portion size, continue improving regular exercise 3. Check CBG , bring log to next visit for review 4. Continue Statin - Start Losartan 25mg  daily ARB low dose, can help BP and protect kidneys, caution on risk of rare angioedema side effect. Check BMET in 4 weeks 5. UTD DM Foot, Eye      Relevant Medications   losartan (COZAAR) 25 MG tablet   Other Relevant Orders   POCT HgB A1C (Completed)   BASIC METABOLIC PANEL WITH GFR      #Elevated BP without HTN WIll start ARB for DM renal protection can help manage BP as well Encourage lifestyle, diet, wt loss  Meds ordered this encounter  Medications  . losartan (COZAAR) 25 MG tablet    Sig: Take 1 tablet (25 mg total) by mouth daily.    Dispense:  90 tablet    Refill:  1      Follow up plan: Return in about 6 months (around 11/27/2019) for 4 weeks non fasting lab only then 5-6 month after 10/27/19 Annual Physical.  Future labs ordered for 4 weeks BMET + Vit D added  Paul Johnston, [facility] 05/28/2019, 1:48 PM

## 2019-05-28 NOTE — Assessment & Plan Note (Signed)
Well-controlled DM with A1c 6.2, improved now No known complications or hypoglycemia. Complications - hyperlipidemia, obesity, GERD - increases risk of future cardiovascular complications   Plan:  1. Continue current therapy - Metformin 500mg  daily IR - Future consider new DM medications if indicated 2. Encourage improved lifestyle - low carb, low sugar diet, reduce portion size, continue improving regular exercise 3. Check CBG , bring log to next visit for review 4. Continue Statin - Start Losartan 25mg  daily ARB low dose, can help BP and protect kidneys, caution on risk of rare angioedema side effect. Check BMET in 4 weeks 5. UTD DM Foot, Eye

## 2019-06-25 ENCOUNTER — Other Ambulatory Visit: Payer: PPO

## 2019-06-25 ENCOUNTER — Other Ambulatory Visit: Payer: Self-pay

## 2019-06-25 DIAGNOSIS — E1169 Type 2 diabetes mellitus with other specified complication: Secondary | ICD-10-CM | POA: Diagnosis not present

## 2019-06-25 LAB — BASIC METABOLIC PANEL WITH GFR
BUN: 13 mg/dL (ref 7–25)
CO2: 32 mmol/L (ref 20–32)
Calcium: 9.6 mg/dL (ref 8.6–10.3)
Chloride: 102 mmol/L (ref 98–110)
Creat: 0.99 mg/dL (ref 0.70–1.25)
GFR, Est African American: 91 mL/min/{1.73_m2} (ref >=60)
GFR, Est Non African American: 78 mL/min/{1.73_m2} (ref >=60)
Glucose, Bld: 128 mg/dL — ABNORMAL HIGH (ref 65–99)
Potassium: 4.9 mmol/L (ref 3.5–5.3)
Sodium: 140 mmol/L (ref 135–146)

## 2019-09-06 ENCOUNTER — Other Ambulatory Visit: Payer: Self-pay | Admitting: Family Medicine

## 2019-09-06 DIAGNOSIS — K219 Gastro-esophageal reflux disease without esophagitis: Secondary | ICD-10-CM

## 2019-10-22 ENCOUNTER — Telehealth: Payer: Self-pay | Admitting: Family Medicine

## 2019-10-22 DIAGNOSIS — E1169 Type 2 diabetes mellitus with other specified complication: Secondary | ICD-10-CM

## 2019-10-22 DIAGNOSIS — N400 Enlarged prostate without lower urinary tract symptoms: Secondary | ICD-10-CM

## 2019-10-22 DIAGNOSIS — Z0000 Encounter for general adult medical examination without abnormal findings: Secondary | ICD-10-CM

## 2019-10-22 DIAGNOSIS — E669 Obesity, unspecified: Secondary | ICD-10-CM

## 2019-10-22 DIAGNOSIS — E559 Vitamin D deficiency, unspecified: Secondary | ICD-10-CM

## 2019-10-22 NOTE — Telephone Encounter (Signed)
Signed orders  [name], [facility] 10/22/2019, 4:21 PM

## 2019-10-25 ENCOUNTER — Other Ambulatory Visit: Payer: PPO

## 2019-10-25 ENCOUNTER — Other Ambulatory Visit: Payer: Self-pay

## 2019-10-25 DIAGNOSIS — N400 Enlarged prostate without lower urinary tract symptoms: Secondary | ICD-10-CM | POA: Diagnosis not present

## 2019-10-25 DIAGNOSIS — E785 Hyperlipidemia, unspecified: Secondary | ICD-10-CM | POA: Diagnosis not present

## 2019-10-25 DIAGNOSIS — E559 Vitamin D deficiency, unspecified: Secondary | ICD-10-CM | POA: Diagnosis not present

## 2019-10-25 DIAGNOSIS — E669 Obesity, unspecified: Secondary | ICD-10-CM | POA: Diagnosis not present

## 2019-10-25 DIAGNOSIS — Z0000 Encounter for general adult medical examination without abnormal findings: Secondary | ICD-10-CM | POA: Diagnosis not present

## 2019-10-25 DIAGNOSIS — E1169 Type 2 diabetes mellitus with other specified complication: Secondary | ICD-10-CM | POA: Diagnosis not present

## 2019-10-26 LAB — CBC WITH DIFFERENTIAL/PLATELET
Absolute Monocytes: 747 cells/uL (ref 200–950)
Basophils Absolute: 87 cells/uL (ref 0–200)
Basophils Relative: 0.9 %
Eosinophils Absolute: 485 cells/uL (ref 15–500)
Eosinophils Relative: 5 %
HCT: 46.7 % (ref 38.5–50.0)
Hemoglobin: 14.9 g/dL (ref 13.2–17.1)
Lymphs Abs: 2629 cells/uL (ref 850–3900)
MCH: 28.4 pg (ref 27.0–33.0)
MCHC: 31.9 g/dL — ABNORMAL LOW (ref 32.0–36.0)
MCV: 89.1 fL (ref 80.0–100.0)
MPV: 10.4 fL (ref 7.5–12.5)
Monocytes Relative: 7.7 %
Neutro Abs: 5752 cells/uL (ref 1500–7800)
Neutrophils Relative %: 59.3 %
Platelets: 196 10*3/uL (ref 140–400)
RBC: 5.24 10*6/uL (ref 4.20–5.80)
RDW: 13.2 % (ref 11.0–15.0)
Total Lymphocyte: 27.1 %
WBC: 9.7 10*3/uL (ref 3.8–10.8)

## 2019-10-26 LAB — HEMOGLOBIN A1C
Hgb A1c MFr Bld: 6.6 % of total Hgb — ABNORMAL HIGH (ref <5.7)
Mean Plasma Glucose: 143 (calc)
eAG (mmol/L): 7.9 (calc)

## 2019-10-26 LAB — VITAMIN D 25 HYDROXY (VIT D DEFICIENCY, FRACTURES): Vit D, 25-Hydroxy: 61 ng/mL (ref 30–100)

## 2019-10-26 LAB — LIPID PANEL
Cholesterol: 98 mg/dL (ref <200)
HDL: 39 mg/dL — ABNORMAL LOW (ref >=40)
LDL Cholesterol (Calc): 34 mg/dL (calc)
Non-HDL Cholesterol (Calc): 59 mg/dL (calc) (ref <130)
Total CHOL/HDL Ratio: 2.5 (calc) (ref <5.0)
Triglycerides: 169 mg/dL — ABNORMAL HIGH (ref <150)

## 2019-10-26 LAB — COMPLETE METABOLIC PANEL WITH GFR
AG Ratio: 1.6 (calc) (ref 1.0–2.5)
ALT: 16 U/L (ref 9–46)
AST: 19 U/L (ref 10–35)
Albumin: 4.2 g/dL (ref 3.6–5.1)
Alkaline phosphatase (APISO): 33 U/L — ABNORMAL LOW (ref 35–144)
BUN: 14 mg/dL (ref 7–25)
CO2: 31 mmol/L (ref 20–32)
Calcium: 9.5 mg/dL (ref 8.6–10.3)
Chloride: 101 mmol/L (ref 98–110)
Creat: 0.91 mg/dL (ref 0.70–1.25)
GFR, Est African American: 101 mL/min/{1.73_m2} (ref >=60)
GFR, Est Non African American: 87 mL/min/{1.73_m2} (ref >=60)
Globulin: 2.6 g/dL (calc) (ref 1.9–3.7)
Glucose, Bld: 105 mg/dL — ABNORMAL HIGH (ref 65–99)
Potassium: 4.1 mmol/L (ref 3.5–5.3)
Sodium: 140 mmol/L (ref 135–146)
Total Bilirubin: 0.6 mg/dL (ref 0.2–1.2)
Total Protein: 6.8 g/dL (ref 6.1–8.1)

## 2019-10-26 LAB — PSA: PSA: 0.5 ng/mL (ref <=4.0)

## 2019-11-01 ENCOUNTER — Ambulatory Visit (INDEPENDENT_AMBULATORY_CARE_PROVIDER_SITE_OTHER): Payer: PPO | Admitting: Family Medicine

## 2019-11-01 ENCOUNTER — Other Ambulatory Visit: Payer: Self-pay

## 2019-11-01 ENCOUNTER — Encounter: Payer: Self-pay | Admitting: Family Medicine

## 2019-11-01 VITALS — BP 139/63 | HR 81 | Temp 97.5°F | Resp 16 | Ht 69.0 in | Wt 248.6 lb

## 2019-11-01 DIAGNOSIS — E559 Vitamin D deficiency, unspecified: Secondary | ICD-10-CM | POA: Diagnosis not present

## 2019-11-01 DIAGNOSIS — Z0000 Encounter for general adult medical examination without abnormal findings: Secondary | ICD-10-CM

## 2019-11-01 DIAGNOSIS — E1169 Type 2 diabetes mellitus with other specified complication: Secondary | ICD-10-CM

## 2019-11-01 DIAGNOSIS — E785 Hyperlipidemia, unspecified: Secondary | ICD-10-CM | POA: Diagnosis not present

## 2019-11-01 NOTE — Assessment & Plan Note (Addendum)
BMI >36, comorbid condition Type 2 Diabetes, HLD Encourage improve lifestyle wt loss as discussed

## 2019-11-01 NOTE — Assessment & Plan Note (Signed)
Well-controlled DM with A1c 6.6 but elevated from last check No known complications or hypoglycemia. Complications - hyperlipidemia, obesity, GERD - increases risk of future cardiovascular complications   Plan:  1. INCREASE current therapy - Metformin 500mg  IR to BID dosing now, has enough rx - Future consider new DM medications if indicated - handout given, discussed GLP1 2. Encourage improved lifestyle - low carb, low sugar diet, reduce portion size, continue improving regular exercise 3. Check CBG , bring log to next visit for review 4. Continue Statin, ARB low dose 5. UTD DM Foot, Eye - next DM eye Dr Ellin Mayhew in October 2021

## 2019-11-01 NOTE — Assessment & Plan Note (Signed)
Mostly Controlled cholesterol on statin lifestyle Elevated TG Last lipid panel 10/2019  Plan: 1. Continue current meds - Rosuvastatin 20mg  daily 2. Not on ASA 3. Encourage improved lifestyle - low carb/cholesterol, reduce portion size, continue improving regular exercise

## 2019-11-01 NOTE — Patient Instructions (Addendum)
Thank you for coming to the office today.  Go ahead and increase Metformin 500mg  to one pill twice a day with meal.  Call insurance find cost and coverage of the following   Rybelsus (pill - oral semaglutide or ozempic) - once daily, taken first thing in morning without other meds  Ozempic (injection once week Trulicity (injection once week  We can get these newer meds at low cost if you are interested.  3 benefits - 1 significantly reduced A1c sugar, and may be able to reduce or stop metformin in future - 2 reduced appetite and weight loss with good results - 3 cardiovascular risk reduction, less likely to have heart attack/stroke   Please schedule a Follow-up Appointment to: Return in about 4 months (around 03/02/2020) for 4 month follow-up DM A1c.  If you have any other questions or concerns, please feel free to call the office or send a message through [place]. You may also schedule an earlier appointment if necessary.  Additionally, you may be receiving a survey about your experience at our office within a few days to 1 week by e-mail or mail. We value your feedback.  [name], DO [place]

## 2019-11-01 NOTE — Progress Notes (Signed)
Subjective:    Patient ID: Paul Johnston, male    DOB: [birth year], 69 y.o.   MRN: [id number]  Paul Johnston is a 69 y.o. male presenting on 11/01/2019 for Annual Exam   HPI   Here for Annual Physical and Lab Review.  CHRONIC DM, Type 2 Morbid Obestiy BMI >36 Last A1c lab up to 6.6, prior 6.2 range. CBGs:Infrequent checks Meds:Metformin 500mg  once daily Reports good compliance. Tolerating well w/o side-effects Currentlynot on ACEi/ARB Lifestyle: Weight gain 12 lbs in past 9 months. Admits more winded with active - Diet (improving low carb DM diet)  - Exercise: limited - Previous Eye Doctor - Dr Ellin Mayhew in [place] - will return in October for next diabetic eye exam. Denies hypoglycemia, polyuria, visual changes, numbness or tingling.  Elevated BP Last visit he was started on Losartan ARB 25mg  daily. Home BP readings logged several times per month avg 115-120s / 70s Current Meds - Losartan 25mg  daily Denies CP, dyspnea, HA, edema, dizziness / lightheadedness  HYPERLIPIDEMIA: - Reports no concerns. Last lipid panelwas 10/2019 - Still elevated TG >160 - Currently takingRosuvastatin 20mg  daily, tolerating well without side effects or myalgias  BPH DRE from previous PCP Dr Laurance Flatten, was normal except mild enlarged prostate. He has some mild urinary symptoms. Last PSA 0.5 negative - repeat now is stable, 0.5   Health Maintenance:  UTD [place] vaccine, done in 03/2019  Will return for Flu shot in October 2021  Depression screen [facility] 2/9 11/01/2019 05/28/2019 10/27/2018  Decreased Interest 0 0 0  Down, Depressed, Hopeless 0 0 0  PHQ - 2 Score 0 0 0  Altered sleeping - - -  Tired, decreased energy - - -  Change in appetite - - -  Feeling bad or failure about yourself  - - -  Trouble concentrating - - -  Moving slowly or fidgety/restless - - -  Suicidal thoughts - - -  PHQ-9 Score - - -  Difficult doing work/chores - - -    Past Medical History:  Diagnosis Date    . Allergy   . Arthritis   . GERD (gastroesophageal reflux disease)   . Hyperlipidemia   . Non-melanoma skin cancer   . Ruptured intervertebral disc   . Sleep apnea    Was diagnosed with moderate to light sleep apnea  . Ulcer    Past Surgical History:  Procedure Laterality Date  . APPENDECTOMY    . COLONOSCOPY    . FRACTURE SURGERY  1980's   Lower 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  Marland Kitchen HERNIA REPAIR  2014?   Umbilical hernia. Mesh installed.  . INSERTION OF MESH N/A 07/16/2012   Procedure: INSERTION OF MESH;  Surgeon: Harl Bowie, MD;  Location: [place];  Service: General;  Laterality: N/A;  . ORBITAL FRACTURE SURGERY    . UMBILICAL HERNIA REPAIR N/A 07/16/2012   Procedure: HERNIA REPAIR UMBILICAL WITH MESH;  Surgeon: Harl Bowie, MD;  Location: [place];  Service: General;  Laterality: N/A;  . UPPER GI ENDOSCOPY     Social History   Socioeconomic History  . Marital status: Divorced    Spouse name: Not on file  . Number of children: 2  . Years of education: Not on file  . Highest education level: Not on file  Occupational History    Employer: [employer]  Tobacco Use  . Smoking status: Former Smoker    Packs/day: 1.00    Years: 33.00  Pack years: 33.00    Types: Cigarettes    Quit date: 09/06/2008    Years since quitting: 11.1  . Smokeless tobacco: Former Network engineer  . Vaping Use: Never used  Substance and Sexual Activity  . Alcohol use: No  . Drug use: No  . Sexual activity: Not on file  Other Topics Concern  . Not on file  Social History Narrative   Two natural children and three step.  Lives with wife.    Social Determinants of Health   Financial Resource Strain:   . Difficulty of Paying Living Expenses: Not on file  Food Insecurity:   . Worried About Charity fundraiser in the Last Year: Not on file  . Ran Out of Food in the Last Year: Not on file  Transportation Needs:   . Lack of  Transportation (Medical): Not on file  . Lack of Transportation (Non-Medical): Not on file  Physical Activity:   . Days of Exercise per Week: Not on file  . Minutes of Exercise per Session: Not on file  Stress:   . Feeling of Stress : Not on file  Social Connections:   . Frequency of Communication with Friends and Family: Not on file  . Frequency of Social Gatherings with Friends and Family: Not on file  . Attends Religious Services: Not on file  . Active Member of Clubs or Organizations: Not on file  . Attends Archivist Meetings: Not on file  . Marital Status: Not on file  Intimate Partner Violence:   . Fear of Current or Ex-Partner: Not on file  . Emotionally Abused: Not on file  . Physically Abused: Not on file  . Sexually Abused: Not on file   Family History  Problem Relation Age of Onset  . Emphysema Father        died age 60  . Heart failure Father   . COPD Father   . Cancer Mother   . Heart disease Paternal Grandfather   . COPD Paternal Grandfather    Current Outpatient Medications on File Prior to Visit  Medication Sig  . Cholecalciferol (VITAMIN D3) 5000 units CAPS Take by mouth. 1 a day  . esomeprazole (NEXIUM) 40 MG capsule TAKE 1 CAPSULE(40 MG) BY MOUTH DAILY  . Lancets (ONETOUCH ULTRASOFT) lancets Use as instructed  . losartan (COZAAR) 25 MG tablet Take 1 tablet (25 mg total) by mouth daily.  . metFORMIN (GLUCOPHAGE) 500 MG tablet Take 1 tablet (500 mg total) by mouth 2 (two) times daily with a meal.  . ONETOUCH VERIO test strip Check blood sugar up to 2 times daily as instructed  . rosuvastatin (CRESTOR) 20 MG tablet Take 1 tablet (20 mg total) by mouth daily.   No current facility-administered medications on file prior to visit.    Review of Systems  Constitutional: Negative for activity change, appetite change, chills, diaphoresis, fatigue and fever.  HENT: Negative for congestion and hearing loss.   Eyes: Negative for visual disturbance.    Respiratory: Negative for apnea, cough, chest tightness, shortness of breath and wheezing.   Cardiovascular: Negative for chest pain, palpitations and leg swelling.  Gastrointestinal: Negative for abdominal pain, anal bleeding, blood in stool, constipation, diarrhea, nausea and vomiting.  Endocrine: Negative for cold intolerance.  Genitourinary: Negative for difficulty urinating, dysuria, frequency and hematuria.  Musculoskeletal: Negative for arthralgias, back pain and neck pain.  Skin: Negative for rash.  Allergic/Immunologic: Negative for environmental allergies.  Neurological: Negative for dizziness,  weakness, light-headedness, numbness and headaches.  Hematological: Negative for adenopathy.  Psychiatric/Behavioral: Negative for behavioral problems, dysphoric mood and sleep disturbance. The patient is not nervous/anxious.    Per HPI unless specifically indicated above       Objective:    BP 139/63   Pulse 81   Temp (!) 97.5 F (36.4 C) (Temporal)   Resp 16   Ht 5\' 9"  (1.753 m)   Wt 248 lb 9.6 oz (112.8 kg)   SpO2 97%   BMI 36.71 kg/m   Wt Readings from Last 3 Encounters:  11/01/19 248 lb 9.6 oz (112.8 kg)  05/28/19 243 lb 9.6 oz (110.5 kg)  01/27/19 236 lb 3.2 oz (107.1 kg)    Physical Exam Vitals and nursing note reviewed.  Constitutional:      General: He is not in acute distress.    Appearance: He is well-developed. He is obese. He is not diaphoretic.     Comments: Well-appearing, comfortable, cooperative  HENT:     Head: Normocephalic and atraumatic.  Eyes:     General:        Right eye: No discharge.        Left eye: No discharge.     Conjunctiva/sclera: Conjunctivae normal.     Pupils: Pupils are equal, round, and reactive to light.  Neck:     Thyroid: No thyromegaly.  Cardiovascular:     Rate and Rhythm: Normal rate and regular rhythm.     Heart sounds: Normal heart sounds. No murmur heard.   Pulmonary:     Effort: Pulmonary effort is normal. No  respiratory distress.     Breath sounds: Normal breath sounds. No wheezing or rales.  Abdominal:     General: Bowel sounds are normal. There is no distension.     Palpations: Abdomen is soft. There is no mass.     Tenderness: There is no abdominal tenderness.  Musculoskeletal:        General: No tenderness. Normal range of motion.     Cervical back: Normal range of motion and neck supple.     Comments: Upper / Lower Extremities: - Normal muscle tone, strength bilateral upper extremities 5/5, lower extremities 5/5  Lymphadenopathy:     Cervical: No cervical adenopathy.  Skin:    General: Skin is warm and dry.     Findings: No erythema or rash.  Neurological:     Mental Status: He is alert and oriented to person, place, and time.     Comments: Distal sensation intact to light touch all extremities  Psychiatric:        Behavior: Behavior normal.     Comments: Well groomed, good eye contact, normal speech and thoughts     Diabetic Foot Exam - Simple   Simple Foot Form Diabetic Foot exam was performed with the following findings: Yes   Visual Inspection See comments: Yes Sensation Testing Intact to touch and monofilament testing bilaterally: Yes Pulse Check Posterior Tibialis and Dorsalis pulse intact bilaterally: Yes Comments Bilateral callus formation forefoot, great toe. Thickened toenails.     Recent Labs    01/27/19 1355 05/28/19 1400 10/25/19 0855  HGBA1C 6.5* 6.2* 6.6*     Results for orders placed or performed in visit on 10/22/19  VITAMIN D 25 Hydroxy (Vit-D Deficiency, Fractures)  Result Value Ref Range   Vit D, 25-Hydroxy 61 30 - 100 ng/mL  PSA  Result Value Ref Range   PSA 0.50 < OR = 4.0 ng/mL  Lipid panel  Result Value Ref Range   Cholesterol 98 <200 mg/dL   HDL 39 (L) > OR = 40 mg/dL   Triglycerides 169 (H) <150 mg/dL   LDL Cholesterol (Calc) 34 mg/dL (calc)   Total CHOL/HDL Ratio 2.5 <5.0 (calc)   Non-HDL Cholesterol (Calc) 59 <130 mg/dL (calc)    Hemoglobin A1c  Result Value Ref Range   Hgb A1c MFr Bld 6.6 (H) <5.7 % of total Hgb   Mean Plasma Glucose 143 (calc)   eAG (mmol/L) 7.9 (calc)  CBC with Differential/Platelet  Result Value Ref Range   WBC 9.7 3.8 - 10.8 Thousand/uL   RBC 5.24 4.20 - 5.80 Million/uL   Hemoglobin 14.9 13.2 - 17.1 g/dL   HCT 46.7 38 - 50 %   MCV 89.1 80.0 - 100.0 fL   MCH 28.4 27.0 - 33.0 pg   MCHC 31.9 (L) 32.0 - 36.0 g/dL   RDW 13.2 11.0 - 15.0 %   Platelets 196 140 - 400 Thousand/uL   MPV 10.4 7.5 - 12.5 fL   Neutro Abs 5,752 1,500 - 7,800 cells/uL   Lymphs Abs 2,629 850 - 3,900 cells/uL   Absolute Monocytes 747 200 - 950 cells/uL   Eosinophils Absolute 485 15 - 500 cells/uL   Basophils Absolute 87 0 - 200 cells/uL   Neutrophils Relative % 59.3 %   Total Lymphocyte 27.1 %   Monocytes Relative 7.7 %   Eosinophils Relative 5.0 %   Basophils Relative 0.9 %  COMPLETE METABOLIC PANEL WITH GFR  Result Value Ref Range   Glucose, Bld 105 (H) 65 - 99 mg/dL   BUN 14 7 - 25 mg/dL   Creat 0.91 0.70 - 1.25 mg/dL   GFR, Est Non African American 87 > OR = 60 mL/min/1.22m2   GFR, Est African American 101 > OR = 60 mL/min/1.57m2   BUN/Creatinine Ratio NOT APPLICABLE 6 - 22 (calc)   Sodium 140 135 - 146 mmol/L   Potassium 4.1 3.5 - 5.3 mmol/L   Chloride 101 98 - 110 mmol/L   CO2 31 20 - 32 mmol/L   Calcium 9.5 8.6 - 10.3 mg/dL   Total Protein 6.8 6.1 - 8.1 g/dL   Albumin 4.2 3.6 - 5.1 g/dL   Globulin 2.6 1.9 - 3.7 g/dL (calc)   AG Ratio 1.6 1.0 - 2.5 (calc)   Total Bilirubin 0.6 0.2 - 1.2 mg/dL   Alkaline phosphatase (APISO) 33 (L) 35 - 144 U/L   AST 19 10 - 35 U/L   ALT 16 9 - 46 U/L      Assessment & Plan:   Problem List Items Addressed This Visit    Vitamin D deficiency   Type 2 diabetes mellitus with other specified complication ([place])    Well-controlled DM with A1c 6.6 but elevated from last check No known complications or hypoglycemia. Complications - hyperlipidemia, obesity, GERD -  increases risk of future cardiovascular complications   Plan:  1. INCREASE current therapy - Metformin 500mg  IR to BID dosing now, has enough rx - Future consider new DM medications if indicated - handout given, discussed GLP1 2. Encourage improved lifestyle - low carb, low sugar diet, reduce portion size, continue improving regular exercise 3. Check CBG , bring log to next visit for review 4. Continue Statin, ARB low dose 5. UTD DM Foot, Eye - next DM eye Dr Ellin Mayhew in October 2021      Morbid obesity ([place])    BMI >36, comorbid condition Type 2 Diabetes, HLD  Encourage improve lifestyle wt loss as discussed      Hyperlipidemia associated with type 2 diabetes mellitus ([place])    Mostly Controlled cholesterol on statin lifestyle Elevated TG Last lipid panel 10/2019  Plan: 1. Continue current meds - Rosuvastatin 20mg  daily 2. Not on ASA 3. Encourage improved lifestyle - low carb/cholesterol, reduce portion size, continue improving regular exercise       Other Visit Diagnoses    Annual physical exam    -  Primary     Updated Health Maintenance information - Due for Flu shot return in October 2021 - Consider COVID antibody test if want to before 3rd Booster Reviewed recent lab results with patient Encouraged improvement to lifestyle with diet and exercise - Goal of weight loss    No orders of the defined types were placed in this encounter.   Follow up plan: Return in about 4 months (around 03/02/2020) for 4 month follow-up DM A1c.  [name], [facility] 11/01/2019, 2:22 PM

## 2019-11-02 ENCOUNTER — Telehealth: Payer: Self-pay | Admitting: Family Medicine

## 2019-11-02 DIAGNOSIS — E1169 Type 2 diabetes mellitus with other specified complication: Secondary | ICD-10-CM

## 2019-11-02 DIAGNOSIS — K219 Gastro-esophageal reflux disease without esophagitis: Secondary | ICD-10-CM

## 2019-11-02 MED ORDER — METFORMIN HCL 500 MG PO TABS: 500.0000 mg | ORAL_TABLET | Freq: Two times a day (BID) | ORAL | 3 refills | Status: DC

## 2019-11-02 MED ORDER — LOSARTAN POTASSIUM 25 MG PO TABS: 25.0000 mg | ORAL_TABLET | Freq: Every day | ORAL | 3 refills | Status: DC

## 2019-11-02 MED ORDER — ESOMEPRAZOLE MAGNESIUM 40 MG PO CPDR: DELAYED_RELEASE_CAPSULE | ORAL | 3 refills | Status: DC

## 2019-11-02 NOTE — Addendum Note (Signed)
Addended by: Olin Hauser on: 11/02/2019 03:16 PM   Modules accepted: Orders

## 2019-11-02 NOTE — Telephone Encounter (Signed)
Pt stopped by wanting to know if losartan 25 MG, Nexium 40 MG was called in

## 2019-11-02 NOTE — Telephone Encounter (Signed)
Sent refills for Losartan and Nexium - 90 day with 3 refills to [place] 11/02/2019, 3:16 PM

## 2019-11-17 DIAGNOSIS — H40013 Open angle with borderline findings, low risk, bilateral: Secondary | ICD-10-CM | POA: Diagnosis not present

## 2019-11-17 DIAGNOSIS — E119 Type 2 diabetes mellitus without complications: Secondary | ICD-10-CM | POA: Diagnosis not present

## 2019-11-17 DIAGNOSIS — H2513 Age-related nuclear cataract, bilateral: Secondary | ICD-10-CM | POA: Diagnosis not present

## 2019-11-17 DIAGNOSIS — H1045 Other chronic allergic conjunctivitis: Secondary | ICD-10-CM | POA: Diagnosis not present

## 2019-11-17 LAB — HM DIABETES EYE EXAM

## 2019-12-09 ENCOUNTER — Other Ambulatory Visit: Payer: Self-pay

## 2019-12-09 ENCOUNTER — Ambulatory Visit (INDEPENDENT_AMBULATORY_CARE_PROVIDER_SITE_OTHER): Payer: PPO

## 2019-12-09 DIAGNOSIS — Z23 Encounter for immunization: Secondary | ICD-10-CM | POA: Diagnosis not present

## 2020-01-18 ENCOUNTER — Telehealth: Payer: Self-pay | Admitting: Family Medicine

## 2020-01-18 NOTE — Telephone Encounter (Signed)
He will check with insurance and will let you know once he will find out in his 02/2020 appointment also I had send him my chart message for correct spell for all those PPI med's he might reply through my chart before his appointment.

## 2020-01-18 NOTE — Telephone Encounter (Signed)
Please let patient know that alternatives to Esomeprazole include the following:  Omeprazole 40mg  daily Pantoprazole 40mg  daily Lansoprazole 30mg  daily  Those are the 3 options I would offer. Other one Dexilant is not in the same category and is higher tier.  Let me know which one he chooses after he checks cost.  [name], DO [facility] 01/18/2020, 10:05 AM

## 2020-03-03 ENCOUNTER — Other Ambulatory Visit: Payer: Self-pay

## 2020-03-03 ENCOUNTER — Encounter: Payer: Self-pay | Admitting: Family Medicine

## 2020-03-03 ENCOUNTER — Other Ambulatory Visit: Payer: Self-pay | Admitting: Family Medicine

## 2020-03-03 ENCOUNTER — Ambulatory Visit (INDEPENDENT_AMBULATORY_CARE_PROVIDER_SITE_OTHER): Payer: 59 | Admitting: Family Medicine

## 2020-03-03 VITALS — BP 118/62 | HR 72 | Ht 69.0 in | Wt 249.2 lb

## 2020-03-03 DIAGNOSIS — E1169 Type 2 diabetes mellitus with other specified complication: Secondary | ICD-10-CM

## 2020-03-03 DIAGNOSIS — E559 Vitamin D deficiency, unspecified: Secondary | ICD-10-CM

## 2020-03-03 DIAGNOSIS — E785 Hyperlipidemia, unspecified: Secondary | ICD-10-CM

## 2020-03-03 DIAGNOSIS — K219 Gastro-esophageal reflux disease without esophagitis: Secondary | ICD-10-CM | POA: Diagnosis not present

## 2020-03-03 DIAGNOSIS — N400 Enlarged prostate without lower urinary tract symptoms: Secondary | ICD-10-CM

## 2020-03-03 DIAGNOSIS — Z0000 Encounter for general adult medical examination without abnormal findings: Secondary | ICD-10-CM

## 2020-03-03 LAB — POCT GLYCOSYLATED HEMOGLOBIN (HGB A1C): Hemoglobin A1C: 6.4 % — AB (ref 4.0–5.6)

## 2020-03-03 MED ORDER — LOSARTAN POTASSIUM 25 MG PO TABS: 25.0000 mg | ORAL_TABLET | Freq: Every day | ORAL | 3 refills | Status: DC

## 2020-03-03 MED ORDER — OMEPRAZOLE 40 MG PO CPDR: 40.0000 mg | DELAYED_RELEASE_CAPSULE | Freq: Every day | ORAL | 3 refills | Status: DC

## 2020-03-03 NOTE — Progress Notes (Signed)
Subjective:    Patient ID: Paul Johnston, male    DOB: 04/24/1950, 70 y.o.   MRN: [id number]  Paul Johnston is a 70 y.o. male presenting on 03/03/2020 for Diabetes   HPI   CHRONIC DM, Type 2 Morbid Obestiy BMI >36 Last A1c lab up to 6.6, now due for repeat CBGs:Infrequent checks Meds:Metformin 500mg  BID Reports good compliance. Tolerating well w/o side-effects Currentlynot on ACEi/ARB Lifestyle: Weight stable in 3 months - Diet (improving low carb DM diet) - Exercise:limited - Previous Eye Doctor - Dr Ellin Mayhew in [place] - done 11/2019 Denies hypoglycemia, polyuria, visual changes, numbness or tingling.  Elevated BP Last visit he was started on Losartan ARB 25mg  daily. Home BP readings logged several times per month avg 115-120s / 70s Current Meds -Losartan 25mg  daily Denies CP, dyspnea, HA, edema, dizziness / lightheadedness  HYPERLIPIDEMIA: - Reports no concerns. Last lipid panelwas 10/2019 - Still elevated TG >160 - Currently takingRosuvastatin 20mg  daily, tolerating well without side effects or myalgias    Depression screen [facility] 2/9 11/01/2019 05/28/2019 10/27/2018  Decreased Interest 0 0 0  Down, Depressed, Hopeless 0 0 0  PHQ - 2 Score 0 0 0  Altered sleeping - - -  Tired, decreased energy - - -  Change in appetite - - -  Feeling bad or failure about yourself  - - -  Trouble concentrating - - -  Moving slowly or fidgety/restless - - -  Suicidal thoughts - - -  PHQ-9 Score - - -  Difficult doing work/chores - - -    Social History   Tobacco Use   Smoking status: Former Smoker    Packs/day: 1.00    Years: 33.00    Pack years: 33.00    Types: Cigarettes    Quit date: 09/06/2008    Years since quitting: 11.4   Smokeless tobacco: Former Counsellor Use: Never used  Substance Use Topics   Alcohol use: No   Drug use: No    Review of Systems Per HPI unless specifically indicated above     Objective:    BP 118/62 (BP Location:  Left Arm, Cuff Size: Normal)    Pulse 72    Ht 5\' 9"  (1.753 m)    Wt 249 lb 3.2 oz (113 kg)    SpO2 99%    BMI 36.80 kg/m   Wt Readings from Last 3 Encounters:  03/03/20 249 lb 3.2 oz (113 kg)  11/01/19 248 lb 9.6 oz (112.8 kg)  05/28/19 243 lb 9.6 oz (110.5 kg)    Physical Exam Vitals and nursing note reviewed.  Constitutional:      General: He is not in acute distress.    Appearance: He is well-developed and well-nourished. He is obese. He is not diaphoretic.     Comments: Well-appearing, comfortable, cooperative  HENT:     Head: Normocephalic and atraumatic.     Mouth/Throat:     Mouth: Oropharynx is clear and moist.  Eyes:     General:        Right eye: No discharge.        Left eye: No discharge.     Conjunctiva/sclera: Conjunctivae normal.  Neck:     Thyroid: No thyromegaly.  Cardiovascular:     Rate and Rhythm: Normal rate and regular rhythm.     Pulses: Intact distal pulses.     Heart sounds: Normal heart sounds. No murmur heard.   Pulmonary:  Effort: Pulmonary effort is normal. No respiratory distress.     Breath sounds: Normal breath sounds. No wheezing or rales.  Musculoskeletal:        General: No edema. Normal range of motion.     Cervical back: Normal range of motion and neck supple.  Lymphadenopathy:     Cervical: No cervical adenopathy.  Skin:    General: Skin is warm and dry.     Findings: No erythema or rash.  Neurological:     Mental Status: He is alert and oriented to person, place, and time.  Psychiatric:        Mood and Affect: Mood and affect normal.        Behavior: Behavior normal.     Comments: Well groomed, good eye contact, normal speech and thoughts      Recent Labs    05/28/19 1400 10/25/19 0855 03/03/20 1420  HGBA1C 6.2* 6.6* 6.4*    Results for orders placed or performed in visit on 03/03/20  POCT HgB A1C  Result Value Ref Range   Hemoglobin A1C 6.4 (A) 4.0 - 5.6 %      Assessment & Plan:   Problem List Items  Addressed This Visit    Type 2 diabetes mellitus with other specified complication ([place]) - Primary    Well-controlled DM with A1c 6.4 improved now No known complications or hypoglycemia. Complications - hyperlipidemia, obesity, GERD - increases risk of future cardiovascular complications  On ARB - Had mild elevated BP but home readings all normal.  Plan:  1. Continue current therapy - Metformin 500mg  IR to BID dosing now, has enough rx now - Future consider new DM medications if indicated - handout given, discussed GLP1 2. Encourage improved lifestyle - low carb, low sugar diet, reduce portion size, continue improving regular exercise 3. Check CBG , bring log to next visit for review 4. Continue Statin, ARB low dose      Relevant Medications   losartan (COZAAR) 25 MG tablet   Other Relevant Orders   POCT HgB A1C (Completed)   Morbid obesity ([facility])    BMI >36, comorbid condition Type 2 Diabetes, HLD Encourage improve lifestyle wt loss as discussed      Hyperlipidemia associated with type 2 diabetes mellitus ([place])    Mostly Controlled cholesterol on statin lifestyle Elevated TG Last lipid panel 10/2019  Plan: 1. Continue current meds - Rosuvastatin 20mg  daily 2. Not on ASA 3. Encourage improved lifestyle - low carb/cholesterol, reduce portion size, continue improving regular exercise      Relevant Medications   losartan (COZAAR) 25 MG tablet   GERD (gastroesophageal reflux disease)    Controlled GERD Will order rx now Omeprazole 40mg  daily, instead of previous Esomeprazole 40mg       Relevant Medications   omeprazole (PRILOSEC) 40 MG capsule      Meds ordered this encounter  Medications   omeprazole (PRILOSEC) 40 MG capsule    Sig: Take 1 capsule (40 mg total) by mouth daily before breakfast.    Dispense:  90 capsule    Refill:  3   losartan (COZAAR) 25 MG tablet    Sig: Take 1 tablet (25 mg total) by mouth daily.    Dispense:  90 tablet    Refill:  3      Follow up plan: Return in about 8 months (around 11/01/2020) for 8 month fasting lab only then 1 week later Annual Physical.  Future labs ordered for 10/2020   [name], DO  [facility] 03/03/2020, 2:15 PM

## 2020-03-03 NOTE — Assessment & Plan Note (Addendum)
Well-controlled DM with A1c 6.4 improved now No known complications or hypoglycemia. Complications - hyperlipidemia, obesity, GERD - increases risk of future cardiovascular complications  On ARB - Had mild elevated BP but home readings all normal.  Plan:  1. Continue current therapy - Metformin 500mg  IR to BID dosing now, has enough rx now - Future consider new DM medications if indicated - handout given, discussed GLP1 2. Encourage improved lifestyle - low carb, low sugar diet, reduce portion size, continue improving regular exercise 3. Check CBG , bring log to next visit for review 4. Continue Statin, ARB low dose

## 2020-03-03 NOTE — Patient Instructions (Addendum)
Thank you for coming to the office today.  Recent Labs    05/28/19 1400 10/25/19 0855 03/03/20 1420  HGBA1C 6.2* 6.6* 6.4*    Rx printed Omeprazole 40mg  daily   DUE for FASTING BLOOD WORK (no food or drink after midnight before the lab appointment, only water or coffee without cream/sugar on the morning of)  SCHEDULE "Lab Only" visit in the morning at the clinic for lab draw in 7-8 MONTHS   - Make sure Lab Only appointment is at about 1 week before your next appointment, so that results will be available  For Lab Results, once available within 2-3 days of blood draw, you can can log in to MyChart online to view your results and a brief explanation. Also, we can discuss results at next follow-up visit.   Please schedule a Follow-up Appointment to: Return in about 8 months (around 11/01/2020) for 8 month fasting lab only then 1 week later Annual Physical.  If you have any other questions or concerns, please feel free to call the office or send a message through [place]. You may also schedule an earlier appointment if necessary.  Additionally, you may be receiving a survey about your experience at our office within a few days to 1 week by e-mail or mail. We value your feedback.  [name], DO [facility]

## 2020-03-03 NOTE — Assessment & Plan Note (Signed)
Mostly Controlled cholesterol on statin lifestyle Elevated TG Last lipid panel 10/2019  Plan: 1. Continue current meds - Rosuvastatin 20mg daily 2. Not on ASA 3. Encourage improved lifestyle - low carb/cholesterol, reduce portion size, continue improving regular exercise 

## 2020-03-03 NOTE — Assessment & Plan Note (Signed)
Controlled GERD Will order rx now Omeprazole 40mg  daily, instead of previous Esomeprazole 40mg 

## 2020-03-03 NOTE — Assessment & Plan Note (Signed)
BMI >36, comorbid condition Type 2 Diabetes, HLD Encourage improve lifestyle wt loss as discussed 

## 2020-05-08 ENCOUNTER — Other Ambulatory Visit: Payer: Self-pay | Admitting: Family Medicine

## 2020-05-08 DIAGNOSIS — K219 Gastro-esophageal reflux disease without esophagitis: Secondary | ICD-10-CM

## 2020-05-08 NOTE — Telephone Encounter (Signed)
Requested medications are due for refill today. unknown  Requested medications are on the active medications list.  no  Last refill. unknown  Future visit scheduled.   yes  Notes to clinic.  Medication was d/c 03/03/2020

## 2020-05-23 ENCOUNTER — Ambulatory Visit: Payer: 59

## 2020-10-17 ENCOUNTER — Other Ambulatory Visit: Payer: Self-pay

## 2020-10-17 DIAGNOSIS — E559 Vitamin D deficiency, unspecified: Secondary | ICD-10-CM

## 2020-10-17 DIAGNOSIS — K219 Gastro-esophageal reflux disease without esophagitis: Secondary | ICD-10-CM

## 2020-10-17 DIAGNOSIS — N400 Enlarged prostate without lower urinary tract symptoms: Secondary | ICD-10-CM

## 2020-10-17 DIAGNOSIS — Z0000 Encounter for general adult medical examination without abnormal findings: Secondary | ICD-10-CM

## 2020-10-17 DIAGNOSIS — E1169 Type 2 diabetes mellitus with other specified complication: Secondary | ICD-10-CM

## 2020-10-17 DIAGNOSIS — E785 Hyperlipidemia, unspecified: Secondary | ICD-10-CM

## 2020-10-25 ENCOUNTER — Other Ambulatory Visit: Payer: 59

## 2020-10-25 ENCOUNTER — Other Ambulatory Visit: Payer: Self-pay

## 2020-10-25 DIAGNOSIS — E559 Vitamin D deficiency, unspecified: Secondary | ICD-10-CM | POA: Diagnosis not present

## 2020-10-25 DIAGNOSIS — E785 Hyperlipidemia, unspecified: Secondary | ICD-10-CM | POA: Diagnosis not present

## 2020-10-25 DIAGNOSIS — N400 Enlarged prostate without lower urinary tract symptoms: Secondary | ICD-10-CM | POA: Diagnosis not present

## 2020-10-25 DIAGNOSIS — E1169 Type 2 diabetes mellitus with other specified complication: Secondary | ICD-10-CM | POA: Diagnosis not present

## 2020-10-25 DIAGNOSIS — Z0000 Encounter for general adult medical examination without abnormal findings: Secondary | ICD-10-CM | POA: Diagnosis not present

## 2020-10-25 DIAGNOSIS — K219 Gastro-esophageal reflux disease without esophagitis: Secondary | ICD-10-CM | POA: Diagnosis not present

## 2020-10-26 LAB — COMPLETE METABOLIC PANEL WITH GFR
AG Ratio: 1.7 (calc) (ref 1.0–2.5)
ALT: 16 U/L (ref 9–46)
AST: 21 U/L (ref 10–35)
Albumin: 4.3 g/dL (ref 3.6–5.1)
Alkaline phosphatase (APISO): 35 U/L (ref 35–144)
BUN: 17 mg/dL (ref 7–25)
CO2: 29 mmol/L (ref 20–32)
Calcium: 9.3 mg/dL (ref 8.6–10.3)
Chloride: 104 mmol/L (ref 98–110)
Creat: 0.9 mg/dL (ref 0.70–1.35)
Globulin: 2.5 g/dL (calc) (ref 1.9–3.7)
Glucose, Bld: 121 mg/dL — ABNORMAL HIGH (ref 65–99)
Potassium: 4.4 mmol/L (ref 3.5–5.3)
Sodium: 141 mmol/L (ref 135–146)
Total Bilirubin: 0.6 mg/dL (ref 0.2–1.2)
Total Protein: 6.8 g/dL (ref 6.1–8.1)
eGFR: 93 mL/min/{1.73_m2} (ref >=60)

## 2020-10-26 LAB — LIPID PANEL
Cholesterol: 98 mg/dL (ref <200)
HDL: 36 mg/dL — ABNORMAL LOW (ref >=40)
LDL Cholesterol (Calc): 35 mg/dL (calc)
Non-HDL Cholesterol (Calc): 62 mg/dL (calc) (ref <130)
Total CHOL/HDL Ratio: 2.7 (calc) (ref <5.0)
Triglycerides: 206 mg/dL — ABNORMAL HIGH (ref <150)

## 2020-10-26 LAB — CBC WITH DIFFERENTIAL/PLATELET
Absolute Monocytes: 609 cells/uL (ref 200–950)
Basophils Absolute: 70 cells/uL (ref 0–200)
Basophils Relative: 0.8 %
Eosinophils Absolute: 374 cells/uL (ref 15–500)
Eosinophils Relative: 4.3 %
HCT: 45.6 % (ref 38.5–50.0)
Hemoglobin: 14.7 g/dL (ref 13.2–17.1)
Lymphs Abs: 2140 cells/uL (ref 850–3900)
MCH: 28.3 pg (ref 27.0–33.0)
MCHC: 32.2 g/dL (ref 32.0–36.0)
MCV: 87.7 fL (ref 80.0–100.0)
MPV: 10.8 fL (ref 7.5–12.5)
Monocytes Relative: 7 %
Neutro Abs: 5507 cells/uL (ref 1500–7800)
Neutrophils Relative %: 63.3 %
Platelets: 167 10*3/uL (ref 140–400)
RBC: 5.2 10*6/uL (ref 4.20–5.80)
RDW: 13.2 % (ref 11.0–15.0)
Total Lymphocyte: 24.6 %
WBC: 8.7 10*3/uL (ref 3.8–10.8)

## 2020-10-26 LAB — HEMOGLOBIN A1C
Hgb A1c MFr Bld: 6.5 % of total Hgb — ABNORMAL HIGH (ref <5.7)
Mean Plasma Glucose: 140 mg/dL
eAG (mmol/L): 7.7 mmol/L

## 2020-10-26 LAB — PSA: PSA: 0.54 ng/mL (ref <=4.00)

## 2020-10-26 LAB — VITAMIN D 25 HYDROXY (VIT D DEFICIENCY, FRACTURES): Vit D, 25-Hydroxy: 59 ng/mL (ref 30–100)

## 2020-11-01 ENCOUNTER — Other Ambulatory Visit: Payer: Self-pay | Admitting: Family Medicine

## 2020-11-01 ENCOUNTER — Ambulatory Visit (INDEPENDENT_AMBULATORY_CARE_PROVIDER_SITE_OTHER): Payer: 59 | Admitting: Family Medicine

## 2020-11-01 ENCOUNTER — Other Ambulatory Visit: Payer: Self-pay

## 2020-11-01 ENCOUNTER — Encounter: Payer: Self-pay | Admitting: Family Medicine

## 2020-11-01 VITALS — BP 144/74 | HR 76 | Ht 69.0 in | Wt 247.4 lb

## 2020-11-01 DIAGNOSIS — E1169 Type 2 diabetes mellitus with other specified complication: Secondary | ICD-10-CM

## 2020-11-01 DIAGNOSIS — Z0000 Encounter for general adult medical examination without abnormal findings: Secondary | ICD-10-CM

## 2020-11-01 DIAGNOSIS — E559 Vitamin D deficiency, unspecified: Secondary | ICD-10-CM | POA: Diagnosis not present

## 2020-11-01 DIAGNOSIS — G8929 Other chronic pain: Secondary | ICD-10-CM

## 2020-11-01 DIAGNOSIS — N400 Enlarged prostate without lower urinary tract symptoms: Secondary | ICD-10-CM

## 2020-11-01 DIAGNOSIS — E785 Hyperlipidemia, unspecified: Secondary | ICD-10-CM

## 2020-11-01 DIAGNOSIS — R911 Solitary pulmonary nodule: Secondary | ICD-10-CM

## 2020-11-01 MED ORDER — GABAPENTIN 100 MG PO CAPS: ORAL_CAPSULE | ORAL | 1 refills | Status: DC

## 2020-11-01 NOTE — Patient Instructions (Addendum)
Thank you for coming to the office today.  Colon Cancer Screening: - For all adults age 69+ routine colon cancer screening is highly recommended.     - Recent guidelines from [place] recommend starting age of 35 - Early detection of colon cancer is important, because often there are no warning signs or symptoms, also if found early usually it can be cured. Late stage is hard to treat.  - If you are not interested in Colonoscopy screening (if done and normal you could be cleared for 5 to 10 years until next due), then Cologuard is an excellent alternative for screening test for Colon Cancer. It is highly sensitive for detecting DNA of colon cancer from even the earliest stages. Also, there is NO bowel prep required. - If Cologuard is NEGATIVE, then it is good for 3 years before next due - If Cologuard is POSITIVE, then it is strongly advised to get a Colonoscopy, which allows the GI doctor to locate the source of the cancer or polyp (even very early stage) and treat it by removing it. ------------------------- If you would like to proceed with Cologuard (stool DNA test) - FIRST, call your insurance company and tell them you want to check cost of Cologuard tell them CPT Code [phone] (it may be completely covered and you could get for no cost, OR max cost without any coverage is about $600). Also, keep in mind if you do NOT open the kit, and decide not to do the test, you will NOT be charged, you should contact the company if you decide not to do the test. - If you want to proceed, you can notify us (phone message, [place], or at next visit) and we will order it for you. The test kit will be delivered to you house within about 1 week. Follow instructions to collect sample, you may call the company for any help or questions, 24/7 telephone support at [phone].   Please schedule a Follow-up Appointment to: Return in about 6 months (around 05/01/2021) for 6 month follow-up DM  A1c, HTN, back pain.  If you have any other questions or concerns, please feel free to call the office or send a message through [place]. You may also schedule an earlier appointment if necessary.  Additionally, you may be receiving a survey about your experience at our office within a few days to 1 week by e-mail or mail. We value your feedback.  [name], DO [facility]

## 2020-11-01 NOTE — Assessment & Plan Note (Signed)
Mostly Controlled cholesterol on statin lifestyle Elevated TG Last lipid panel 10/2020  Plan: 1. Continue current meds - Rosuvastatin 20mg  daily 2. Not on ASA 3. Encourage improved lifestyle - low carb/cholesterol, reduce portion size, continue improving regular exercise

## 2020-11-01 NOTE — Assessment & Plan Note (Signed)
Well-controlled DM with A1c 6.5 No known complications or hypoglycemia. Complications - hyperlipidemia, obesity, GERD - increases risk of future cardiovascular complications  On ARB - Had mild elevated BP but home readings all normal.  Plan:  1. Continue current therapy - Metformin 500mg  IR will keep daily for now - BID upset stomach - Future consider new DM medications if indicated 2. Encourage improved lifestyle - low carb, low sugar diet, reduce portion size, continue improving regular exercise 3. Check CBG , bring log to next visit for review 4. Continue Statin, ARB low dose

## 2020-11-01 NOTE — Assessment & Plan Note (Signed)
BMI >36, comorbid condition Type 2 Diabetes, HLD Encourage improve lifestyle wt loss as discussed

## 2020-11-01 NOTE — Progress Notes (Signed)
Subjective:    Patient ID: Paul Johnston, male    DOB: 1951/01/01, 70 y.o.   MRN: [id number]  Paul Johnston is a 70 y.o. male presenting on 11/01/2020 for Annual Exam   HPI  Here for Annual Physical and Lab Review.   CHRONIC DM, Type 2 Morbid Obestiy BMI >36 Last A1c lab up to 6.5 but stable CBGs: Infrequent checks Meds: Metformin 5100m once daily - last time he was up to x 2 pills but had loose stool diarrhea Reports good compliance. Tolerating well w/o side-effects Currently not on ACEi/ARB Lifestyle: Weight gain 12 lbs in past 9 months. Admits more winded with active - Diet (improving low carb DM diet)  - Exercise: limited - Dr WEllin MayhewDM Eye 11/22/20 Denies hypoglycemia, polyuria, visual changes, numbness or tingling.   Elevated BP Last visit he was started on Losartan ARB 212mdaily. Home BP readings logged several times per month avg 113-130 / 67-75 Current Meds - Losartan 2539maily Denies CP, dyspnea, HA, edema, dizziness / lightheadedness   HYPERLIPIDEMIA: - Reports no concerns. Last lipid panel was 10/2020 - Still elevated TG but normal low LDL - Currently taking Rosuvastatin 16m62mily, tolerating well without side effects or myalgias   BPH DRE from previous PCP Dr MoorLaurance Flattens normal except mild enlarged prostate. He has some mild urinary symptoms. Last PSA 0.54 negative 10/2020   History of Abnormal X-ray - nodular density identified RUL stable from 2014 to 2016, last x-ray 2018.   Health Maintenance:  History of Shingles in past. He is considering Shingrix.   UTD Pfizer COVID19 vaccine Due for booster bivalent when ready   Will return for Flu shot in October 2022  Last Colon CA Screening Colonoscopy 2017 Dr PatrCarol Adagative no specimens. Next due 10 year 2027. He prefers to do some testing inbetween.   Depression screen PHQ [facility] 11/01/2020 11/01/2019 05/28/2019  Decreased Interest 1 0 0  Down, Depressed, Hopeless 0 0 0  PHQ - 2 Score 1 0 0  Altered  sleeping 1 - -  Tired, decreased energy 1 - -  Change in appetite 0 - -  Feeling bad or failure about yourself  0 - -  Trouble concentrating 0 - -  Moving slowly or fidgety/restless 0 - -  Suicidal thoughts 0 - -  PHQ-9 Score 3 - -  Difficult doing work/chores Not difficult at all - -    Past Medical History:  Diagnosis Date   Allergy    Arthritis    GERD (gastroesophageal reflux disease)    Hyperlipidemia    Non-melanoma skin cancer    Ruptured intervertebral disc    Sleep apnea    Was diagnosed with moderate to light sleep apnea   Ulcer    Past Surgical History:  Procedure Laterality Date   APPENDECTOMY     COLONOSCOPY     FRACTURE SURGERY  1980's   Lower 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   HERNIA REPAIR  2014?   Umbilical hernia. Mesh installed.   INSERTION OF MESH N/A 07/16/2012   Procedure: INSERTION OF MESH;  Surgeon: DougHarl Bowie;  [facility];  Laterality: N/A;   ORBITAL FRACTURE SURGERY     UMBILICAL HERNIA REPAIR N/A 07/16/2012   Procedure: HERNIA REPAIR UMBILICAL WITH MESH;  Surgeon: DougHarl Bowie;  [facility];  Laterality: N/A;   UPPER GI ENDOSCOPY     Social  History   Socioeconomic History   Marital status: Divorced    Spouse name: Not on file   Number of children: 2   Years of education: Not on file   Highest education level: Not on file  Occupational History    Employer: [employer]  Tobacco Use   Smoking status: Former    Packs/day: 1.00    Years: 33.00    Pack years: 33.00    Types: Cigarettes    Quit date: 09/06/2008    Years since quitting: 12.1   Smokeless tobacco: Former  Scientific laboratory technician Use: Never used  Substance and Sexual Activity   Alcohol use: No   Drug use: No   Sexual activity: Not on file  Other Topics Concern   Not on file  Social History Narrative   Two natural children and three step.  Lives with wife.     Social Determinants of Health   Financial Resource Strain: Not on file  Food Insecurity: Not on file  Transportation Needs: Not on file  Physical Activity: Not on file  Stress: Not on file  Social Connections: Not on file  Intimate Partner Violence: Not on file   Family History  Problem Relation Age of Onset   Emphysema Father        died age 64   Heart failure Father    COPD Father    Cancer Mother    Heart disease Paternal Grandfather    COPD Paternal Grandfather    Current Outpatient Medications on File Prior to Visit  Medication Sig   Cholecalciferol (VITAMIN D3) 50 MCG (2000 UT) TABS Take 2,000 Units by mouth daily.   Lancets (ONETOUCH ULTRASOFT) lancets Use as instructed   losartan (COZAAR) 25 MG tablet Take 1 tablet (25 mg total) by mouth daily.   metFORMIN (GLUCOPHAGE) 500 MG tablet Take 1 tablet (500 mg total) by mouth 2 (two) times daily with a meal.   omeprazole (PRILOSEC) 40 MG capsule Take 1 capsule (40 mg total) by mouth daily before breakfast.   ONETOUCH VERIO test strip Check blood sugar up to 2 times daily as instructed   rosuvastatin (CRESTOR) 20 MG tablet Take 1 tablet (20 mg total) by mouth daily.   No current facility-administered medications on file prior to visit.    Review of Systems  Constitutional:  Negative for activity change, appetite change, chills, diaphoresis, fatigue and fever.  HENT:  Negative for congestion and hearing loss.   Eyes:  Negative for visual disturbance.  Respiratory:  Negative for cough, chest tightness, shortness of breath and wheezing.   Cardiovascular:  Negative for chest pain, palpitations and leg swelling.  Gastrointestinal:  Negative for abdominal pain, constipation, diarrhea, nausea and vomiting.  Genitourinary:  Negative for dysuria, frequency and hematuria.  Musculoskeletal:  Negative for arthralgias and neck pain.  Skin:  Negative for rash.  Neurological:  Negative for dizziness, weakness, light-headedness,  numbness and headaches.  Hematological:  Negative for adenopathy.  Psychiatric/Behavioral:  Negative for behavioral problems, dysphoric mood and sleep disturbance.   Per HPI unless specifically indicated above      Objective:    BP (!) 144/74   Pulse 76   Ht '5\' 9"'  (1.753 m)   Wt 247 lb 6.4 oz (112.2 kg)   SpO2 98%   BMI 36.53 kg/m   Wt Readings from Last 3 Encounters:  11/01/20 247 lb 6.4 oz (112.2 kg)  03/03/20 249 lb 3.2 oz (113 kg)  11/01/19 248 lb 9.6  oz (112.8 kg)    Physical Exam  Diabetic Foot Exam - Simple   Simple Foot Form Diabetic Foot exam was performed with the following findings: Yes 11/01/2020  2:47 PM  Visual Inspection See comments: Yes Sensation Testing Intact to touch and monofilament testing bilaterally: Yes Pulse Check Posterior Tibialis and Dorsalis pulse intact bilaterally: Yes Comments Dry skin with callus formation. Intact monofilament.      Recent Labs    03/03/20 1420 10/25/20 0916  HGBA1C 6.4* 6.5*    Results for orders placed or performed in visit on 10/17/20  PSA  Result Value Ref Range   PSA 0.54 < OR = 4.00 ng/mL  Lipid panel  Result Value Ref Range   Cholesterol 98 <200 mg/dL   HDL 36 (L) > OR = 40 mg/dL   Triglycerides 206 (H) <150 mg/dL   LDL Cholesterol (Calc) 35 mg/dL (calc)   Total CHOL/HDL Ratio 2.7 <5.0 (calc)   Non-HDL Cholesterol (Calc) 62 <130 mg/dL (calc)  COMPLETE METABOLIC PANEL WITH GFR  Result Value Ref Range   Glucose, Bld 121 (H) 65 - 99 mg/dL   BUN 17 7 - 25 mg/dL   Creat 0.90 0.70 - 1.35 mg/dL   eGFR 93 > OR = 60 mL/min/1.62m   BUN/Creatinine Ratio NOT APPLICABLE 6 - 22 (calc)   Sodium 141 135 - 146 mmol/L   Potassium 4.4 3.5 - 5.3 mmol/L   Chloride 104 98 - 110 mmol/L   CO2 29 20 - 32 mmol/L   Calcium 9.3 8.6 - 10.3 mg/dL   Total Protein 6.8 6.1 - 8.1 g/dL   Albumin 4.3 3.6 - 5.1 g/dL   Globulin 2.5 1.9 - 3.7 g/dL (calc)   AG Ratio 1.7 1.0 - 2.5 (calc)   Total Bilirubin 0.6 0.2 - 1.2 mg/dL    Alkaline phosphatase (APISO) 35 35 - 144 U/L   AST 21 10 - 35 U/L   ALT 16 9 - 46 U/L  CBC with Differential/Platelet  Result Value Ref Range   WBC 8.7 3.8 - 10.8 Thousand/uL   RBC 5.20 4.20 - 5.80 Million/uL   Hemoglobin 14.7 13.2 - 17.1 g/dL   HCT 45.6 38.5 - 50.0 %   MCV 87.7 80.0 - 100.0 fL   MCH 28.3 27.0 - 33.0 pg   MCHC 32.2 32.0 - 36.0 g/dL   RDW 13.2 11.0 - 15.0 %   Platelets 167 140 - 400 Thousand/uL   MPV 10.8 7.5 - 12.5 fL   Neutro Abs 5,507 1,500 - 7,800 cells/uL   Lymphs Abs 2,140 850 - 3,900 cells/uL   Absolute Monocytes 609 200 - 950 cells/uL   Eosinophils Absolute 374 15 - 500 cells/uL   Basophils Absolute 70 0 - 200 cells/uL   Neutrophils Relative % 63.3 %   Total Lymphocyte 24.6 %   Monocytes Relative 7.0 %   Eosinophils Relative 4.3 %   Basophils Relative 0.8 %  Hemoglobin A1c  Result Value Ref Range   Hgb A1c MFr Bld 6.5 (H) <5.7 % of total Hgb   Mean Plasma Glucose 140 mg/dL   eAG (mmol/L) 7.7 mmol/L  VITAMIN D 25 Hydroxy (Vit-D Deficiency, Fractures)  Result Value Ref Range   Vit D, 25-Hydroxy 59 30 - 100 ng/mL      Assessment & Plan:   Problem List Items Addressed This Visit     Vitamin D deficiency    Stable normal Vit D Can reduce to Vit D 2k daily  Type 2 diabetes mellitus with other specified complication ([place])    Well-controlled DM with A1c 6.5 No known complications or hypoglycemia. Complications - hyperlipidemia, obesity, GERD - increases risk of future cardiovascular complications  On ARB - Had mild elevated BP but home readings all normal.  Plan:  1. Continue current therapy - Metformin 521m IR will keep daily for now - BID upset stomach - Future consider new DM medications if indicated 2. Encourage improved lifestyle - low carb, low sugar diet, reduce portion size, continue improving regular exercise 3. Check CBG , bring log to next visit for review 4. Continue Statin, ARB low dose      Morbid obesity (HCC)    BMI >36,  comorbid condition Type 2 Diabetes, HLD Encourage improve lifestyle wt loss as discussed      Hyperlipidemia associated with type 2 diabetes mellitus ([place]    Mostly Controlled cholesterol on statin lifestyle Elevated TG Last lipid panel 10/2020  Plan: 1. Continue current meds - Rosuvastatin 22mdaily 2. Not on ASA 3. Encourage improved lifestyle - low carb/cholesterol, reduce portion size, continue improving regular exercise      BPH (benign prostatic hyperplasia)   Other Visit Diagnoses     Annual physical exam    -  Primary       Updated Health Maintenance information Future Flu Shot return when ready Future COVID Updated booster anytime Reviewed recent lab results with patient Encouraged improvement to lifestyle with diet and exercise Goal of weight loss  Additional topics - sciatica contact [place]af not improving will offer Gabapentin rx, counseling provided. [place]  Prior history 2018 last X-ray RUL nodule, will pursue CXR surveillance next, then can refer to Pulm Nodule program if interested.   No orders of the defined types were placed in this encounter.     Follow up plan: Return in about 6 months (around 05/01/2021) for 6 month follow-up DM A1c, HTN, back pain.  AlNobie PutnamDODillonedical Group 11/01/2020, 2:19 PM

## 2020-11-01 NOTE — Assessment & Plan Note (Signed)
Stable normal Vit D Can reduce to Vit D 2k daily

## 2020-11-15 ENCOUNTER — Encounter: Payer: Self-pay | Admitting: Family Medicine

## 2020-11-22 DIAGNOSIS — H01009 Unspecified blepharitis unspecified eye, unspecified eyelid: Secondary | ICD-10-CM | POA: Diagnosis not present

## 2020-11-22 DIAGNOSIS — H40013 Open angle with borderline findings, low risk, bilateral: Secondary | ICD-10-CM | POA: Diagnosis not present

## 2020-11-22 DIAGNOSIS — H2513 Age-related nuclear cataract, bilateral: Secondary | ICD-10-CM | POA: Diagnosis not present

## 2020-11-22 DIAGNOSIS — E119 Type 2 diabetes mellitus without complications: Secondary | ICD-10-CM | POA: Diagnosis not present

## 2020-11-22 DIAGNOSIS — H1045 Other chronic allergic conjunctivitis: Secondary | ICD-10-CM | POA: Diagnosis not present

## 2020-11-28 ENCOUNTER — Encounter: Payer: Self-pay | Admitting: Family Medicine

## 2020-11-28 ENCOUNTER — Other Ambulatory Visit: Payer: Self-pay

## 2020-11-28 ENCOUNTER — Ambulatory Visit (INDEPENDENT_AMBULATORY_CARE_PROVIDER_SITE_OTHER): Payer: 59

## 2020-11-28 VITALS — Wt 247.0 lb

## 2020-11-28 DIAGNOSIS — Z23 Encounter for immunization: Secondary | ICD-10-CM | POA: Diagnosis not present

## 2020-12-01 ENCOUNTER — Encounter: Payer: Self-pay | Admitting: Family Medicine

## 2020-12-25 ENCOUNTER — Encounter: Payer: Self-pay | Admitting: Internal Medicine

## 2020-12-25 ENCOUNTER — Ambulatory Visit (INDEPENDENT_AMBULATORY_CARE_PROVIDER_SITE_OTHER): Payer: 59 | Admitting: Internal Medicine

## 2020-12-25 ENCOUNTER — Other Ambulatory Visit: Payer: Self-pay

## 2020-12-25 VITALS — BP 135/62 | HR 68 | Temp 97.5°F | Resp 20 | Ht 69.0 in | Wt 248.6 lb

## 2020-12-25 DIAGNOSIS — B029 Zoster without complications: Secondary | ICD-10-CM

## 2020-12-25 MED ORDER — VALACYCLOVIR HCL 1 G PO TABS: 1000.0000 mg | ORAL_TABLET | Freq: Three times a day (TID) | ORAL | 0 refills | Status: DC

## 2020-12-25 NOTE — Progress Notes (Signed)
Subjective:    Patient ID: Paul Johnston, male    DOB: Sep 27, 1950, 70 y.o.   MRN: [id number]  HPI  Pt presents to the clinic today with c/o a rash. He noticed this 3 days ago. The rash itches and burns. He denies changes in soaps, lotions or detergents. No one in his home has a similar rash. He has not tried anything OTC for this.  Review of Systems     Past Medical History:  Diagnosis Date   Allergy    Arthritis    GERD (gastroesophageal reflux disease)    Hyperlipidemia    Non-melanoma skin cancer    Ruptured intervertebral disc    Sleep apnea    Was diagnosed with moderate to light sleep apnea   Ulcer     Current Outpatient Medications  Medication Sig Dispense Refill   Cholecalciferol (VITAMIN D3) 50 MCG (2000 UT) TABS Take 2,000 Units by mouth daily.     gabapentin (NEURONTIN) 100 MG capsule Start 1 capsule daily, increase by 1 cap every 2-3 days as tolerated up to 3 times a day, or may take 3 at once in evening. 90 capsule 1   Lancets (ONETOUCH ULTRASOFT) lancets Use as instructed 100 each 2   losartan (COZAAR) 25 MG tablet Take 1 tablet (25 mg total) by mouth daily. 90 tablet 3   metFORMIN (GLUCOPHAGE) 500 MG tablet Take 1 tablet (500 mg total) by mouth 2 (two) times daily with a meal. 180 tablet 3   omeprazole (PRILOSEC) 40 MG capsule Take 1 capsule (40 mg total) by mouth daily before breakfast. 90 capsule 3   ONETOUCH VERIO test strip Check blood sugar up to 2 times daily as instructed 200 strip 3   rosuvastatin (CRESTOR) 20 MG tablet Take 1 tablet (20 mg total) by mouth daily. 90 tablet 3   No current facility-administered medications for this visit.    Allergies  Allergen Reactions   Codeine    Biaxin [Clarithromycin] Other (See Comments)    GI upset   Mucinex [Guaifenesin Er] Nausea And Vomiting    Family History  Problem Relation Age of Onset   Emphysema Father        died age 29   Heart failure Father    COPD Father    Cancer Mother    Heart disease  Paternal Grandfather    COPD Paternal Grandfather     Social History   Socioeconomic History   Marital status: Divorced    Spouse name: Not on file   Number of children: 2   Years of education: Not on file   Highest education level: Not on file  Occupational History    Employer: [employer]  Tobacco Use   Smoking status: Former    Packs/day: 1.00    Years: 33.00    Pack years: 33.00    Types: Cigarettes    Quit date: 09/06/2008    Years since quitting: 12.3   Smokeless tobacco: Former  Scientific laboratory technician Use: Never used  Substance and Sexual Activity   Alcohol use: No   Drug use: No   Sexual activity: Not on file  Other Topics Concern   Not on file  Social History Narrative   Two natural children and three step.  Lives with wife.    Social Determinants of Health   Financial Resource Strain: Not on file  Food Insecurity: Not on file  Transportation Needs: Not on file  Physical Activity: Not on file  Stress: Not on file  Social Connections: Not on file  Intimate Partner Violence: Not on file     Constitutional: Denies fever, malaise, fatigue, headache or abrupt weight changes.  Respiratory: Denies difficulty breathing, shortness of breath, cough or sputum production.   Cardiovascular: Denies chest pain, chest tightness, palpitations or swelling in the hands or feet.  Skin: Pt reports rash of left hip. Denies ulcercations.   No other specific complaints in a complete review of systems (except as listed in HPI above).  Objective:   Physical Exam   BP 135/62 (BP Location: Right Arm, Patient Position: Sitting, Cuff Size: Large)   Pulse 68   Temp (!) 97.5 F (36.4 C) (Temporal)   Resp 20   Ht 5\' 9"  (1.753 m)   Wt 248 lb 9.6 oz (112.8 kg)   SpO2 98%   BMI 36.71 kg/m   Wt Readings from Last 3 Encounters:  11/28/20 247 lb (112 kg)  11/01/20 247 lb 6.4 oz (112.2 kg)  03/03/20 249 lb 3.2 oz (113 kg)    General: Appears his stated age, obese in  NAD. Skin: Clustered vesicular rash on erythematous base noted of left hip. Cardiovascular: Normal rate. Pulmonary/Chest: Normal effort. Musculoskeletal: No difficulty with gait.  Neurological: Alert and oriented.   BMET    Component Value Date/Time   NA 141 10/25/2020 0916   NA 139 06/11/2017 1625   K 4.4 10/25/2020 0916   CL 104 10/25/2020 0916   CO2 29 10/25/2020 0916   GLUCOSE 121 (H) 10/25/2020 0916   BUN 17 10/25/2020 0916   BUN 18 06/11/2017 1625   CREATININE 0.90 10/25/2020 0916   CALCIUM 9.3 10/25/2020 0916   GFRNONAA 87 10/25/2019 0855   GFRAA 101 10/25/2019 0855    Lipid Panel     Component Value Date/Time   CHOL 98 10/25/2020 0916   CHOL 120 06/09/2015 1702   CHOL 102 11/18/2012 1543   TRIG 206 (H) 10/25/2020 0916   TRIG 83 11/18/2012 1543   HDL 36 (L) 10/25/2020 0916   HDL 40 06/09/2015 1702   HDL 41 11/18/2012 1543   CHOLHDL 2.7 10/25/2020 0916   VLDL 12 06/02/2013 1509   LDLCALC 35 10/25/2020 0916   LDLCALC 44 11/18/2012 1543    CBC    Component Value Date/Time   WBC 8.7 10/25/2020 0916   RBC 5.20 10/25/2020 0916   HGB 14.7 10/25/2020 0916   HGB 14.4 06/11/2017 1625   HCT 45.6 10/25/2020 0916   HCT 43.6 06/11/2017 1625   PLT 167 10/25/2020 0916   PLT 209 06/11/2017 1625   MCV 87.7 10/25/2020 0916   MCV 87 06/11/2017 1625   MCH 28.3 10/25/2020 0916   MCHC 32.2 10/25/2020 0916   RDW 13.2 10/25/2020 0916   RDW 14.2 06/11/2017 1625   LYMPHSABS 2,140 10/25/2020 0916   LYMPHSABS 2.6 06/11/2017 1625   EOSABS 374 10/25/2020 0916   EOSABS 0.3 06/11/2017 1625   BASOSABS 70 10/25/2020 0916   BASOSABS 0.0 06/11/2017 1625    Hgb A1C Lab Results  Component Value Date   HGBA1C 6.5 (H) 10/25/2020           Assessment & Plan:   Shingles:  RX for Valtrex 1 gm PO TID x 7 days  Return precautions discussed  Webb Silversmith, NP This visit occurred during the SARS-CoV-2 public health emergency.  Safety protocols were in place, including  screening questions prior to the visit, additional usage of staff PPE, and extensive cleaning of  exam room while observing appropriate contact time as indicated for disinfecting solutions.

## 2020-12-25 NOTE — Patient Instructions (Signed)

## 2021-01-16 DIAGNOSIS — H02889 Meibomian gland dysfunction of unspecified eye, unspecified eyelid: Secondary | ICD-10-CM | POA: Diagnosis not present

## 2021-01-16 DIAGNOSIS — H04129 Dry eye syndrome of unspecified lacrimal gland: Secondary | ICD-10-CM | POA: Diagnosis not present

## 2021-01-16 DIAGNOSIS — H01009 Unspecified blepharitis unspecified eye, unspecified eyelid: Secondary | ICD-10-CM | POA: Diagnosis not present

## 2021-01-16 DIAGNOSIS — H1045 Other chronic allergic conjunctivitis: Secondary | ICD-10-CM | POA: Diagnosis not present

## 2021-01-16 DIAGNOSIS — H2513 Age-related nuclear cataract, bilateral: Secondary | ICD-10-CM | POA: Diagnosis not present

## 2021-01-16 DIAGNOSIS — E119 Type 2 diabetes mellitus without complications: Secondary | ICD-10-CM | POA: Diagnosis not present

## 2021-01-16 DIAGNOSIS — D23112 Other benign neoplasm of skin of right lower eyelid, including canthus: Secondary | ICD-10-CM | POA: Diagnosis not present

## 2021-01-16 DIAGNOSIS — D239 Other benign neoplasm of skin, unspecified: Secondary | ICD-10-CM | POA: Diagnosis not present

## 2021-01-16 DIAGNOSIS — H40019 Open angle with borderline findings, low risk, unspecified eye: Secondary | ICD-10-CM | POA: Diagnosis not present

## 2021-01-28 ENCOUNTER — Encounter: Payer: Self-pay | Admitting: Family Medicine

## 2021-01-29 ENCOUNTER — Other Ambulatory Visit: Payer: Self-pay

## 2021-01-29 DIAGNOSIS — E785 Hyperlipidemia, unspecified: Secondary | ICD-10-CM

## 2021-01-29 DIAGNOSIS — E1169 Type 2 diabetes mellitus with other specified complication: Secondary | ICD-10-CM

## 2021-01-29 DIAGNOSIS — K219 Gastro-esophageal reflux disease without esophagitis: Secondary | ICD-10-CM

## 2021-01-29 MED ORDER — METFORMIN HCL 500 MG PO TABS: 500.0000 mg | ORAL_TABLET | Freq: Two times a day (BID) | ORAL | 3 refills | Status: DC

## 2021-01-29 MED ORDER — OMEPRAZOLE 40 MG PO CPDR
40.0000 mg | DELAYED_RELEASE_CAPSULE | Freq: Every day | ORAL | 3 refills | Status: DC
Start: 2021-01-29 — End: 2022-01-08

## 2021-01-29 MED ORDER — LOSARTAN POTASSIUM 25 MG PO TABS: 25.0000 mg | ORAL_TABLET | Freq: Every day | ORAL | 3 refills | Status: DC

## 2021-01-29 MED ORDER — ROSUVASTATIN CALCIUM 20 MG PO TABS: 20.0000 mg | ORAL_TABLET | Freq: Every day | ORAL | 3 refills | Status: DC

## 2021-01-29 NOTE — Telephone Encounter (Signed)
These have been sent. Thank you.

## 2021-03-02 ENCOUNTER — Other Ambulatory Visit: Payer: Self-pay

## 2021-03-02 ENCOUNTER — Ambulatory Visit (INDEPENDENT_AMBULATORY_CARE_PROVIDER_SITE_OTHER): Payer: 59

## 2021-03-02 VITALS — BP 130/60 | HR 77 | Temp 98.0°F | Ht 69.0 in | Wt 247.8 lb

## 2021-03-02 DIAGNOSIS — F17200 Nicotine dependence, unspecified, uncomplicated: Secondary | ICD-10-CM

## 2021-03-02 DIAGNOSIS — R69 Illness, unspecified: Secondary | ICD-10-CM | POA: Diagnosis not present

## 2021-03-02 NOTE — Progress Notes (Signed)
Subjective:   Paul Johnston is a 71 y.o. male who presents for Medicare Annual/Subsequent preventive examination.  Review of Systems           Objective:    Today's Vitals   03/02/21 1447  BP: 130/60  Pulse: 77  Temp: 98 F (36.7 C)  TempSrc: Oral  SpO2: 96%  Weight: 247 lb 12.8 oz (112.4 kg)  Height: 5\' 9"  (1.753 m)   Body mass index is 36.59 kg/m.  Advanced Directives 10/27/2018 07/13/2012  Does Patient Have a Medical Advance Directive? No Patient does not have advance directive  Would patient like information on creating a medical advance directive? No - Patient declined -    Current Medications (verified) Outpatient Encounter Medications as of 03/02/2021  Medication Sig   Cholecalciferol (VITAMIN D3) 50 MCG (2000 UT) TABS Take 2,000 Units by mouth daily.   gabapentin (NEURONTIN) 100 MG capsule Start 1 capsule daily, increase by 1 cap every 2-3 days as tolerated up to 3 times a day, or may take 3 at once in evening. (Patient not taking: Reported on 12/25/2020)   Lancets (ONETOUCH ULTRASOFT) lancets Use as instructed   losartan (COZAAR) 25 MG tablet Take 1 tablet (25 mg total) by mouth daily.   metFORMIN (GLUCOPHAGE) 500 MG tablet Take 1 tablet (500 mg total) by mouth 2 (two) times daily with a meal.   neomycin-polymyxin b-dexamethasone (MAXITROL) 3.5-10000-0.1 SUSP SMARTSIG:In Eye(s)   omeprazole (PRILOSEC) 40 MG capsule Take 1 capsule (40 mg total) by mouth daily before breakfast.   ONETOUCH VERIO test strip Check blood sugar up to 2 times daily as instructed   rosuvastatin (CRESTOR) 20 MG tablet Take 1 tablet (20 mg total) by mouth daily.   valACYclovir (VALTREX) 1000 MG tablet Take 1 tablet (1,000 mg total) by mouth 3 (three) times daily. For shingles   No facility-administered encounter medications on file as of 03/02/2021.    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   HERNIA REPAIR  2014?   Umbilical hernia. Mesh installed.   INSERTION OF MESH N/A 07/16/2012   Procedure: INSERTION OF MESH;  Surgeon: Harl Bowie, MD;  Location: [facility];  Service: General;  Laterality: N/A;   ORBITAL FRACTURE SURGERY     UMBILICAL HERNIA REPAIR N/A 07/16/2012   Procedure: HERNIA REPAIR UMBILICAL WITH MESH;  Surgeon: Harl Bowie, MD;  Location: [facility];  Service: General;  Laterality: N/A;   UPPER GI ENDOSCOPY     Family History  Problem Relation Age of Onset   Emphysema Father        died age 56   Heart failure Father    COPD Father    Cancer Mother    Heart disease Paternal Grandfather    COPD Paternal Grandfather    Social History   Socioeconomic History   Marital status: Divorced    Spouse name: Not on file   Number  of children: 2   Years of education: Not on file   Highest education level: Not on file  Occupational History    Employer: [employer]  Tobacco Use   Smoking status: Former    Packs/day: 1.00    Years: 33.00    Pack years: 33.00    Types: Cigarettes    Quit date: 09/06/2008    Years since quitting: 12.4   Smokeless tobacco: Former  Scientific laboratory technician Use: Never used  Substance and Sexual Activity   Alcohol use: No   Drug use: No   Sexual activity: Not on file  Other Topics Concern   Not on file  Social History Narrative   Two natural children and three step.  Lives with wife.    Social Determinants of Health   Financial Resource Strain: Not on file  Food Insecurity: Not on file  Transportation  Needs: Not on file  Physical Activity: Not on file  Stress: Not on file  Social Connections: Not on file    Tobacco Counseling Counseling given: Not Answered   Clinical Intake:  Pre-visit preparation completed: Yes  Pain : No/denies pain     Nutritional Risks: None Diabetes: Yes CBG done?: No Did pt. bring in CBG monitor from home?: No  How often do you need to have someone help you when you read instructions, pamphlets, or other written materials from your doctor or pharmacy?: 1 - Never  Diabetic?yes Nutrition Risk Assessment:  Has the patient had any N/V/D within the last 2 months?  Yes  Does the patient have any non-healing wounds?  No  Has the patient had any unintentional weight loss or weight gain?  Yes   Diabetes:  Is the patient diabetic?  Yes  If diabetic, was a CBG obtained today?  No  Did the patient bring in their glucometer from home?  No  How often do you monitor your CBG's? rarely.   Financial Strains and Diabetes Management:  Are you having any financial strains with the device, your supplies or your medication? No .  Does the patient want to be seen by Chronic Care Management for management of their diabetes?  No  Would the patient like to be referred to a Nutritionist or for Diabetic Management?  No   Diabetic Exams:  Diabetic Eye Exam: Completed had exam in October- negative retinopathy.   Diabetic Foot Exam: Completed 10/12/20. Pt has been advised about the importance in completing this exam.   Interpreter Needed?: No  Information entered by :: Kirke Shaggy, LPN   Activities of Daily Living No flowsheet data found.  Patient Care Team: Olin Hauser, DO as PCP - General (Family Medicine)  Indicate any recent Medical Services you may have received from other than [facility] providers in the past year (date may be approximate).     Assessment:   This is a routine wellness examination for Paul Johnston.  Hearing/Vision screen No  results found.  Dietary issues and exercise activities discussed:     Goals Addressed   None    Depression Screen PHQ 2/9 Scores 11/01/2020 11/01/2019 05/28/2019 10/27/2018 09/15/2018 12/17/2017 06/11/2017  PHQ - 2 Score 1 0 0 0 0 4 0  PHQ- 9 Score 3 - - - - 8 -    Fall Risk Fall Risk  11/01/2020 03/03/2020 11/01/2019 05/28/2019 01/27/2019  Falls in the past year? 0 0 0 0 0  Number falls in past yr: 0 0 0 0 0  Injury with Fall? -  0 0 0 -  Follow up Falls evaluation completed - Falls evaluation completed Falls evaluation completed -    FALL RISK PREVENTION PERTAINING TO THE HOME:  Any stairs in or around the home? No  If so, are there any without handrails? No  Home free of loose throw rugs in walkways, pet beds, electrical cords, etc? Yes  Adequate lighting in your home to reduce risk of falls? Yes   ASSISTIVE DEVICES UTILIZED TO PREVENT FALLS:  Life alert? No  Use of a cane, walker or w/c? No  Grab bars in the bathroom? No  Shower chair or bench in shower? No  Elevated toilet seat or a handicapped toilet? No   TIMED UP AND GO:  Was the test performed? Yes .  Length of time to ambulate 10 feet: 4 sec.   Gait steady and fast without use of assistive device  Cognitive Function:     6CIT Screen 10/27/2018  What Year? 0 points  What month? 0 points  What time? 0 points  Count back from 20 0 points  Months in reverse 0 points  Repeat phrase 0 points  Total Score 0    Immunizations Immunization History  Administered Date(s) Administered   Fluad Quad(high Dose 65+) 12/08/2018, 12/09/2019, 11/28/2020   Influenza, High Dose Seasonal PF 12/12/2016, 12/17/2017   Influenza,inj,Quad PF,6+ Mos 11/18/2012, 12/21/2015   PFIZER Comirnaty(Gray Top)Covid-19 Tri-Sucrose Vaccine 06/29/2020   PFIZER(Purple Top)SARS-COV-2 Vaccination 03/20/2019, 04/10/2019, 11/18/2019   Pfizer Covid-19 Vaccine Bivalent Booster 22yrs & up 11/17/2020   Pneumococcal Conjugate-13 12/02/2012   Pneumococcal  Polysaccharide-23 06/11/2017   Td 11/20/2004, 12/21/2010   Tdap 12/21/2010    TDAP status: Due, Education has been provided regarding the importance of this vaccine. Advised may receive this vaccine at local pharmacy or Health Dept. Aware to provide a copy of the vaccination record if obtained from local pharmacy or Health Dept. Verbalized acceptance and understanding.  Flu Vaccine status: Up to date  Pneumococcal vaccine status: Up to date  Covid-19 vaccine status: Completed vaccines  Qualifies for Shingles Vaccine? Yes   Zostavax completed No   Shingrix Completed?: No.    Education has been provided regarding the importance of this vaccine. Patient has been advised to call insurance company to determine out of pocket expense if they have not yet received this vaccine. Advised may also receive vaccine at local pharmacy or Health Dept. Verbalized acceptance and understanding.  Screening Tests Health Maintenance  Topic Date Due   Zoster Vaccines- Shingrix (1 of 2) Never done   OPHTHALMOLOGY EXAM  11/16/2020   TETANUS/TDAP  12/20/2020   HEMOGLOBIN A1C  04/24/2021   FOOT EXAM  11/01/2021   COLONOSCOPY (Pts 45-58yrs Insurance coverage will need to be confirmed)  03/15/2025   Pneumonia Vaccine 26+ Years old  Completed   INFLUENZA VACCINE  Completed   COVID-19 Vaccine  Completed   Hepatitis C Screening  Completed   HPV VACCINES  Aged Out    Health Maintenance  Health Maintenance Due  Topic Date Due   Zoster Vaccines- Shingrix (1 of 2) Never done   OPHTHALMOLOGY EXAM  11/16/2020   TETANUS/TDAP  12/20/2020    Colorectal cancer screening: Type of screening: Colonoscopy. Completed 03/16/15. Repeat every 10 years  Lung Cancer Screening: (Low Dose CT Chest recommended if Age 23-80 years, 30 pack-year currently smoking OR have quit w/in 15years.) does not qualify.    Additional Screening:  Hepatitis C Screening: does qualify; Completed 01/11/15  Vision Screening: Recommended  annual  ophthalmology exams for early detection of glaucoma and other disorders of the eye. Is the patient up to date with their annual eye exam?  Yes  Who is the provider or what is the name of the office in which the patient attends annual eye exams? Dr.Woodard If pt is not established with a provider, would they like to be referred to a provider to establish care? No .   Dental Screening: Recommended annual dental exams for proper oral hygiene  Community Resource Referral / Chronic Care Management: CRR required this visit?  No   CCM required this visit?  No      Plan:     I have personally reviewed and noted the following in the patients chart:   Medical and social history Use of alcohol, tobacco or illicit drugs  Current medications and supplements including opioid prescriptions. Patient is not currently taking opioid prescriptions. Functional ability and status Nutritional status Physical activity Advanced directives List of other physicians Hospitalizations, surgeries, and ER visits in previous 12 months Vitals Screenings to include cognitive, depression, and falls Referrals and appointments  In addition, I have reviewed and discussed with patient certain preventive protocols, quality metrics, and best practice recommendations. A written personalized care plan for preventive services as well as general preventive health recommendations were provided to patient.     Dionisio David, LPN   [id number]   Nurse Notes: none

## 2021-03-02 NOTE — Patient Instructions (Signed)
[name] , Thank you for taking time to come for your Medicare Wellness Visit. I appreciate your ongoing commitment to your health goals. Please review the following plan we discussed and let me know if I can assist you in the future.   Screening recommendations/referrals: Colonoscopy: 03/16/15 Recommended yearly ophthalmology/optometry visit for glaucoma screening and checkup Recommended yearly dental visit for hygiene and checkup  Vaccinations: Influenza vaccine: 11/28/20 Pneumococcal vaccine: 06/11/17 Tdap vaccine: 12/21/10 Shingles vaccine: n/d   Covid-19: 03/20/19, 04/10/19, 11/18/19, 06/29/20  Advanced directives: no  Conditions/risks identified: none  Next appointment: Follow up in one year for your annual wellness visit. 03/08/22 @ 2pm in person  Preventive Care 30 Years and Older, Male Preventive care refers to lifestyle choices and visits with your health care provider that can promote health and wellness. What does preventive care include? A yearly physical exam. This is also called an annual well check. Dental exams once or twice a year. Routine eye exams. Ask your health care provider how often you should have your eyes checked. Personal lifestyle choices, including: Daily care of your teeth and gums. Regular physical activity. Eating a healthy diet. Avoiding tobacco and drug use. Limiting alcohol use. Practicing safe sex. Taking low doses of aspirin every day. Taking vitamin and mineral supplements as recommended by your health care provider. What happens during an annual well check? The services and screenings done by your health care provider during your annual well check will depend on your age, overall health, lifestyle risk factors, and family history of disease. Counseling  Your health care provider may ask you questions about your: Alcohol use. Tobacco use. Drug use. Emotional well-being. Home and relationship well-being. Sexual activity. Eating  habits. History of falls. Memory and ability to understand (cognition). Work and work Statistician. Screening  You may have the following tests or measurements: Height, weight, and BMI. Blood pressure. Lipid and cholesterol levels. These may be checked every 5 years, or more frequently if you are over 36 years old. Skin check. Lung cancer screening. You may have this screening every year starting at age 6 if you have a 30-pack-year history of smoking and currently smoke or have quit within the past 15 years. Fecal occult blood test (FOBT) of the stool. You may have this test every year starting at age 63. Flexible sigmoidoscopy or colonoscopy. You may have a sigmoidoscopy every 5 years or a colonoscopy every 10 years starting at age 59. Prostate cancer screening. Recommendations will vary depending on your family history and other risks. Hepatitis C blood test. Hepatitis B blood test. Sexually transmitted disease (STD) testing. Diabetes screening. This is done by checking your blood sugar (glucose) after you have not eaten for a while (fasting). You may have this done every 1-3 years. Abdominal aortic aneurysm (AAA) screening. You may need this if you are a current or former smoker. Osteoporosis. You may be screened starting at age 7 if you are at high risk. Talk with your health care provider about your test results, treatment options, and if necessary, the need for more tests. Vaccines  Your health care provider may recommend certain vaccines, such as: Influenza vaccine. This is recommended every year. Tetanus, diphtheria, and acellular pertussis (Tdap, Td) vaccine. You may need a Td booster every 10 years. Zoster vaccine. You may need this after age 29. Pneumococcal 13-valent conjugate (PCV13) vaccine. One dose is recommended after age 59. Pneumococcal polysaccharide (PPSV23) vaccine. One dose is recommended after age 42. Talk to your health care  provider about which screenings and  vaccines you need and how often you need them. This information is not intended to replace advice given to you by your health care provider. Make sure you discuss any questions you have with your health care provider. Document Released: 02/24/2015 Document Revised: 10/18/2015 Document Reviewed: 11/29/2014 Elsevier Interactive Patient Education  2017 [employer] Prevention in the Home Falls can cause injuries. They can happen to people of all ages. There are many things you can do to make your home safe and to help prevent falls. What can I do on the outside of my home? Regularly fix the edges of walkways and driveways and fix any cracks. Remove anything that might make you trip as you walk through a door, such as a raised step or threshold. Trim any bushes or trees on the path to your home. Use bright outdoor lighting. Clear any walking paths of anything that might make someone trip, such as rocks or tools. Regularly check to see if handrails are loose or broken. Make sure that both sides of any steps have handrails. Any raised decks and porches should have guardrails on the edges. Have any leaves, snow, or ice cleared regularly. Use sand or salt on walking paths during winter. Clean up any spills in your garage right away. This includes oil or grease spills. What can I do in the bathroom? Use night lights. Install grab bars by the toilet and in the tub and shower. Do not use towel bars as grab bars. Use non-skid mats or decals in the tub or shower. If you need to sit down in the shower, use a plastic, non-slip stool. Keep the floor dry. Clean up any water that spills on the floor as soon as it happens. Remove soap buildup in the tub or shower regularly. Attach bath mats securely with double-sided non-slip rug tape. Do not have throw rugs and other things on the floor that can make you trip. What can I do in the bedroom? Use night lights. Make sure that you have a light by your  bed that is easy to reach. Do not use any sheets or blankets that are too big for your bed. They should not hang down onto the floor. Have a firm chair that has side arms. You can use this for support while you get dressed. Do not have throw rugs and other things on the floor that can make you trip. What can I do in the kitchen? Clean up any spills right away. Avoid walking on wet floors. Keep items that you use a lot in easy-to-reach places. If you need to reach something above you, use a strong step stool that has a grab bar. Keep electrical cords out of the way. Do not use floor polish or wax that makes floors slippery. If you must use wax, use non-skid floor wax. Do not have throw rugs and other things on the floor that can make you trip. What can I do with my stairs? Do not leave any items on the stairs. Make sure that there are handrails on both sides of the stairs and use them. Fix handrails that are broken or loose. Make sure that handrails are as long as the stairways. Check any carpeting to make sure that it is firmly attached to the stairs. Fix any carpet that is loose or worn. Avoid having throw rugs at the top or bottom of the stairs. If you do have throw rugs, attach them to the  floor with carpet tape. Make sure that you have a light switch at the top of the stairs and the bottom of the stairs. If you do not have them, ask someone to add them for you. What else can I do to help prevent falls? Wear shoes that: Do not have high heels. Have rubber bottoms. Are comfortable and fit you well. Are closed at the toe. Do not wear sandals. If you use a stepladder: Make sure that it is fully opened. Do not climb a closed stepladder. Make sure that both sides of the stepladder are locked into place. Ask someone to hold it for you, if possible. Clearly mark and make sure that you can see: Any grab bars or handrails. First and last steps. Where the edge of each step is. Use tools that  help you move around (mobility aids) if they are needed. These include: Canes. Walkers. Scooters. Crutches. Turn on the lights when you go into a dark area. Replace any light bulbs as soon as they burn out. Set up your furniture so you have a clear path. Avoid moving your furniture around. If any of your floors are uneven, fix them. If there are any pets around you, be aware of where they are. Review your medicines with your doctor. Some medicines can make you feel dizzy. This can increase your chance of falling. Ask your doctor what other things that you can do to help prevent falls. This information is not intended to replace advice given to you by your health care provider. Make sure you discuss any questions you have with your health care provider. Document Released: 11/24/2008 Document Revised: 07/06/2015 Document Reviewed: 03/04/2014 Elsevier Interactive Patient Education  2017 [employer].

## 2021-04-26 ENCOUNTER — Other Ambulatory Visit: Payer: Self-pay | Admitting: *Deleted

## 2021-05-02 ENCOUNTER — Ambulatory Visit: Payer: 59 | Admitting: Family Medicine

## 2021-05-09 ENCOUNTER — Other Ambulatory Visit: Payer: Self-pay | Admitting: Family Medicine

## 2021-05-09 ENCOUNTER — Encounter: Payer: Self-pay | Admitting: Family Medicine

## 2021-05-09 ENCOUNTER — Ambulatory Visit (INDEPENDENT_AMBULATORY_CARE_PROVIDER_SITE_OTHER): Payer: 59 | Admitting: Family Medicine

## 2021-05-09 VITALS — BP 134/66 | HR 85 | Temp 97.5°F | Wt 243.0 lb

## 2021-05-09 DIAGNOSIS — E1169 Type 2 diabetes mellitus with other specified complication: Secondary | ICD-10-CM | POA: Diagnosis not present

## 2021-05-09 DIAGNOSIS — N400 Enlarged prostate without lower urinary tract symptoms: Secondary | ICD-10-CM

## 2021-05-09 DIAGNOSIS — Z0000 Encounter for general adult medical examination without abnormal findings: Secondary | ICD-10-CM

## 2021-05-09 DIAGNOSIS — E559 Vitamin D deficiency, unspecified: Secondary | ICD-10-CM

## 2021-05-09 NOTE — Assessment & Plan Note (Signed)
Well-controlled DM with A1c 6.5 previously,. Need new POC A1c - out of stock today return in 1 week ? ?No known complications or hypoglycemia. ?Complications - hyperlipidemia, obesity, GERD - increases risk of future cardiovascular complications  ?On ARB ?- Had mild elevated BP but home readings all normal. ? ?Plan:  ?1. Continue current therapy - Metformin '500mg'$  IR will keep daily for now - BID upset stomach ?- Future consider new DM medications if indicated ?2. Encourage improved lifestyle - low carb, low sugar diet, reduce portion size, continue improving regular exercise ?3. Check CBG , bring log to next visit for review ?4. Continue Statin, ARB low dose ?

## 2021-05-09 NOTE — Patient Instructions (Addendum)
Thank you for coming to the office today. ? ?Return 1 week approx for Fingerstick A1c test - our nursing staff should be able to call you in 1-2 weeks. If you don't hear back in 2 weeks, message me. ? ?Recommend TDap and Shingrix vaccine (1st of 2 shots, repeat dose shingrix 2-6 months later) ? ?Urine testing today for protein / general check ? ?Recommend Cologuard next year in 2024 - if negative good for 3 years, we can decide again in 2027 ? ?Colon Cancer Screening: ?- For all adults age 38+ routine colon cancer screening is highly recommended. ?    - Recent guidelines from [place] recommend starting age of 36 ?- Early detection of colon cancer is important, because often there are no warning signs or symptoms, also if found early usually it can be cured. Late stage is hard to treat. ? ?- If you are not interested in Colonoscopy screening (if done and normal you could be cleared for 5 to 10 years until next due), then Cologuard is an excellent alternative for screening test for Colon Cancer. It is highly sensitive for detecting DNA of colon cancer from even the earliest stages. Also, there is NO bowel prep required. ?- If Cologuard is NEGATIVE, then it is good for 3 years before next due ?- If Cologuard is POSITIVE, then it is strongly advised to get a Colonoscopy, which allows the GI doctor to locate the source of the cancer or polyp (even very early stage) and treat it by removing it. ?------------------------- ?If you would like to proceed with Cologuard (stool DNA test) ?- FIRST, call your insurance company and tell them you want to check cost of Cologuard tell them CPT Code [phone] (it may be completely covered and you could get for no cost, OR max cost without any coverage is about $600). Also, keep in mind if you do NOT open the kit, and decide not to do the test, you will NOT be charged, you should contact the company if you decide not to do the test. ?- If you want to proceed, you can notify  us (phone message, [place], or at next visit) and we will order it for you. The test kit will be delivered to you house within about 1 week. Follow instructions to collect sample, you may call the company for any help or questions, 24/7 telephone support at [phone]. ? ?DUE for FASTING BLOOD WORK (no food or drink after midnight before the lab appointment, only water or coffee without cream/sugar on the morning of) ? ?SCHEDULE "Lab Only" visit in the morning at the clinic for lab draw in  6 MONTHS  ? ?- Make sure Lab Only appointment is at about 1 week before your next appointment, so that results will be available ? ?For Lab Results, once available within 2-3 days of blood draw, you can can log in to MyChart online to view your results and a brief explanation. Also, we can discuss results at next follow-up visit. ? ? ?Please schedule a Follow-up Appointment to: Return in about 6 months (around 11/09/2021) for 6 month fasting lab only then 1 week later Annual Physical. ? ?If you have any other questions or concerns, please feel free to call the office or send a message through [place]. You may also schedule an earlier appointment if necessary. ? ?Additionally, you may be receiving a survey about your experience at our office within a few days to 1 week by e-mail or mail. We  value your feedback. ? ?[name], DO ?[facility] ?

## 2021-05-09 NOTE — Progress Notes (Signed)
? ?Subjective:  ? ? Patient ID: Paul Johnston, male    DOB: 1950/05/04, 71 y.o.   MRN: [id number] ? ?Paul Johnston is a 71 y.o. male presenting on 05/09/2021 for Diabetes ? ? ?HPI ? ?CHRONIC DM, Type 2 ?Morbid Obestiy BMI >35 ?Last A1c lab up to 6.5 but stable ?CBGs: Infrequent checks ?Meds: Metformin 542m once daily ?Reports good compliance. Tolerating well w/o side-effects ?Currently not on ACEi/ARB ?Lifestyle: ?Weight down 5 lbs in last 5 months ?- Diet (improving low carb DM diet)  ?- Exercise: limited ?- Dr WEllin MayhewDM Eye 11/22/20 ?Denies hypoglycemia, polyuria, visual changes, numbness or tingling. ?  ?Elevated BP ?Previous elevated on electronic cuff but improved on manual ?Home BP readings normal range. 120-130 / 60-70s ?Current Meds - Losartan 241mdaily ?Denies CP, dyspnea, HA, edema, dizziness / lightheadedness ?  ?History of Abnormal X-ray - nodular density identified RUL stable from 2014 to 2016, last x-ray 2018. ? ?Upcoming LDCT Screening ordered already. ?  ?Health Maintenance: ?  ?History of Shingles in past. He is considering Shingrix. ?  ?UTD Pfizer COVID19 vaccine Due for booster bivalent when ready ?  ?TDap due, will go to pharmacy ?  ?Last Colon CA Screening Colonoscopy 2017 Dr PaCarol Adanegative no specimens. Next due 10 year 2027. He prefers to do some testing inbetween. With cologuard, will check for 2024 ? ? ? ?  03/02/2021  ?  2:54 PM 11/01/2020  ?  2:15 PM 11/01/2019  ?  2:13 PM  ?Depression screen PHQ 2/9  ?Decreased Interest 1 1 0  ?Down, Depressed, Hopeless 0 0 0  ?PHQ - 2 Score 1 1 0  ?Altered sleeping  1   ?Tired, decreased energy  1   ?Change in appetite  0   ?Feeling bad or failure about yourself   0   ?Trouble concentrating  0   ?Moving slowly or fidgety/restless  0   ?Suicidal thoughts  0   ?PHQ-9 Score  3   ?Difficult doing work/chores  Not difficult at all   ? ? ?Social History  ? ?Tobacco Use  ? Smoking status: Former  ?  Packs/day: 1.00  ?  Years: 33.00  ?  Pack years: 33.00  ?   Types: Cigarettes  ?  Quit date: 09/06/2008  ?  Years since quitting: 12.6  ? Smokeless tobacco: Former  ?Vaping Use  ? Vaping Use: Never used  ?Substance Use Topics  ? Alcohol use: No  ? Drug use: No  ? ? ?Review of Systems ?Per HPI unless specifically indicated above ? ?   ?Objective:  ?  ?BP 134/66 (BP Location: Left Arm, Patient Position: Sitting, Cuff Size: Large)   Pulse 85   Temp (!) 97.5 ?F (36.4 ?C) (Temporal)   Wt 243 lb (110.2 kg)   SpO2 95%   BMI 35.88 kg/m?   ?Wt Readings from Last 3 Encounters:  ?05/09/21 243 lb (110.2 kg)  ?03/02/21 247 lb 12.8 oz (112.4 kg)  ?12/25/20 248 lb 9.6 oz (112.8 kg)  ?  ?Physical Exam ?Vitals and nursing note reviewed.  ?Constitutional:   ?   General: He is not in acute distress. ?   Appearance: Normal appearance. He is well-developed. He is not diaphoretic.  ?   Comments: Well-appearing, comfortable, cooperative  ?HENT:  ?   Head: Normocephalic and atraumatic.  ?Eyes:  ?   General:     ?   Right eye: No discharge.     ?   Left eye:  No discharge.  ?   Conjunctiva/sclera: Conjunctivae normal.  ?Cardiovascular:  ?   Rate and Rhythm: Normal rate.  ?Pulmonary:  ?   Effort: Pulmonary effort is normal.  ?Skin: ?   General: Skin is warm and dry.  ?   Findings: No erythema or rash.  ?Neurological:  ?   Mental Status: He is alert and oriented to person, place, and time.  ?Psychiatric:     ?   Mood and Affect: Mood normal.     ?   Behavior: Behavior normal.     ?   Thought Content: Thought content normal.  ?   Comments: Well groomed, good eye contact, normal speech and thoughts  ? ? ? ? ?Results for orders placed or performed in visit on 10/17/20  ?PSA  ?Result Value Ref Range  ? PSA 0.54 < OR = 4.00 ng/mL  ?Lipid panel  ?Result Value Ref Range  ? Cholesterol 98 <200 mg/dL  ? HDL 36 (L) > OR = 40 mg/dL  ? Triglycerides 206 (H) <150 mg/dL  ? LDL Cholesterol (Calc) 35 mg/dL (calc)  ? Total CHOL/HDL Ratio 2.7 <5.0 (calc)  ? Non-HDL Cholesterol (Calc) 62 <130 mg/dL (calc)  ?COMPLETE  METABOLIC PANEL WITH GFR  ?Result Value Ref Range  ? Glucose, Bld 121 (H) 65 - 99 mg/dL  ? BUN 17 7 - 25 mg/dL  ? Creat 0.90 0.70 - 1.35 mg/dL  ? eGFR 93 > OR = 60 mL/min/1.72m  ? BUN/Creatinine Ratio NOT APPLICABLE 6 - 22 (calc)  ? Sodium 141 135 - 146 mmol/L  ? Potassium 4.4 3.5 - 5.3 mmol/L  ? Chloride 104 98 - 110 mmol/L  ? CO2 29 20 - 32 mmol/L  ? Calcium 9.3 8.6 - 10.3 mg/dL  ? Total Protein 6.8 6.1 - 8.1 g/dL  ? Albumin 4.3 3.6 - 5.1 g/dL  ? Globulin 2.5 1.9 - 3.7 g/dL (calc)  ? AG Ratio 1.7 1.0 - 2.5 (calc)  ? Total Bilirubin 0.6 0.2 - 1.2 mg/dL  ? Alkaline phosphatase (APISO) 35 35 - 144 U/L  ? AST 21 10 - 35 U/L  ? ALT 16 9 - 46 U/L  ?CBC with Differential/Platelet  ?Result Value Ref Range  ? WBC 8.7 3.8 - 10.8 Thousand/uL  ? RBC 5.20 4.20 - 5.80 Million/uL  ? Hemoglobin 14.7 13.2 - 17.1 g/dL  ? HCT 45.6 38.5 - 50.0 %  ? MCV 87.7 80.0 - 100.0 fL  ? MCH 28.3 27.0 - 33.0 pg  ? MCHC 32.2 32.0 - 36.0 g/dL  ? RDW 13.2 11.0 - 15.0 %  ? Platelets 167 140 - 400 Thousand/uL  ? MPV 10.8 7.5 - 12.5 fL  ? Neutro Abs 5,507 1,500 - 7,800 cells/uL  ? Lymphs Abs 2,140 850 - 3,900 cells/uL  ? Absolute Monocytes 609 200 - 950 cells/uL  ? Eosinophils Absolute 374 15 - 500 cells/uL  ? Basophils Absolute 70 0 - 200 cells/uL  ? Neutrophils Relative % 63.3 %  ? Total Lymphocyte 24.6 %  ? Monocytes Relative 7.0 %  ? Eosinophils Relative 4.3 %  ? Basophils Relative 0.8 %  ?Hemoglobin A1c  ?Result Value Ref Range  ? Hgb A1c MFr Bld 6.5 (H) <5.7 % of total Hgb  ? Mean Plasma Glucose 140 mg/dL  ? eAG (mmol/L) 7.7 mmol/L  ?VITAMIN D 25 Hydroxy (Vit-D Deficiency, Fractures)  ?Result Value Ref Range  ? Vit D, 25-Hydroxy 59 30 - 100 ng/mL  ? ?   ?  Assessment & Plan:  ? ?Problem List Items Addressed This Visit   ? ? Type 2 diabetes mellitus with other specified complication ([place]) - Primary  ?  Well-controlled DM with A1c 6.5 previously,. Need new POC A1c - out of stock today return in 1 week ? ?No known complications or  hypoglycemia. ?Complications - hyperlipidemia, obesity, GERD - increases risk of future cardiovascular complications  ?On ARB ?- Had mild elevated BP but home readings all normal. ? ?Plan:  ?1. Continue current therapy - Metformin 535m IR will keep daily for now - BID upset stomach ?- Future consider new DM medications if indicated ?2. Encourage improved lifestyle - low carb, low sugar diet, reduce portion size, continue improving regular exercise ?3. Check CBG , bring log to next visit for review ?4. Continue Statin, ARB low dose ?  ?  ? Relevant Orders  ? Urinalysis, Routine w reflex microscopic  ? Urine Microalbumin w/creat. ratio  ? Morbid obesity ([place]asis  ? ?BP is controlled on manual BP reading today. ?No indication for medication  ? ?Return about 1 week or so for nurse visit for A1c fingerstick (when back in stock) ? ?Orders Placed This Encounter  ?Procedures  ? Urinalysis, Routine w reflex microscopic  ? Urine Microalbumin w/creat. ratio  ? ? ? ?No orders of the defined types were placed in this encounter. ? ? ? ? ?Follow up plan: ?Return in about 6 months (around 11/09/2021) for 6 month fasting lab only then 1 week later Annual Physical. ? ?Future labs ordered for  CMET LIPID A1c CBC VIT D PSA ? ?Future consider B12 / Testosterone if not improved ? ? ?ANobie Putnam DO ?[facility] ?05/09/2021, 2:16 PM ?

## 2021-05-10 ENCOUNTER — Encounter: Payer: Self-pay | Admitting: Family Medicine

## 2021-05-10 LAB — URINALYSIS, ROUTINE W REFLEX MICROSCOPIC
Bacteria, UA: NONE SEEN /HPF
Bilirubin Urine: NEGATIVE
Glucose, UA: NEGATIVE
Hgb urine dipstick: NEGATIVE
Hyaline Cast: NONE SEEN /LPF
Ketones, ur: NEGATIVE
Leukocytes,Ua: NEGATIVE
Nitrite: NEGATIVE
RBC / HPF: NONE SEEN /HPF (ref 0–2)
Specific Gravity, Urine: 1.021 (ref 1.001–1.035)
Squamous Epithelial / HPF: NONE SEEN /HPF (ref <=5)
WBC, UA: NONE SEEN /HPF (ref 0–5)
pH: 6.5 (ref 5.0–8.0)

## 2021-05-10 LAB — MICROALBUMIN / CREATININE URINE RATIO
Creatinine, Urine: 148 mg/dL (ref 20–320)
Microalb Creat Ratio: 24 mcg/mg creat (ref <30)
Microalb, Ur: 3.6 mg/dL

## 2021-05-11 ENCOUNTER — Ambulatory Visit (INDEPENDENT_AMBULATORY_CARE_PROVIDER_SITE_OTHER): Payer: Medicare HMO | Admitting: Acute Care

## 2021-05-11 ENCOUNTER — Encounter: Payer: Self-pay | Admitting: Acute Care

## 2021-05-11 DIAGNOSIS — Z87891 Personal history of nicotine dependence: Secondary | ICD-10-CM | POA: Diagnosis not present

## 2021-05-11 NOTE — Patient Instructions (Signed)
Thank you for participating in the [facility] Screening [facility]. It was our pleasure to meet you today. We will call you with the results of your scan within the next few days. Your scan will be assigned a Lung RADS category score by the physicians reading the scans.  This Lung RADS score determines follow up scanning.  See below for description of categories, and follow up screening recommendations. We will be in touch to schedule your follow up screening annually or based on recommendations of our providers. We will fax a copy of your scan results to your Primary Care Physician, or the physician who referred you to the program, to ensure they have the results. Please call the office if you have any questions or concerns regarding your scanning experience or results.  Our office number is [phone]. Please speak with Denise Phelps, RN. , or  Denise Buckner RN, They are  our Lung Cancer Screening RN.'s If They are unavailable when you call, Please leave a message on the voice mail. We will return your call at our earliest convenience.This voice mail is monitored several times a day.  Remember, if your scan is normal, we will scan you annually as long as you continue to meet the criteria for the program. (Age 55-77, Current smoker or smoker who has quit within the last 15 years). If you are a smoker, remember, quitting is the single most powerful action that you can take to decrease your risk of lung cancer and other pulmonary, breathing related problems. We know quitting is hard, and we are here to help.  Please let us know if there is anything we can do to help you meet your goal of quitting. If you are a former smoker, congratulations. We are proud of you! Remain smoke free! Remember you can refer friends or family members through the number above.  We will screen them to make sure they meet criteria for the program. Thank you for helping us take better care of you by  participating in Lung Screening.  You can receive free nicotine replacement therapy ( patches, gum or mints) by calling 1-800-QUIT NOW. Please call so we can get you on the path to becoming  a non-smoker. I know it is hard, but you can do this!  Lung RADS Categories:  Lung RADS 1: no nodules or definitely non-concerning nodules.  Recommendation is for a repeat annual scan in 12 months.  Lung RADS 2:  nodules that are non-concerning in appearance and behavior with a very low likelihood of becoming an active cancer. Recommendation is for a repeat annual scan in 12 months.  Lung RADS 3: nodules that are probably non-concerning , includes nodules with a low likelihood of becoming an active cancer.  Recommendation is for a 6-month repeat screening scan. Often noted after an upper respiratory illness. We will be in touch to make sure you have no questions, and to schedule your 6-month scan.  Lung RADS 4 A: nodules with concerning findings, recommendation is most often for a follow up scan in 3 months or additional testing based on our provider's assessment of the scan. We will be in touch to make sure you have no questions and to schedule the recommended 3 month follow up scan.  Lung RADS 4 B:  indicates findings that are concerning. We will be in touch with you to schedule additional diagnostic testing based on our provider's  assessment of the scan.  Other options for assistance in smoking cessation (   As covered by your insurance benefits)  Hypnosis for smoking cessation  [employer] [phone]  Acupuncture for smoking cessation  [facility] [phone]   

## 2021-05-11 NOTE — Progress Notes (Signed)
Virtual Visit via Telephone Note ? ?I connected with Paul Johnston on 12/26/20 at  2:00 PM EST by telephone and verified that I am speaking with the correct person using two identifiers. ? ?Location: ?Patient: Home ?Provider: Working from home ?  ?I discussed the limitations, risks, security and privacy concerns of performing an evaluation and management service by telephone and the availability of in person appointments. I also discussed with the patient that there may be a patient responsible charge related to this service. The patient expressed understanding and agreed to proceed. ? ?Shared Decision Making Visit Lung Cancer Screening Program ?([phone]) ? ? ?Eligibility: ?Age 71 y.o. ?Pack Years Smoking History Calculation 38 ?(# packs/per year x # years smoked) ?Recent History of coughing up blood  no ?Unexplained weight loss? no ?( >Than 15 pounds within the last 6 months ) ?Prior History Lung / other cancer no ?(Diagnosis within the last 5 years already requiring surveillance chest CT Scans). ?Smoking Status Former Smoker ?Former Smokers: Years since quit: 13 years ? Quit Date: 08/2008 ? ?Visit Components: ?Discussion included one or more decision making aids. yes ?Discussion included risk/benefits of screening. yes ?Discussion included potential follow up diagnostic testing for abnormal scans. yes ?Discussion included meaning and risk of over diagnosis. yes ?Discussion included meaning and risk of False Positives. yes ?Discussion included meaning of total radiation exposure. yes ? ?Counseling Included: ?Importance of adherence to annual lung cancer LDCT screening. yes ?Impact of comorbidities on ability to participate in the program. yes ?Ability and willingness to under diagnostic treatment. yes ? ?Smoking Cessation Counseling: ?Current Smokers:  ?Discussed importance of smoking cessation. yes ?Information about tobacco cessation classes and interventions provided to patient. yes ?Patient provided with "ticket"  for LDCT Scan. yes ?Symptomatic Patient. no ? Counseling NA ?Diagnosis Code: Tobacco Use Z72.0 ?Asymptomatic Patient yes ? Counseling NA ?Former Smokers:  ?Discussed the importance of maintaining cigarette abstinence. yes ?Diagnosis Code: Personal History of Nicotine Dependence. E56.314 ?Information about tobacco cessation classes and interventions provided to patient. Yes ?Patient provided with "ticket" for LDCT Scan. yes ?Written Order for Lung Cancer Screening with LDCT placed in Epic. Yes ?(CT Chest Lung Cancer Screening Low Dose W/O CM) [id number] ?Z12.2-Screening of respiratory organs ?Z87.891-Personal history of nicotine dependence ? ? ?I spent 25 minutes of face to face time with Paul Johnston discussing the risks and benefits of lung cancer screening. We viewed a power point together that explained in detail the above noted topics. We took the time to pause the power point at intervals to allow for questions to be asked and answered to ensure understanding. We discussed that he had taken the single most powerful action possible to decrease his risk of developing lung cancer when he quit smoking. I counseled Paul Johnston to remain smoke free, and to contact me if he ever had the desire to smoke again so that I can provide resources and tools to help support the effort to remain smoke free. We discussed the time and location of the scan, and that either  Doroteo Glassman RN or I will call with the results within  24-48 hours of receiving them. He has my card and contact information in the event he needs to speak with me, in addition to a copy of the power point we reviewed as a resource. He verbalized understanding of all of the above and had no further questions upon leaving the office.  ? ? ? ?I explained to the patient that there has been a high incidence of coronary  artery disease noted on these exams. I explained that this is a non-gated exam therefore degree or severity cannot be determined. This patient is on statin therapy. I  have asked the patient to follow-up with their PCP regarding any incidental finding of coronary artery disease and management with diet or medication as they feel is clinically indicated. The patient verbalized understanding of the above and had no further questions. ? ? ? ?Magdelyn Roebuck D. Harris, NP-C ?[facility] ?Personal contact information can be found on Amion  ?05/11/2021, 11:44 AM ? ? ? ? ? ? ? ? ? ?

## 2021-05-14 ENCOUNTER — Ambulatory Visit
Admission: RE | Admit: 2021-05-14 | Discharge: 2021-05-14 | Disposition: A | Discharge status: Home / Self Care | Payer: Medicare HMO | Source: Ambulatory Visit | Attending: Acute Care | Admitting: Acute Care

## 2021-05-14 DIAGNOSIS — Z87891 Personal history of nicotine dependence: Secondary | ICD-10-CM | POA: Diagnosis not present

## 2021-05-15 ENCOUNTER — Encounter: Payer: Self-pay | Admitting: Acute Care

## 2021-05-23 ENCOUNTER — Encounter: Payer: Self-pay | Admitting: Family Medicine

## 2021-05-29 ENCOUNTER — Encounter: Payer: Self-pay | Admitting: Family Medicine

## 2021-06-05 ENCOUNTER — Telehealth: Payer: Self-pay | Admitting: Acute Care

## 2021-06-05 ENCOUNTER — Encounter: Payer: Self-pay | Admitting: *Deleted

## 2021-06-05 ENCOUNTER — Other Ambulatory Visit: Payer: Self-pay | Admitting: Acute Care

## 2021-06-05 ENCOUNTER — Other Ambulatory Visit: Payer: Self-pay | Admitting: *Deleted

## 2021-06-05 DIAGNOSIS — Z122 Encounter for screening for malignant neoplasm of respiratory organs: Secondary | ICD-10-CM

## 2021-06-05 DIAGNOSIS — J849 Interstitial pulmonary disease, unspecified: Secondary | ICD-10-CM

## 2021-06-05 DIAGNOSIS — Z87891 Personal history of nicotine dependence: Secondary | ICD-10-CM

## 2021-06-05 NOTE — Telephone Encounter (Signed)
Order placed for 6 mth nodule f/u CT scan.  ?

## 2021-06-05 NOTE — Telephone Encounter (Signed)
The patient has been called with the results of his low dose Ct Chest. I explained his scan was read as a Lung  RADS 3, nodules that are probably benign findings, short term follow up suggested: includes nodules with a low likelihood of becoming a clinically active cancer. Radiology recommends a 6 month repeat LDCT follow up. He is in agrement with this plan.  ?Langley Gauss, please place order for 6 month follow up low dose CT.  ?There was notation of ILD on his scan. He has noticed more shortness of breath lately. He is in agreement with pulmonary consult to evaluate and monitor for progression . I will place referral.  ?He needs to follow up with PCP about CAD notation. He is on statin and states his cholesterol is well controlled, but has not had cards work up in 10 years. I have copies his PCP on the results.   ?

## 2021-06-06 ENCOUNTER — Ambulatory Visit: Payer: Medicare HMO | Admitting: Acute Care

## 2021-07-03 ENCOUNTER — Encounter: Payer: Self-pay | Admitting: Pulmonary Disease

## 2021-07-03 ENCOUNTER — Ambulatory Visit: Payer: Medicare HMO | Admitting: Pulmonary Disease

## 2021-07-03 VITALS — BP 134/60 | HR 83 | Temp 98.1°F | Ht 69.0 in | Wt 246.4 lb

## 2021-07-03 DIAGNOSIS — J849 Interstitial pulmonary disease, unspecified: Secondary | ICD-10-CM | POA: Diagnosis not present

## 2021-07-03 NOTE — Patient Instructions (Signed)
Will get some labs for further evaluation of the scarring in the lungs schedule had a CT and PFTs return to clinic in one to two months for review and further evaluation

## 2021-07-03 NOTE — Progress Notes (Signed)
Paul Johnston    [id number]    Jan 30, 1951  Primary Care Physician:[name], Devonne Doughty, DO  Referring Physician: Olin Hauser, DO [address] [place],  [place] [address]  Chief complaint: Consult interstitial lung disease  HPI: 71 year old with history of hypertension, diabetes, hyperlipidemia, sleep apnea. Previously seen by Dr. Annamaria Boots in 2018 for OSA,  AHA 14.7. CPAP suggested but he never got started on it  He recently had a low-dose screening CT that showed prominent pulmonary fibrosis and has been referred here for further evaluation States that he has mild dyspnea on exertion, no cough, congestion  Pets: dogs Occupation: used to work as an Customer service manager.  Exposures:Previously exposed to cotton dust and metal grinding ILD questionnaire 07/03/21 - as above Smoking history: 37 pack year smoker. Quit in 2010 Travel history: no significant travel history Relevant family history: father and grandfather had emphysema. There were smokers.   Outpatient Encounter Medications as of 07/03/2021  Medication Sig   Cholecalciferol (VITAMIN D3) 50 MCG (2000 UT) TABS Take 2,000 Units by mouth daily.   Lancets (ONETOUCH ULTRASOFT) lancets Use as instructed   losartan (COZAAR) 25 MG tablet Take 1 tablet (25 mg total) by mouth daily.   metFORMIN (GLUCOPHAGE) 500 MG tablet Take 1 tablet (500 mg total) by mouth 2 (two) times daily with a meal.   omeprazole (PRILOSEC) 40 MG capsule Take 1 capsule (40 mg total) by mouth daily before breakfast.   ONETOUCH VERIO test strip Check blood sugar up to 2 times daily as instructed   rosuvastatin (CRESTOR) 20 MG tablet Take 1 tablet (20 mg total) by mouth daily.   [DISCONTINUED] neomycin-polymyxin b-dexamethasone (MAXITROL) 3.5-10000-0.1 SUSP SMARTSIG:In Eye(s)   [DISCONTINUED] gabapentin (NEURONTIN) 100 MG capsule Start 1 capsule daily, increase by 1 cap every 2-3 days as tolerated up to 3 times a day, or may take 3 at once in  evening.   No facility-administered encounter medications on file as of 07/03/2021.    Allergies as of 07/03/2021 - Review Complete 07/03/2021  Allergen Reaction Noted   Codeine     Biaxin [clarithromycin] Other (See Comments) 09/01/2012   Mucinex [guaifenesin er] Nausea And Vomiting 09/01/2012    Past Medical History:  Diagnosis Date   Allergy    Arthritis    GERD (gastroesophageal reflux disease)    Hyperlipidemia    Non-melanoma skin cancer    Ruptured intervertebral disc    Sleep apnea    Was diagnosed with moderate to light sleep apnea   Ulcer     Past Surgical History:  Procedure Laterality Date   APPENDECTOMY     COLONOSCOPY     FRACTURE SURGERY  1980's   Lower 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   HERNIA REPAIR  2014?   Umbilical hernia. Mesh installed.   INSERTION OF MESH N/A 07/16/2012   Procedure: INSERTION OF MESH;  Surgeon: Harl Bowie, MD;  Location: [facility];  Service: General;  Laterality: N/A;   ORBITAL FRACTURE SURGERY     UMBILICAL HERNIA REPAIR N/A 07/16/2012   Procedure: HERNIA REPAIR UMBILICAL WITH MESH;  Surgeon: Harl Bowie, MD;  Location: [facility];  Service: General;  Laterality: N/A;   UPPER GI ENDOSCOPY      Family History  Problem Relation Age of Onset   Emphysema Father        died age 54   Heart failure Father    COPD Father  Cancer Mother    Heart disease Paternal Grandfather    COPD Paternal Grandfather     Social History   Socioeconomic History   Marital status: Divorced    Spouse name: Not on file   Number of children: 2   Years of education: Not on file   Highest education level: Not on file  Occupational History    Employer: [employer]  Tobacco Use   Smoking status: Former    Packs/day: 1.00    Years: 33.00    Pack years: 33.00    Types: Cigarettes    Quit date: 09/06/2008    Years since quitting: 12.8   Smokeless tobacco: Former  Scientific laboratory technician Use: Never  used  Substance and Sexual Activity   Alcohol use: No   Drug use: No   Sexual activity: Not on file  Other Topics Concern   Not on file  Social History Narrative   Two natural children and three step.  Lives with wife.    Social Determinants of Health   Financial Resource Strain: Low Risk    Difficulty of Paying Living Expenses: Not hard at all  Food Insecurity: No Food Insecurity   Worried About Charity fundraiser in the Last Year: Never true   [place] in the Last Year: Never true  Transportation Needs: No Transportation Needs   Lack of Transportation (Medical): No   Lack of Transportation (Non-Medical): No  Physical Activity: Inactive   Days of Exercise per Week: 0 days   Minutes of Exercise per Session: 0 min  Stress: No Stress Concern Present   Feeling of Stress : Not at all  Social Connections: Socially Isolated   Frequency of Communication with Friends and Family: More than three times a week   Frequency of Social Gatherings with Friends and Family: Once a week   Attends Religious Services: Never   Marine scientist or Organizations: No   Attends Music therapist: Never   Marital Status: Divorced  Human resources officer Violence: Not At Risk   Fear of Current or Ex-Partner: No   Emotionally Abused: No   Physically Abused: No   Sexually Abused: No    Review of systems: Review of Systems  Constitutional: Negative for fever and chills.  HENT: Negative.   Eyes: Negative for blurred vision.  Respiratory: as per HPI  Cardiovascular: Negative for chest pain and palpitations.  Gastrointestinal: Negative for vomiting, diarrhea, blood per rectum. Genitourinary: Negative for dysuria, urgency, frequency and hematuria.  Musculoskeletal: Negative for myalgias, back pain and joint pain.  Skin: Negative for itching and rash.  Neurological: Negative for dizziness, tremors, focal weakness, seizures and loss of consciousness.  Endo/Heme/Allergies: Negative  for environmental allergies.  Psychiatric/Behavioral: Negative for depression, suicidal ideas and hallucinations.  All other systems reviewed and are negative.  Physical Exam: Blood pressure 134/60, pulse 83, temperature 98.1 F (36.7 C), temperature source Oral, height '5\' 9"'$  (1.753 m), weight 246 lb 6.4 oz (111.8 kg), SpO2 95 %. Gen:      No acute distress HEENT:  EOMI, sclera anicteric Neck:     No masses; no thyromegaly Lungs:    Clear to auscultation bilaterally; normal respiratory effort CV:         Regular rate and rhythm; no murmurs Abd:      + bowel sounds; soft, non-tender; no palpable masses, no distension Ext:    No edema; adequate peripheral perfusion Skin:  Warm and dry; no rash Neuro: alert and oriented x 3 Psych: normal mood and affect  Data Reviewed: Imaging: Screening CT chest 05/14/21 - Paraseptal emphysema. Lower lung predominant reticular capacities, pulmonary nodules. I have reviewed the images personally.  PFTs:  Labs:  Assessment:  Evaluation for interstitial lung disease I reviewed a CT scan that shows changes that could be probable UIP pattern of pulmonary fibrosis. He does not have any signs and symptoms of connective tissue disease and minimal exposures. We will send baseline serologies for connective tissue disease, get high-resolution CT and PFTs for better evaluation  Obstructive sleep apnea Discussed his diagnosis of sleep apnea. He is currently not on treatment with CPAP He would like to hold off reordering the home sleep study for now until we have a better idea of his interstitial lung disease  Plan/Recommendations: CTD serology's, high-resolution CT, PFTs  Paul Garfinkel MD [facility] 07/03/2021, 2:13 PM  CC: [name] *

## 2021-07-05 ENCOUNTER — Other Ambulatory Visit: Payer: Self-pay | Admitting: Pulmonary Disease

## 2021-07-05 ENCOUNTER — Other Ambulatory Visit: Payer: Self-pay | Admitting: Family Medicine

## 2021-07-05 ENCOUNTER — Other Ambulatory Visit: Payer: 59

## 2021-07-05 DIAGNOSIS — E1169 Type 2 diabetes mellitus with other specified complication: Secondary | ICD-10-CM | POA: Diagnosis not present

## 2021-07-05 DIAGNOSIS — J849 Interstitial pulmonary disease, unspecified: Secondary | ICD-10-CM | POA: Diagnosis not present

## 2021-07-06 LAB — COMPLETE METABOLIC PANEL WITH GFR
AG Ratio: 1.5 (calc) (ref 1.0–2.5)
ALT: 15 U/L (ref 9–46)
AST: 18 U/L (ref 10–35)
Albumin: 4.3 g/dL (ref 3.6–5.1)
Alkaline phosphatase (APISO): 38 U/L (ref 35–144)
BUN: 15 mg/dL (ref 7–25)
CO2: 30 mmol/L (ref 20–32)
Calcium: 9.3 mg/dL (ref 8.6–10.3)
Chloride: 101 mmol/L (ref 98–110)
Creat: 0.88 mg/dL (ref 0.70–1.35)
Globulin: 2.9 g/dL (calc) (ref 1.9–3.7)
Glucose, Bld: 105 mg/dL — ABNORMAL HIGH (ref 65–99)
Potassium: 4.5 mmol/L (ref 3.5–5.3)
Sodium: 140 mmol/L (ref 135–146)
Total Bilirubin: 0.6 mg/dL (ref 0.2–1.2)
Total Protein: 7.2 g/dL (ref 6.1–8.1)
eGFR: 93 mL/min/{1.73_m2} (ref >=60)

## 2021-07-06 LAB — ANA: Anti Nuclear Antibody (ANA): NEGATIVE

## 2021-07-06 LAB — ANTI-DNA ANTIBODY, DOUBLE-STRANDED: ds DNA Ab: 5 IU/mL — ABNORMAL HIGH

## 2021-07-06 LAB — HEMOGLOBIN A1C
Hgb A1c MFr Bld: 6.8 % of total Hgb — ABNORMAL HIGH (ref <5.7)
Mean Plasma Glucose: 148 mg/dL
eAG (mmol/L): 8.2 mmol/L

## 2021-07-06 LAB — RHEUMATOID FACTOR: Rheumatoid fact SerPl-aCnc: <14 IU/mL (ref <14)

## 2021-07-06 LAB — CYCLIC CITRUL PEPTIDE ANTIBODY, IGG: Cyclic Citrullin Peptide Ab: <16 UNITS

## 2021-07-13 ENCOUNTER — Ambulatory Visit
Admission: RE | Admit: 2021-07-13 | Discharge: 2021-07-13 | Disposition: A | Discharge status: Home / Self Care | Payer: Medicare HMO | Source: Ambulatory Visit | Attending: Pulmonary Disease | Admitting: Pulmonary Disease

## 2021-07-13 DIAGNOSIS — J84112 Idiopathic pulmonary fibrosis: Secondary | ICD-10-CM | POA: Diagnosis not present

## 2021-07-13 DIAGNOSIS — J849 Interstitial pulmonary disease, unspecified: Secondary | ICD-10-CM | POA: Insufficient documentation

## 2021-07-13 DIAGNOSIS — I700 Atherosclerosis of aorta: Secondary | ICD-10-CM | POA: Diagnosis not present

## 2021-07-13 DIAGNOSIS — J432 Centrilobular emphysema: Secondary | ICD-10-CM | POA: Diagnosis not present

## 2021-07-13 DIAGNOSIS — J479 Bronchiectasis, uncomplicated: Secondary | ICD-10-CM | POA: Diagnosis not present

## 2021-07-16 ENCOUNTER — Telehealth: Payer: Self-pay | Admitting: Pulmonary Disease

## 2021-07-16 NOTE — Telephone Encounter (Signed)
Call report>> HRCT 12/05/21 showed slightly spiculated appearing 8 mm medial left lower lobe nodule, new from 04/02/2005. Adenocarcinoma cannot be excluded. Nodule size is borderline for PET resolution. Consider follow-up CT chest without contrast in 3 months.   I will call patient and notify him of new nodule and let Dr. Vaughan Browner decide how he wants to manage the nodule.

## 2021-07-16 NOTE — Telephone Encounter (Signed)
This is an FYI to the provider since Dr. Vaughan Browner is off.

## 2021-07-20 NOTE — Telephone Encounter (Signed)
Can you relay above message from Dr. Vaughan Browner to paitent. CT was reviewed by him and nodule looks stable from earlier this year in March, may not be big enough for PET scan. We will follow it on repeat CT in 6 months which has already been ordered

## 2021-07-20 NOTE — Telephone Encounter (Signed)
Called and spoke with pt letting him know the results of the CT and he verbalized understanding. Nothing further needed. 

## 2021-07-20 NOTE — Telephone Encounter (Signed)
I reviewed his CT and the lung nodule looks stable from scan earlier this year in March may not be picked up on PET. Paul Johnston had already ordered a 6 month follow-up CT from the first scan and we can review that when complete.  Nothing further needed.

## 2021-09-11 ENCOUNTER — Encounter: Payer: Self-pay | Admitting: Pulmonary Disease

## 2021-09-11 ENCOUNTER — Ambulatory Visit: Payer: Medicare HMO | Admitting: Pulmonary Disease

## 2021-09-11 ENCOUNTER — Ambulatory Visit (INDEPENDENT_AMBULATORY_CARE_PROVIDER_SITE_OTHER): Payer: Medicare HMO | Admitting: Pulmonary Disease

## 2021-09-11 ENCOUNTER — Telehealth: Payer: Self-pay | Admitting: Pharmacist

## 2021-09-11 VITALS — BP 136/64 | HR 101 | Temp 97.8°F | Ht 69.0 in | Wt 246.0 lb

## 2021-09-11 DIAGNOSIS — R911 Solitary pulmonary nodule: Secondary | ICD-10-CM

## 2021-09-11 DIAGNOSIS — J849 Interstitial pulmonary disease, unspecified: Secondary | ICD-10-CM

## 2021-09-11 LAB — PULMONARY FUNCTION TEST
DL/VA % pred: 79 %
DL/VA: 3.25 ml/min/mmHg/L
DLCO cor % pred: 53 %
DLCO cor: 13.43 ml/min/mmHg
DLCO unc % pred: 53 %
DLCO unc: 13.43 ml/min/mmHg
FEF 25-75 Post: 2.85 L/sec
FEF 25-75 Pre: 2.04 L/sec
FEF2575-%Change-Post: 39 %
FEF2575-%Pred-Post: 117 %
FEF2575-%Pred-Pre: 84 %
FEV1-%Change-Post: 8 %
FEV1-%Pred-Post: 76 %
FEV1-%Pred-Pre: 70 %
FEV1-Post: 2.41 L
FEV1-Pre: 2.23 L
FEV1FVC-%Change-Post: 0 %
FEV1FVC-%Pred-Pre: 107 %
FEV6-%Change-Post: 7 %
FEV6-%Pred-Post: 74 %
FEV6-%Pred-Pre: 69 %
FEV6-Post: 3.02 L
FEV6-Pre: 2.81 L
FEV6FVC-%Change-Post: 0 %
FEV6FVC-%Pred-Post: 105 %
FEV6FVC-%Pred-Pre: 105 %
FVC-%Change-Post: 7 %
FVC-%Pred-Post: 70 %
FVC-%Pred-Pre: 65 %
FVC-Post: 3.03 L
FVC-Pre: 2.81 L
Post FEV1/FVC ratio: 79 %
Post FEV6/FVC ratio: 99 %
Pre FEV1/FVC ratio: 79 %
Pre FEV6/FVC Ratio: 100 %
RV % pred: 86 %
RV: 2.06 L
TLC % pred: 83 %
TLC: 5.68 L

## 2021-09-11 NOTE — Progress Notes (Signed)
Paul Johnston    [id number]    [birth year]  Primary Care Physician:[name], Devonne Doughty, DO  Referring Physician: Olin Hauser, DO [address] [place],  [place] [address]  Chief complaint: Follow up for interstitial lung disease  HPI: 71 year old with history of hypertension, diabetes, hyperlipidemia, sleep apnea. Previously seen by Dr. Annamaria Boots in 2018 for OSA,  AHA 14.7. CPAP suggested but he never got started on it  He recently had a low-dose screening CT that showed prominent pulmonary fibrosis and has been referred here for further evaluation States that he has mild dyspnea on exertion, no cough, congestion  Pets: dogs Occupation: used to work as an Customer service manager.  Exposures:Previously exposed to cotton dust and metal grinding ILD questionnaire 07/03/21 - as above Smoking history: 37 pack year smoker. Quit in 2010 Travel history: no significant travel history Relevant family history: father and grandfather had emphysema. There were smokers.  Interim history: Here for review of CT and PFTs.  Breathing is doing well with no changes No new complaints today   Outpatient Encounter Medications as of 09/11/2021  Medication Sig   Cholecalciferol (VITAMIN D3) 50 MCG (2000 UT) TABS Take 2,000 Units by mouth daily.   Lancets (ONETOUCH ULTRASOFT) lancets Use as instructed   losartan (COZAAR) 25 MG tablet Take 1 tablet (25 mg total) by mouth daily.   metFORMIN (GLUCOPHAGE) 500 MG tablet Take 1 tablet (500 mg total) by mouth 2 (two) times daily with a meal.   omeprazole (PRILOSEC) 40 MG capsule Take 1 capsule (40 mg total) by mouth daily before breakfast.   ONETOUCH VERIO test strip Check blood sugar up to 2 times daily as instructed   rosuvastatin (CRESTOR) 20 MG tablet Take 1 tablet (20 mg total) by mouth daily.   No facility-administered encounter medications on file as of 09/11/2021.   Physical Exam: Blood pressure 136/64, pulse (!) 101, temperature 97.8 F  (36.6 C), temperature source Oral, height '5\' 9"'$  (1.753 m), weight 246 lb (111.6 kg), SpO2 95 %. Gen:      No acute distress HEENT:  EOMI, sclera anicteric Neck:     No masses; no thyromegaly Lungs:    Clear to auscultation bilaterally; normal respiratory effort  CV:         Regular rate and rhythm; no murmurs Abd:      + bowel sounds; soft, non-tender; no palpable masses, no distension Ext:    No edema; adequate peripheral perfusion Skin:      Warm and dry; no rash Neuro: alert and oriented x 3 Psych: normal mood and affect   Data Reviewed: Imaging: Screening CT chest 05/14/21 - Paraseptal emphysema. Lower lung predominant reticular capacities, pulmonary nodules.   High-resolution CT 07/13/2021-emphysema, subpleural reticulation, groundglass and traction bronchiectasis with basilar predominance.  Probable UIP pattern.  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 He does not have any signs and symptoms of connective tissue disease and minimal exposures. 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  We reviewed  treatment options and have decided to start treatment with pirfenidone Hepatic panel in May 2023 is within normal limits  Left lower lobe lung nodule Follow-up CT ordered for later this year.  Obstructive sleep apnea Discussed his diagnosis of sleep apnea. He is currently not on treatment with CPAP He would like to hold off reordering the home sleep study for now until we have a better  idea of his interstitial lung disease  Plan/Recommendations: CTD serology's, high-resolution CT, PFTs  Marshell Garfinkel MD [facility] 09/11/2021, 11:12 AM  CC: [name] *

## 2021-09-11 NOTE — Patient Instructions (Signed)
We have reviewed your test so far which show a lung condition called pulmonary fibrosis We will start you on a medication called pirfenidone to help slow this process down Follow-up in clinic in 3 months

## 2021-09-11 NOTE — Telephone Encounter (Signed)
Received new start paperwork for Esbriet/pirfenidone.  Submitted a Prior Authorization request to [facility] for PIRFENIDONE via CoverMyMeds. Will update once we receive a response.  Key: [place] patient assistance application in PAP pending info folder in pharmacy office while we await PA determination  Knox Saliva, PharmD, MPH, BCPS, CPP Clinical Pharmacist (Rheumatology and Pulmonology)

## 2021-09-11 NOTE — Progress Notes (Signed)
Full PFT performed today. °

## 2021-09-11 NOTE — Patient Instructions (Signed)
Full PFT performed today. °

## 2021-09-12 ENCOUNTER — Other Ambulatory Visit (HOSPITAL_COMMUNITY): Payer: Self-pay

## 2021-09-12 NOTE — Telephone Encounter (Signed)
Received notification from [facility] regarding a prior authorization for PIRFENIDONE. Authorization has been APPROVED from 02/11/2021 to 02/10/2022.   Per test claim, copay for 30 days supply is $1,426.39  Patient can fill through [place]: [phone]   Authorization # [id number]   Submitted Patient Assistance Application to [facility] for [place] along with provider portion, PA and income documents. Will update patient when we receive a response.  Fax# [phone] Phone# [phone]

## 2021-09-24 NOTE — Telephone Encounter (Signed)
Called patient and advised him that Dr Vaughan Browner schedule is not open yet for his follow up. I advised patient that a recall letter is in place for his follow up. Nothing further needed

## 2021-09-24 NOTE — Telephone Encounter (Signed)
Received a fax from [place] regarding an approval for [place] patient assistance from 09/19/2021 until patient no longer qualifies due to discontinuation of therapy, changes to patient's current financial status or health insurance and/or patient no longer meets the program eligibility requirements.  Called pt and verified that Paul Johnston has been in contact with them, however he has not contacted [facility] to schedule shipment at this time. He has already been provided the phone number to follow up.  Relevant Documents have been sent to scan center.  [facility] Phone#: [phone] option 5 [facility] Phone#: [phone]   Inquired if pt had any additional questions while I had him on the line and he states that he did a bit more research into his diagnosis once he got home and now feels that it is a much more serious disease than he initially thought during his OV with Dr. Vaughan Browner, so he would like to speak with someone regarding this. He also wants to ensure his 48-monthf/u appt with Dr. MVaughan Browneris able to get scheduled, he was unable to do so the last time he was here due to the Dr.'s schedule not going out that far at that time. Advised that I would have my pharmacist Devki reach out once she is back in the office later in the week to discuss clinical information with pt, and that I would request someone also reach out to him to see about getting f/u appt scheduled.

## 2021-09-26 ENCOUNTER — Telehealth: Payer: Self-pay | Admitting: Pharmacist

## 2021-09-26 DIAGNOSIS — Z5181 Encounter for therapeutic drug level monitoring: Secondary | ICD-10-CM

## 2021-09-26 DIAGNOSIS — J849 Interstitial pulmonary disease, unspecified: Secondary | ICD-10-CM

## 2021-09-26 MED ORDER — PIRFENIDONE 267 MG PO TABS: ORAL_TABLET | ORAL | 0 refills | Status: DC

## 2021-09-26 NOTE — Telephone Encounter (Signed)
Subjective:  Patient called today by [facility] pharmacy team for Esbriet new start counseling.  Patient was last seen by Dr. Vaughan Browner on 09/11/21.  Pertinent past medical history includes IPF, GERD, T2DM, hyperlipidemia, BPH, non-melanoma skin cancer, former tobacco user. He is naive to antifibrotics.  History of elevated LFTs: No History of diarrhea, nausea, vomiting: No  Objective: Allergies  Allergen Reactions   Codeine    Biaxin [Clarithromycin] Other (See Comments)    GI upset   Mucinex [Guaifenesin Er] Nausea And Vomiting    Outpatient Encounter Medications as of 09/26/2021  Medication Sig   Cholecalciferol (VITAMIN D3) 50 MCG (2000 UT) TABS Take 2,000 Units by mouth daily.   Lancets (ONETOUCH ULTRASOFT) lancets Use as instructed   losartan (COZAAR) 25 MG tablet Take 1 tablet (25 mg total) by mouth daily.   metFORMIN (GLUCOPHAGE) 500 MG tablet Take 1 tablet (500 mg total) by mouth 2 (two) times daily with a meal.   omeprazole (PRILOSEC) 40 MG capsule Take 1 capsule (40 mg total) by mouth daily before breakfast.   ONETOUCH VERIO test strip Check blood sugar up to 2 times daily as instructed   rosuvastatin (CRESTOR) 20 MG tablet Take 1 tablet (20 mg total) by mouth daily.   No facility-administered encounter medications on file as of 09/26/2021.     Immunization History  Administered Date(s) Administered   Fluad Quad(high Dose 65+) 12/08/2018, 12/09/2019, 11/28/2020   Influenza, High Dose Seasonal PF 12/12/2016, 12/17/2017   Influenza,inj,Quad PF,6+ Mos 11/18/2012, 12/21/2015   PFIZER Comirnaty(Gray Top)Covid-19 Tri-Sucrose Vaccine 06/29/2020   PFIZER(Purple Top)SARS-COV-2 Vaccination 03/20/2019, 04/10/2019, 11/18/2019   Pfizer Covid-19 Vaccine Bivalent Booster 80yr & up 11/17/2020   Pneumococcal Conjugate-13 12/02/2012   Pneumococcal Polysaccharide-23 06/11/2017   Td 11/20/2004, 12/21/2010   Tdap 12/21/2010      PFT's TLC  Date Value Ref Range Status   09/11/2021 5.68 L Preliminary      CMP     Component Value Date/Time   NA 140 07/05/2021 0928   NA 139 06/11/2017 1625   K 4.5 07/05/2021 0928   CL 101 07/05/2021 0928   CO2 30 07/05/2021 0928   GLUCOSE 105 (H) 07/05/2021 0928   BUN 15 07/05/2021 0928   BUN 18 06/11/2017 1625   CREATININE 0.88 07/05/2021 0928   CALCIUM 9.3 07/05/2021 0928   PROT 7.2 07/05/2021 0928   PROT 7.2 06/11/2017 1625   ALBUMIN 4.3 06/11/2017 1625   AST 18 07/05/2021 0928   ALT 15 07/05/2021 0928   ALKPHOS 45 06/11/2017 1625   BILITOT 0.6 07/05/2021 0928   BILITOT 0.5 06/11/2017 1625   GFRNONAA 87 10/25/2019 0855   GFRAA 101 10/25/2019 0855      CBC    Component Value Date/Time   WBC 8.7 10/25/2020 0916   RBC 5.20 10/25/2020 0916   HGB 14.7 10/25/2020 0916   HGB 14.4 06/11/2017 1625   HCT 45.6 10/25/2020 0916   HCT 43.6 06/11/2017 1625   PLT 167 10/25/2020 0916   PLT 209 06/11/2017 1625   MCV 87.7 10/25/2020 0916   MCV 87 06/11/2017 1625   MCH 28.3 10/25/2020 0916   MCHC 32.2 10/25/2020 0916   RDW 13.2 10/25/2020 0916   RDW 14.2 06/11/2017 1625   LYMPHSABS 2,140 10/25/2020 0916   LYMPHSABS 2.6 06/11/2017 1625   EOSABS 374 10/25/2020 0916   EOSABS 0.3 06/11/2017 1625   BASOSABS 70 10/25/2020 0916   BASOSABS 0.0 06/11/2017 1625      LFT's  Latest Ref Rng & Units 07/05/2021    9:28 AM 10/25/2020    9:16 AM 10/25/2019    8:55 AM  Hepatic Function  Total Protein 6.1 - 8.1 g/dL 7.2  6.8  6.8   AST 10 - 35 U/L '18  21  19   '$ ALT 9 - 46 U/L '15  16  16   '$ Total Bilirubin 0.2 - 1.2 mg/dL 0.6  0.6  0.6     HRCT (07/13/21) - Pulmonary parenchymal pattern of fibrosis, as detailed above, has progressed from 04/02/2005. Findings are categorized as probable UIP per consensus guidelines  Assessment and Plan  Esbriet Medication Management Thoroughly counseled patient on the efficacy, mechanism of action, dosing, administration, adverse effects, and monitoring parameters of Esbriet.  Patient  verbalized understanding. Patient education handout provided.   Goals of Therapy: Will not stop or reverse the progression of ILD. It will slow the progression of ILD.   Dosing: Starting dose will be Esbriet 267 mg 1 tablet three times daily for 7 days, then 2 tablets three times daily for 7 days, then 3 tablets three times daily.  Maintenance dose will be 801 mg 1 tablet three times daily if tolerated.  Stressed the importance of taking with meals and space at least 5-6 hours apart to minimize stomach upset.   Adverse Effects: Nausea, vomiting, diarrhea, weight loss Abdominal pain GERD Sun sensitivity/rash - patient advised to wear sunscreen when exposed to sunlight Dizziness Fatigue  Monitoring: Monitor for diarrhea, nausea and vomiting, GI perforation, hepatotoxicity  Monitor LFTs - baseline, monthly for first 6 months, then every 3 months routinely CBC w differential at baseline and every 3 months routinely  Access: Approval of Esbriet through: patient assistance Rx sent to: [place] ([place]) for [place]: [phone] Shipment is scheduled for 09/28/21.  Medication Reconciliation A drug regimen assessment was performed, including review of allergies, interactions, disease-state management, dosing and immunization history. Medications were reviewed with the patient, including name, instructions, indication, goals of therapy, potential side effects, importance of adherence, and safe use.  No interactions with Esbriet noted.  Thank you for involving pharmacy to assist in providing this patient's care.   Knox Saliva, PharmD, MPH, BCPS, CPP Clinical Pharmacist (Rheumatology and Pulmonology)

## 2021-10-02 ENCOUNTER — Telehealth: Payer: Self-pay | Admitting: Pulmonary Disease

## 2021-10-02 DIAGNOSIS — J849 Interstitial pulmonary disease, unspecified: Secondary | ICD-10-CM

## 2021-10-02 MED ORDER — PIRFENIDONE 267 MG PO TABS: 801.0000 mg | ORAL_TABLET | Freq: Three times a day (TID) | ORAL | 2 refills | Status: DC

## 2021-10-02 NOTE — Telephone Encounter (Signed)
Refill sent for ESBRIET to [facility] ([facility]) for Esbriet: [phone]  Dose: 801 mg (3 tablets) three times daily  Last OV: 09/11/21 Provider: Dr. Vaughan Browner  Next OV: not yet scheduled  Knox Saliva, PharmD, MPH, BCPS Clinical Pharmacist (Rheumatology and Pulmonology)

## 2021-10-03 NOTE — Telephone Encounter (Signed)
Called patient and advised that rx for Esbriet was sent for maintenance dose. His name is on list for F/u appointment in Nov with Dr. Vaughan Browner. I did advise him to reach out to our clinic if he hasn't received recall letter by the end of October  Knox Saliva, PharmD, MPH, BCPS, CPP Clinical Pharmacist (Rheumatology and Pulmonology)

## 2021-10-22 ENCOUNTER — Telehealth: Payer: Self-pay | Admitting: Pulmonary Disease

## 2021-10-22 NOTE — Telephone Encounter (Signed)
Pt scheduled on 11/15 with PM. Nothing further needed.

## 2021-10-22 NOTE — Telephone Encounter (Signed)
Called and spoke with patient, he states that since he started taking the Esbriet he has been sleeping a lot during the day, not just for an hour, but a lot during the day.  He is also having diarrhea.  I reviewed the side effects of Esbriet with him.  I asked him about snoring, he stated that he was diagnosed in 2018 with sleep apnea and at the time his insurance was not going to cover the CPAP machine enough for him to be able to afford it.  He was still working at that time.  He said that he wanted to get the ILD and medication straightened out first and then deal with the sleep apnea.  He has also received a summons for jury duty for mid November, he is requesting a letter regarding his medical condition to not have to serve on [place].  He said the fatigue and tiredness is the biggest issue.  He has to have his letter turned in before mid October.  Dr. Vaughan Browner, please advise.  Regarding symptoms and letter.   Thank you.

## 2021-10-25 NOTE — Telephone Encounter (Signed)
Okay to give jury letter stating that he has interstitial lung disease and needs to be excused.  I can sign it while I am in office tomorrow

## 2021-10-29 NOTE — Telephone Encounter (Signed)
Called patient and let him know that Dr Vaughan Browner is ok to do the letter. Letter printed and signed. Nothing further needed

## 2021-10-31 ENCOUNTER — Other Ambulatory Visit: Payer: 59

## 2021-11-09 ENCOUNTER — Encounter: Payer: 59 | Admitting: Family Medicine

## 2021-11-22 ENCOUNTER — Other Ambulatory Visit: Payer: Self-pay

## 2021-11-22 DIAGNOSIS — E559 Vitamin D deficiency, unspecified: Secondary | ICD-10-CM

## 2021-11-22 DIAGNOSIS — E1169 Type 2 diabetes mellitus with other specified complication: Secondary | ICD-10-CM

## 2021-11-22 DIAGNOSIS — N400 Enlarged prostate without lower urinary tract symptoms: Secondary | ICD-10-CM

## 2021-11-22 DIAGNOSIS — Z0000 Encounter for general adult medical examination without abnormal findings: Secondary | ICD-10-CM

## 2021-11-23 ENCOUNTER — Other Ambulatory Visit: Payer: 59

## 2021-11-23 DIAGNOSIS — E1169 Type 2 diabetes mellitus with other specified complication: Secondary | ICD-10-CM | POA: Diagnosis not present

## 2021-11-23 DIAGNOSIS — E559 Vitamin D deficiency, unspecified: Secondary | ICD-10-CM | POA: Diagnosis not present

## 2021-11-23 DIAGNOSIS — N400 Enlarged prostate without lower urinary tract symptoms: Secondary | ICD-10-CM | POA: Diagnosis not present

## 2021-11-23 DIAGNOSIS — Z0000 Encounter for general adult medical examination without abnormal findings: Secondary | ICD-10-CM | POA: Diagnosis not present

## 2021-11-23 DIAGNOSIS — E785 Hyperlipidemia, unspecified: Secondary | ICD-10-CM | POA: Diagnosis not present

## 2021-11-24 LAB — COMPLETE METABOLIC PANEL WITH GFR
AG Ratio: 1.6 (calc) (ref 1.0–2.5)
ALT: 14 U/L (ref 9–46)
AST: 19 U/L (ref 10–35)
Albumin: 4.1 g/dL (ref 3.6–5.1)
Alkaline phosphatase (APISO): 35 U/L (ref 35–144)
BUN: 17 mg/dL (ref 7–25)
CO2: 27 mmol/L (ref 20–32)
Calcium: 9.3 mg/dL (ref 8.6–10.3)
Chloride: 103 mmol/L (ref 98–110)
Creat: 1.12 mg/dL (ref 0.70–1.35)
Globulin: 2.6 g/dL (calc) (ref 1.9–3.7)
Glucose, Bld: 117 mg/dL — ABNORMAL HIGH (ref 65–99)
Potassium: 4.2 mmol/L (ref 3.5–5.3)
Sodium: 139 mmol/L (ref 135–146)
Total Bilirubin: 0.4 mg/dL (ref 0.2–1.2)
Total Protein: 6.7 g/dL (ref 6.1–8.1)
eGFR: 71 mL/min/{1.73_m2} (ref >=60)

## 2021-11-24 LAB — CBC WITH DIFFERENTIAL/PLATELET
Absolute Monocytes: 761 cells/uL (ref 200–950)
Basophils Absolute: 66 cells/uL (ref 0–200)
Basophils Relative: 0.7 %
Eosinophils Absolute: 263 cells/uL (ref 15–500)
Eosinophils Relative: 2.8 %
HCT: 45.4 % (ref 38.5–50.0)
Hemoglobin: 14.8 g/dL (ref 13.2–17.1)
Lymphs Abs: 2181 cells/uL (ref 850–3900)
MCH: 28.1 pg (ref 27.0–33.0)
MCHC: 32.6 g/dL (ref 32.0–36.0)
MCV: 86.3 fL (ref 80.0–100.0)
MPV: 10.6 fL (ref 7.5–12.5)
Monocytes Relative: 8.1 %
Neutro Abs: 6129 cells/uL (ref 1500–7800)
Neutrophils Relative %: 65.2 %
Platelets: 184 10*3/uL (ref 140–400)
RBC: 5.26 10*6/uL (ref 4.20–5.80)
RDW: 13.6 % (ref 11.0–15.0)
Total Lymphocyte: 23.2 %
WBC: 9.4 10*3/uL (ref 3.8–10.8)

## 2021-11-24 LAB — LIPID PANEL
Cholesterol: 111 mg/dL (ref <200)
HDL: 40 mg/dL (ref >=40)
LDL Cholesterol (Calc): 49 mg/dL (calc)
Non-HDL Cholesterol (Calc): 71 mg/dL (calc) (ref <130)
Total CHOL/HDL Ratio: 2.8 (calc) (ref <5.0)
Triglycerides: 134 mg/dL (ref <150)

## 2021-11-24 LAB — HEMOGLOBIN A1C
Hgb A1c MFr Bld: 6.9 % of total Hgb — ABNORMAL HIGH (ref <5.7)
Mean Plasma Glucose: 151 mg/dL
eAG (mmol/L): 8.4 mmol/L

## 2021-11-24 LAB — VITAMIN D 25 HYDROXY (VIT D DEFICIENCY, FRACTURES): Vit D, 25-Hydroxy: 36 ng/mL (ref 30–100)

## 2021-11-24 LAB — PSA: PSA: 0.72 ng/mL (ref <=4.00)

## 2021-11-24 LAB — TSH: TSH: 2.63 mIU/L (ref 0.40–4.50)

## 2021-11-26 ENCOUNTER — Encounter: Payer: Self-pay | Admitting: Family Medicine

## 2021-11-30 ENCOUNTER — Other Ambulatory Visit: Payer: Self-pay | Admitting: Pharmacist

## 2021-11-30 ENCOUNTER — Encounter: Payer: Self-pay | Admitting: Family Medicine

## 2021-11-30 ENCOUNTER — Ambulatory Visit (INDEPENDENT_AMBULATORY_CARE_PROVIDER_SITE_OTHER): Payer: 59 | Admitting: Family Medicine

## 2021-11-30 VITALS — BP 132/68 | HR 71 | Ht 69.0 in | Wt 240.0 lb

## 2021-11-30 DIAGNOSIS — E1169 Type 2 diabetes mellitus with other specified complication: Secondary | ICD-10-CM

## 2021-11-30 DIAGNOSIS — Z23 Encounter for immunization: Secondary | ICD-10-CM

## 2021-11-30 DIAGNOSIS — E785 Hyperlipidemia, unspecified: Secondary | ICD-10-CM

## 2021-11-30 DIAGNOSIS — Z0000 Encounter for general adult medical examination without abnormal findings: Secondary | ICD-10-CM

## 2021-11-30 DIAGNOSIS — E559 Vitamin D deficiency, unspecified: Secondary | ICD-10-CM

## 2021-11-30 DIAGNOSIS — J8410 Pulmonary fibrosis, unspecified: Secondary | ICD-10-CM | POA: Insufficient documentation

## 2021-11-30 DIAGNOSIS — J849 Interstitial pulmonary disease, unspecified: Secondary | ICD-10-CM

## 2021-11-30 MED ORDER — ESBRIET 267 MG PO TABS: 801.0000 mg | ORAL_TABLET | Freq: Three times a day (TID) | ORAL | 2 refills | Status: DC

## 2021-11-30 NOTE — Patient Instructions (Addendum)
Thank you for coming to the office today.  High Dose Flu Shot today  Please message or call when ready - to do Nurse Visit for TDap vaccine and or / Shingrix (2 doses, 2-6 month apart)  Future Cologuard, let me know.  Next LDCT Repeat scan 12/07/21 - repeat low dose, from April 2023.  Your high resolution CT was done 07/2021 - please check with Pulmonology when you see them on 11/15  what their future plan is  Please message me in December when ready for new refills for the year, and remind me we can change the Metformin to XR extended '500mg'$  once daily with supper.  Please schedule a Follow-up Appointment to: Return in about 6 months (around 06/01/2022) for 6 month follow-up DM A1c, updates from Pulm.  If you have any other questions or concerns, please feel free to call the office or send a message through [place]. You may also schedule an earlier appointment if necessary.  Additionally, you may be receiving a survey about your experience at our office within a few days to 1 week by e-mail or mail. We value your feedback.  [name], DO [place]

## 2021-11-30 NOTE — Assessment & Plan Note (Signed)
Well-controlled DM with J4H 6.9 Complications - hyperlipidemia, obesity, GERD - increases risk of future cardiovascular complications  On ARB - Had mild elevated BP but home readings all normal.  Plan:  1. Switch Metformin IR 500 daily to XR daily for better results, less GI side effect 2. Encourage improved lifestyle - low carb, low sugar diet, reduce portion size, continue improving regular exercise 3. Check CBG , bring log to next visit for review 4. Continue Statin, ARB low dose

## 2021-11-30 NOTE — Telephone Encounter (Signed)
Refill sent for ESBRIET to [facility] ([place]) for Esbriet: [phone]  Dose: 801 mg (3 tabs of '267mg'$  tabs) three times daily  Last OV: 09/11/21 Provider: Dr. Vaughan Browner  Next OV: 12/26/21 LFTs on 11/23/21 wnl  Jennye Boroughs Wilhemina Bonito, PharmD, MPH, BCPS Clinical Pharmacist (Rheumatology and Pulmonology)

## 2021-11-30 NOTE — Assessment & Plan Note (Signed)
Followed by [place] w/ CT surveillance On Esbriet

## 2021-11-30 NOTE — Progress Notes (Signed)
Subjective:    Patient ID: Paul Johnston, male    DOB: 20-Feb-1950, 71 y.o.   MRN: [id number]  Paul Johnston is a 71 y.o. male presenting on 11/30/2021 for Annual Exam   HPI  Here for Annual Physical and Lab Review.  Interstitial Lung Disease / Pulmonary FIbrosis Followed by Dr Vaughan Browner Has had LDCT and PFT. Last LDCT 05/2021 then High resolution 07/2021, and next LDCT 6 months in 12/07/21 He has upcoming apt 12/26/21  Vitamin D Deficiency Currently on Vitamin D3 5,000 and 2,000 intermittent Last lab mild low but still normal range  CHRONIC DM, Type 2 Morbid Obestiy BMI >35 A1c 6.9 CBGs: Infrequent checks Meds: Metformin 522m once daily Reports good compliance. Tolerating well w/o side-effects Currently not on ACEi/ARB Lifestyle: Weight down 6 lbs in last 2 months - Diet (improving low carb DM diet)  - Exercise: limited - Dr WEllin MayhewDM Eye 12/03/20, upcoming visit Denies hypoglycemia, polyuria, visual changes, numbness or tingling.  HYPERLIPIDEMIA: - Reports no concerns. Last lipid panel 11/2021, controlled  - Currently taking Rosuvastatin 240m tolerating well without side effects or myalgias   Elevated BP Previous elevated on electronic cuff but improved on manual Home BP readings normal range. 120-130 / 60-70s Current Meds - Losartan 2562maily Denies CP, dyspnea, HA, edema, dizziness / lightheadedness    -------------------------  Health Maintenance:  PSA 0.72 (11/2021)  High Dose Flu Shot today  Return to TDap and Shingrix vaccine nurse visit     11/30/2021    3:45 PM 03/02/2021    2:54 PM 11/01/2020    2:15 PM  Depression screen PHQ 2/9  Decreased Interest '1 1 1  ' Down, Depressed, Hopeless 0 0 0  PHQ - 2 Score '1 1 1  ' Altered sleeping 1  1  Tired, decreased energy 1  1  Change in appetite 0  0  Feeling bad or failure about yourself  0  0  Trouble concentrating 0  0  Moving slowly or fidgety/restless 0  0  Suicidal thoughts 0  0  PHQ-9 Score 3  3   Difficult doing work/chores Not difficult at all  Not difficult at all    Past Medical History:  Diagnosis Date   Allergy    Arthritis    GERD (gastroesophageal reflux disease)    Hyperlipidemia    Non-melanoma skin cancer    Ruptured intervertebral disc    Sleep apnea    Was diagnosed with moderate to light sleep apnea   Ulcer    Past Surgical History:  Procedure Laterality Date   APPENDECTOMY     COLONOSCOPY     FRACTURE SURGERY  1980's   Lower 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   HERNIA REPAIR  2014?   Umbilical hernia. Mesh installed.   INSERTION OF MESH N/A 07/16/2012   Procedure: INSERTION OF MESH;  Surgeon: DouHarl BowieD;  [facility];  Laterality: N/A;   ORBITAL FRACTURE SURGERY     UMBILICAL HERNIA REPAIR N/A 07/16/2012   Procedure: HERNIA REPAIR UMBILICAL WITH MESH;  Surgeon: DouHarl BowieD;  [facility];  Laterality: N/A;   UPPER GI ENDOSCOPY     Social History   Socioeconomic History   Marital status: Divorced    Spouse name: Not on file   Number of children: 2   Years of education: Not on file   Highest education level: Not on file  Occupational History  Employer: [employer]  Tobacco Use   Smoking status: Former    Packs/day: 1.00    Years: 33.00    Total pack years: 33.00    Types: Cigarettes    Quit date: 09/06/2008    Years since quitting: 13.2   Smokeless tobacco: Former  Scientific laboratory technician Use: Never used  Substance and Sexual Activity   Alcohol use: No   Drug use: No   Sexual activity: Not on file  Other Topics Concern   Not on file  Social History Narrative   Two natural children and three step.  Lives with wife.    Social Determinants of Health   Financial Resource Strain: Low Risk  (03/02/2021)   Overall Financial Resource Strain (CARDIA)    Difficulty of Paying Living Expenses: Not hard at all  Food Insecurity: No Food  Insecurity (03/02/2021)   Hunger Vital Sign    Worried About Running Out of Food in the Last Year: Never true    Ran Out of Food in the Last Year: Never true  Transportation Needs: No Transportation Needs (03/02/2021)   PRAPARE - Hydrologist (Medical): No    Lack of Transportation (Non-Medical): No  Physical Activity: Inactive (03/02/2021)   Exercise Vital Sign    Days of Exercise per Week: 0 days    Minutes of Exercise per Session: 0 min  Stress: No Stress Concern Present (03/02/2021)   Axis    Feeling of Stress : Not at all  Social Connections: Socially Isolated (03/02/2021)   Social Connection and Isolation Panel [NHANES]    Frequency of Communication with Friends and Family: More than three times a week    Frequency of Social Gatherings with Friends and Family: Once a week    Attends Religious Services: Never    Marine scientist or Organizations: No    Attends Archivist Meetings: Never    Marital Status: Divorced  Human resources officer Violence: Not At Risk (03/02/2021)   Humiliation, Afraid, Rape, and Kick questionnaire    Fear of Current or Ex-Partner: No    Emotionally Abused: No    Physically Abused: No    Sexually Abused: No   Family History  Problem Relation Age of Onset   Emphysema Father        died age 7   Heart failure Father    COPD Father    Cancer Mother    Heart disease Paternal Grandfather    COPD Paternal Grandfather    Current Outpatient Medications on File Prior to Visit  Medication Sig   Cholecalciferol (VITAMIN D3) 50 MCG (2000 UT) TABS Take 2,000 Units by mouth daily.   Lancets (ONETOUCH ULTRASOFT) lancets Use as instructed   losartan (COZAAR) 25 MG tablet Take 1 tablet (25 mg total) by mouth daily.   metFORMIN (GLUCOPHAGE-XR) 500 MG 24 hr tablet Take 1 tablet (500 mg total) by mouth daily with supper.   omeprazole (PRILOSEC) 40 MG capsule  Take 1 capsule (40 mg total) by mouth daily before breakfast.   ONETOUCH VERIO test strip Check blood sugar up to 2 times daily as instructed   rosuvastatin (CRESTOR) 20 MG tablet Take 1 tablet (20 mg total) by mouth daily.   No current facility-administered medications on file prior to visit.    Review of Systems  Constitutional:  Negative for activity change, appetite change, chills, diaphoresis, fatigue and fever.  HENT:  Negative for congestion and hearing loss.   Eyes:  Negative for visual disturbance.  Respiratory:  Negative for cough, chest tightness, shortness of breath and wheezing.   Cardiovascular:  Negative for chest pain, palpitations and leg swelling.  Gastrointestinal:  Negative for abdominal pain, constipation, diarrhea, nausea and vomiting.  Genitourinary:  Negative for dysuria, frequency and hematuria.  Musculoskeletal:  Negative for arthralgias and neck pain.  Skin:  Negative for rash.  Neurological:  Negative for dizziness, weakness, light-headedness, numbness and headaches.  Hematological:  Negative for adenopathy.  Psychiatric/Behavioral:  Negative for behavioral problems, dysphoric mood and sleep disturbance.    Per HPI unless specifically indicated above      Objective:    BP 132/68 (BP Location: Left Arm, Cuff Size: Normal)   Pulse 71   Ht '5\' 9"'  (1.753 m)   Wt 240 lb (108.9 kg)   SpO2 98%   BMI 35.44 kg/m   Wt Readings from Last 3 Encounters:  11/30/21 240 lb (108.9 kg)  09/11/21 246 lb (111.6 kg)  07/03/21 246 lb 6.4 oz (111.8 kg)    Physical Exam Vitals and nursing note reviewed.  Constitutional:      General: He is not in acute distress.    Appearance: He is well-developed. He is not diaphoretic.     Comments: Well-appearing, comfortable, cooperative  HENT:     Head: Normocephalic and atraumatic.  Eyes:     General:        Right eye: No discharge.        Left eye: No discharge.     Conjunctiva/sclera: Conjunctivae normal.     Pupils:  Pupils are equal, round, and reactive to light.  Neck:     Thyroid: No thyromegaly.     Vascular: No carotid bruit.  Cardiovascular:     Rate and Rhythm: Normal rate and regular rhythm.     Pulses: Normal pulses.     Heart sounds: Normal heart sounds. No murmur heard. Pulmonary:     Effort: Pulmonary effort is normal. No respiratory distress.     Breath sounds: Normal breath sounds. No wheezing or rales.  Abdominal:     General: Bowel sounds are normal. There is no distension.     Palpations: Abdomen is soft. There is no mass.     Tenderness: There is no abdominal tenderness.  Musculoskeletal:        General: No tenderness. Normal range of motion.     Cervical back: Normal range of motion and neck supple.     Right lower leg: No edema.     Left lower leg: No edema.     Comments: Upper / Lower Extremities: - Normal muscle tone, strength bilateral upper extremities 5/5, lower extremities 5/5  Lymphadenopathy:     Cervical: No cervical adenopathy.  Skin:    General: Skin is warm and dry.     Findings: No erythema or rash.  Neurological:     Mental Status: He is alert and oriented to person, place, and time.     Comments: Distal sensation intact to light touch all extremities  Psychiatric:        Mood and Affect: Mood normal.        Behavior: Behavior normal.        Thought Content: Thought content normal.     Comments: Well groomed, good eye contact, normal speech and thoughts     Diabetic Foot Exam - Simple   Simple Foot Form Diabetic Foot exam was performed  with the following findings: Yes 11/30/2021  4:16 PM  Visual Inspection See comments: Yes Sensation Testing Intact to touch and monofilament testing bilaterally: Yes Pulse Check Posterior Tibialis and Dorsalis pulse intact bilaterally: Yes Comments Dry skin with callus formation. Intact monofilament.      Results for orders placed or performed in visit on 11/22/21  VITAMIN D 25 Hydroxy (Vit-D Deficiency,  Fractures)  Result Value Ref Range   Vit D, 25-Hydroxy 36 30 - 100 ng/mL  TSH  Result Value Ref Range   TSH 2.63 0.40 - 4.50 mIU/L  PSA  Result Value Ref Range   PSA 0.72 < OR = 4.00 ng/mL  Hemoglobin A1c  Result Value Ref Range   Hgb A1c MFr Bld 6.9 (H) <5.7 % of total Hgb   Mean Plasma Glucose 151 mg/dL   eAG (mmol/L) 8.4 mmol/L  Lipid panel  Result Value Ref Range   Cholesterol 111 <200 mg/dL   HDL 40 > OR = 40 mg/dL   Triglycerides 134 <150 mg/dL   LDL Cholesterol (Calc) 49 mg/dL (calc)   Total CHOL/HDL Ratio 2.8 <5.0 (calc)   Non-HDL Cholesterol (Calc) 71 <130 mg/dL (calc)  CBC with Differential/Platelet  Result Value Ref Range   WBC 9.4 3.8 - 10.8 Thousand/uL   RBC 5.26 4.20 - 5.80 Million/uL   Hemoglobin 14.8 13.2 - 17.1 g/dL   HCT 45.4 38.5 - 50.0 %   MCV 86.3 80.0 - 100.0 fL   MCH 28.1 27.0 - 33.0 pg   MCHC 32.6 32.0 - 36.0 g/dL   RDW 13.6 11.0 - 15.0 %   Platelets 184 140 - 400 Thousand/uL   MPV 10.6 7.5 - 12.5 fL   Neutro Abs 6,129 1,500 - 7,800 cells/uL   Lymphs Abs 2,181 850 - 3,900 cells/uL   Absolute Monocytes 761 200 - 950 cells/uL   Eosinophils Absolute 263 15 - 500 cells/uL   Basophils Absolute 66 0 - 200 cells/uL   Neutrophils Relative % 65.2 %   Total Lymphocyte 23.2 %   Monocytes Relative 8.1 %   Eosinophils Relative 2.8 %   Basophils Relative 0.7 %  COMPLETE METABOLIC PANEL WITH GFR  Result Value Ref Range   Glucose, Bld 117 (H) 65 - 99 mg/dL   BUN 17 7 - 25 mg/dL   Creat 1.12 0.70 - 1.35 mg/dL   eGFR 71 > OR = 60 mL/min/1.51m   BUN/Creatinine Ratio SEE NOTE: 6 - 22 (calc)   Sodium 139 135 - 146 mmol/L   Potassium 4.2 3.5 - 5.3 mmol/L   Chloride 103 98 - 110 mmol/L   CO2 27 20 - 32 mmol/L   Calcium 9.3 8.6 - 10.3 mg/dL   Total Protein 6.7 6.1 - 8.1 g/dL   Albumin 4.1 3.6 - 5.1 g/dL   Globulin 2.6 1.9 - 3.7 g/dL (calc)   AG Ratio 1.6 1.0 - 2.5 (calc)   Total Bilirubin 0.4 0.2 - 1.2 mg/dL   Alkaline phosphatase (APISO) 35 35 - 144 U/L    AST 19 10 - 35 U/L   ALT 14 9 - 46 U/L      Assessment & Plan:   Problem List Items Addressed This Visit     Hyperlipidemia associated with type 2 diabetes mellitus ([place]    Mostly Controlled cholesterol on statin lifestyle  Plan: 1. Continue current meds - Rosuvastatin 224mdaily 2. Not on ASA 3. Encourage improved lifestyle - low carb/cholesterol, reduce portion size, continue improving regular exercise  Relevant Medications   metFORMIN (GLUCOPHAGE-XR) 500 MG 24 hr tablet   Morbid obesity ([place])   Relevant Medications   metFORMIN (GLUCOPHAGE-XR) 500 MG 24 hr tablet   Pulmonary fibrosis ([place])    Followed by Paul Johnston w/ CT surveillance On Esbriet      Type 2 diabetes mellitus with other specified complication ([place])    Well-controlled DM with K4Q 6.9 Complications - hyperlipidemia, obesity, GERD - increases risk of future cardiovascular complications  On ARB - Had mild elevated BP but home readings all normal.  Plan:  1. Switch Metformin IR 500 daily to XR daily for better results, less GI side effect 2. Encourage improved lifestyle - low carb, low sugar diet, reduce portion size, continue improving regular exercise 3. Check CBG , bring log to next visit for review 4. Continue Statin, ARB low dose      Relevant Medications   metFORMIN (GLUCOPHAGE-XR) 500 MG 24 hr tablet   Vitamin D deficiency   Other Visit Diagnoses     Annual physical exam    -  Primary   Needs flu shot       Relevant Orders   Flu Vaccine QUAD High Dose(Fluad) (Completed)       Updated Health Maintenance information Reviewed recent lab results with patient Encouraged improvement to lifestyle with diet and exercise Goal of weight loss  High Dose Flu Shot today  Please message or call when ready - to do Nurse Visit for TDap vaccine and or / Shingrix (2 doses, 2-6 month apart)  Future Cologuard, let me know.  Next LDCT Repeat scan 12/07/21 - repeat low dose,  from April 2023.  Your high resolution CT was done 07/2021 - please check with Pulmonology when you see them on 11/15  what their future plan is  Please message me in December when ready for new refills for the year, and remind me we can change the Metformin to XR extended 534m once daily with supper.  Orders Placed This Encounter  Procedures   Flu Vaccine QUAD High Dose(Fluad)     No orders of the defined types were placed in this encounter.    Follow up plan: Return in about 6 months (around 06/01/2022) for 6 month follow-up DM A1c, updates from Pulm.   ANobie Putnam [facility] 11/30/2021, 3:54 PM

## 2021-11-30 NOTE — Assessment & Plan Note (Signed)
Mostly Controlled cholesterol on statin lifestyle  Plan: 1. Continue current meds - Rosuvastatin '20mg'$  daily 2. Not on ASA 3. Encourage improved lifestyle - low carb/cholesterol, reduce portion size, continue improving regular exercise

## 2021-12-03 DIAGNOSIS — H04123 Dry eye syndrome of bilateral lacrimal glands: Secondary | ICD-10-CM | POA: Diagnosis not present

## 2021-12-03 DIAGNOSIS — H1045 Other chronic allergic conjunctivitis: Secondary | ICD-10-CM | POA: Diagnosis not present

## 2021-12-03 DIAGNOSIS — H2513 Age-related nuclear cataract, bilateral: Secondary | ICD-10-CM | POA: Diagnosis not present

## 2021-12-03 DIAGNOSIS — H04129 Dry eye syndrome of unspecified lacrimal gland: Secondary | ICD-10-CM | POA: Diagnosis not present

## 2021-12-03 DIAGNOSIS — Z0100 Encounter for examination of eyes and vision without abnormal findings: Secondary | ICD-10-CM | POA: Diagnosis not present

## 2021-12-03 DIAGNOSIS — H40013 Open angle with borderline findings, low risk, bilateral: Secondary | ICD-10-CM | POA: Diagnosis not present

## 2021-12-03 DIAGNOSIS — D239 Other benign neoplasm of skin, unspecified: Secondary | ICD-10-CM | POA: Diagnosis not present

## 2021-12-03 DIAGNOSIS — E119 Type 2 diabetes mellitus without complications: Secondary | ICD-10-CM | POA: Diagnosis not present

## 2021-12-03 LAB — HM DIABETES EYE EXAM

## 2021-12-07 ENCOUNTER — Ambulatory Visit
Admission: RE | Admit: 2021-12-07 | Discharge: 2021-12-07 | Disposition: A | Discharge status: Home / Self Care | Payer: Medicare HMO | Source: Ambulatory Visit | Attending: Acute Care | Admitting: Acute Care

## 2021-12-07 DIAGNOSIS — J849 Interstitial pulmonary disease, unspecified: Secondary | ICD-10-CM | POA: Diagnosis not present

## 2021-12-07 DIAGNOSIS — J439 Emphysema, unspecified: Secondary | ICD-10-CM | POA: Diagnosis not present

## 2021-12-07 DIAGNOSIS — Z122 Encounter for screening for malignant neoplasm of respiratory organs: Secondary | ICD-10-CM | POA: Diagnosis present

## 2021-12-07 DIAGNOSIS — Z87891 Personal history of nicotine dependence: Secondary | ICD-10-CM | POA: Insufficient documentation

## 2021-12-07 DIAGNOSIS — R918 Other nonspecific abnormal finding of lung field: Secondary | ICD-10-CM | POA: Diagnosis not present

## 2021-12-21 ENCOUNTER — Telehealth: Payer: Self-pay | Admitting: *Deleted

## 2021-12-21 DIAGNOSIS — R911 Solitary pulmonary nodule: Secondary | ICD-10-CM

## 2021-12-21 DIAGNOSIS — Z87891 Personal history of nicotine dependence: Secondary | ICD-10-CM

## 2021-12-21 NOTE — Telephone Encounter (Signed)
CT results were reviewed by Eric Form, NP and Dr Patsey Berthold. Dr Patsey Berthold recommends repeating CT in 6 months to follow up on the nodule that had grown from 7.3 mm to 8.4 mm. Pt is aware that we will call him closer to the 6 mth to schedule follow up. I advised pt that Dr Vaughan Browner will also review the CT results with him at his ov on 12/26/2021. Pt verbalized understanding and had no further questions. Order placed for 6 mth f/u CT. CT results also sent to PCP with follow up plans included.

## 2021-12-26 ENCOUNTER — Ambulatory Visit: Payer: Medicare HMO | Admitting: Pulmonary Disease

## 2021-12-26 ENCOUNTER — Encounter: Payer: Self-pay | Admitting: Pulmonary Disease

## 2021-12-26 VITALS — BP 124/60 | HR 78 | Temp 97.7°F | Ht 69.0 in | Wt 243.4 lb

## 2021-12-26 DIAGNOSIS — J849 Interstitial pulmonary disease, unspecified: Secondary | ICD-10-CM | POA: Diagnosis not present

## 2021-12-26 DIAGNOSIS — Z5181 Encounter for therapeutic drug level monitoring: Secondary | ICD-10-CM

## 2021-12-26 DIAGNOSIS — R0602 Shortness of breath: Secondary | ICD-10-CM | POA: Diagnosis not present

## 2021-12-26 NOTE — Progress Notes (Signed)
Paul Johnston    [id number]    08/05/1950  Primary Care Physician:[name], Devonne Doughty, DO  Referring Physician: Olin Hauser, DO [address] [place],  [place] [address]  Chief complaint: Follow-up for IPF Started Esbriet in August 2023  HPI: 71 y.o.  with history of hypertension, diabetes, hyperlipidemia, sleep apnea. Previously seen by Dr. Annamaria Boots in 2018 for OSA,  AHA 14.7. CPAP suggested but he never got started on it  He recently had a low-dose screening CT that showed prominent pulmonary fibrosis and has been referred here for further evaluation States that he has mild dyspnea on exertion, no cough, congestion  Pets: dogs Occupation: used to work as an Customer service manager.  Exposures:Previously exposed to cotton dust and metal grinding ILD questionnaire 07/03/21 - as above Smoking history: 37 pack year smoker. Quit in 2010 Travel history: no significant travel history Relevant family history: father and grandfather had emphysema. There were smokers.  Interim history: He is tolerating Esbriet well Labs last month are within normal limits Dyspnea on exertion is stable  Outpatient Encounter Medications as of 12/26/2021  Medication Sig   Cholecalciferol (VITAMIN D3) 50 MCG (2000 UT) TABS Take 2,000 Units by mouth daily.   ESBRIET 267 MG TABS Take 3 tablets (801 mg total) by mouth 3 (three) times daily with meals.   Lancets (ONETOUCH ULTRASOFT) lancets Use as instructed   losartan (COZAAR) 25 MG tablet Take 1 tablet (25 mg total) by mouth daily.   metFORMIN (GLUCOPHAGE-XR) 500 MG 24 hr tablet Take 1 tablet (500 mg total) by mouth daily with supper.   omeprazole (PRILOSEC) 40 MG capsule Take 1 capsule (40 mg total) by mouth daily before breakfast.   ONETOUCH VERIO test strip Check blood sugar up to 2 times daily as instructed   rosuvastatin (CRESTOR) 20 MG tablet Take 1 tablet (20 mg total) by mouth daily.   No facility-administered encounter  medications on file as of 12/26/2021.   Physical Exam: Blood pressure 124/60, pulse 78, temperature 97.7 F (36.5 C), temperature source Oral, height '5\' 9"'$  (1.753 m), weight 243 lb 6.4 oz (110.4 kg), SpO2 96 %. Gen:      No acute distress HEENT:  EOMI, sclera anicteric Neck:     No masses; no thyromegaly Lungs:    Bibasal crackles CV:         Regular rate and rhythm; no murmurs Abd:      + bowel sounds; soft, non-tender; no palpable masses, no distension Ext:    No edema; adequate peripheral perfusion Skin:      Warm and dry; no rash Neuro: alert and oriented x 3 Psych: normal mood and affect   Data Reviewed: Imaging: Screening CT chest 05/14/21 - Paraseptal emphysema. Lower lung predominant reticular capacities, pulmonary nodules.   High-resolution CT 07/13/2021-emphysema, subpleural reticulation, groundglass and traction bronchiectasis with basilar predominance.  Probable UIP pattern.  8 mm left lower lobe lung  Screening CT chest 12/07/2021-mild emphysema, stable pulmonary nodule, stable interstitial lung disease I have reviewed the images personally  PFTs: 09/11/2021 FVC 2.03 [70%], FEV1 2.41 [76%], F/F 79, TLC 5.68 [83%], DLCO 13.43 [53%] Moderate diffusion defect  Labs: CTD serologies 07/05/2021-positive only for double-stranded DNA  Assessment:  Evaluation for interstitial lung disease I reviewed a CT scan that shows changes that could be probable UIP pattern of pulmonary fibrosis. He does not have any signs and symptoms of connective tissue disease and minimal exposures. 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  We reviewed treatment options and have decided to start treatment with pirfenidone Hepatic panel in October 2023 is within normal limits  Left lower lobe lung nodule Continue low-dose screening CT.  I had recently reviewed recent  imaging personally.  Obstructive sleep apnea Discussed his diagnosis of sleep apnea. He is currently not on treatment with CPAP He would like to hold off reordering the home sleep study for now until we have a better idea of his interstitial lung disease  Plan/Recommendations: Labs for monitoring Follow-up in 3 months.  Marshell Garfinkel MD [facility] 12/26/2021, 2:06 PM  CC: [name] *

## 2021-12-26 NOTE — Patient Instructions (Signed)
I am glad you are stable with your breathing I will send to pharmacy to give you a single pill of pirfenidone 3 times a day Will order CMP and proBNP for dyspnea in 3 months Follow-up in 3 months

## 2021-12-27 ENCOUNTER — Telehealth: Payer: Self-pay | Admitting: Pharmacist

## 2021-12-27 DIAGNOSIS — J849 Interstitial pulmonary disease, unspecified: Secondary | ICD-10-CM

## 2021-12-27 MED ORDER — ESBRIET 801 MG PO TABS: 801.0000 mg | ORAL_TABLET | Freq: Three times a day (TID) | ORAL | 1 refills | Status: DC

## 2021-12-27 NOTE — Telephone Encounter (Addendum)
CMET on 11/23/21 wnl  Rx for Esbriet '801mg'$  tablet three times daily sent to [place] patient to advise.  Knox Saliva, PharmD, MPH, BCPS, CPP Clinical Pharmacist (Rheumatology and Pulmonology)  ----- Message from Marshell Garfinkel, MD sent at 12/26/2021  2:22 PM EST ----- Can you send him a prescription for a single tablet of Esbriet 801 mg 3 times a day.  Thank you.  Can I get the accurate urine

## 2021-12-30 ENCOUNTER — Other Ambulatory Visit: Payer: Self-pay | Admitting: Family Medicine

## 2021-12-30 DIAGNOSIS — E1169 Type 2 diabetes mellitus with other specified complication: Secondary | ICD-10-CM

## 2021-12-30 DIAGNOSIS — K219 Gastro-esophageal reflux disease without esophagitis: Secondary | ICD-10-CM

## 2021-12-31 ENCOUNTER — Other Ambulatory Visit: Payer: Self-pay | Admitting: Family Medicine

## 2021-12-31 DIAGNOSIS — K219 Gastro-esophageal reflux disease without esophagitis: Secondary | ICD-10-CM

## 2021-12-31 DIAGNOSIS — E1169 Type 2 diabetes mellitus with other specified complication: Secondary | ICD-10-CM

## 2021-12-31 NOTE — Telephone Encounter (Signed)
Requested Prescriptions  Pending Prescriptions Disp Refills   omeprazole (PRILOSEC) 40 MG capsule [Pharmacy Med Name: OMEPRAZOLE DR 40 MG CAPSULE] 90 capsule 3    Sig: TAKE 1 CAPSULE BY MOUTH EVERY DAY BEFORE BREAKFAST     Gastroenterology: Proton Pump Inhibitors Passed - 12/30/2021  9:01 PM      Passed - Valid encounter within last 12 months    Recent Outpatient Visits           1 month ago Annual physical exam   [place], Devonne Doughty, DO   7 months ago Type 2 diabetes mellitus with other specified complication, without long-term current use of insulin ([place])   [facility] Olin Hauser, DO   1 year ago Herpes zoster without complication   [facility] [place], Coralie Keens, NP   1 year ago Annual physical exam   [facility] Olin Hauser, DO   1 year ago Type 2 diabetes mellitus with other specified complication, without long-term current use of insulin ([place])   [facility] [place], Devonne Doughty, DO       Future Appointments             In 5 months [place], Devonne Doughty, DO [facility], [facility]             rosuvastatin (CRESTOR) 20 MG tablet [Pharmacy Med Name: ROSUVASTATIN CALCIUM 20 MG TAB] 90 tablet 3    Sig: TAKE 1 TABLET BY MOUTH EVERY DAY     Cardiovascular:  Antilipid - Statins 2 Failed - 12/30/2021  9:01 PM      Failed - Lipid Panel in normal range within the last 12 months    Cholesterol, Total  Date Value Ref Range Status  06/09/2015 120 100 - 199 mg/dL Final  11/18/2012 102 <200 mg/dL Final   Cholesterol  Date Value Ref Range Status  11/23/2021 111 <200 mg/dL Final   LDL (calc)  Date Value Ref Range Status  11/18/2012 44 <100 mg/dL Final    Comment:    LDL-C is inaccurate if patient is nonfasting.   Reference Range: ---------------- Optimal:            <100 Near/Above Optimal: 100-129 Borderline High:    130-159 High:                160-189 Very High:          >=190       LDL Cholesterol (Calc)  Date Value Ref Range Status  11/23/2021 49 mg/dL (calc) Final    Comment:    Reference range: <100 . Desirable range <100 mg/dL for primary prevention;   <70 mg/dL for patients with CHD or diabetic patients  with > or = 2 CHD risk factors. Marland Kitchen LDL-C is now calculated using the Martin-Hopkins  calculation, which is a validated novel method providing  better accuracy than the Friedewald equation in the  estimation of LDL-C.  Cresenciano Genre et al. Annamaria Helling. 0175;102(58): 2061-2068  (http://education.[facility].com/faq/FAQ164)    HDL-C  Date Value Ref Range Status  11/18/2012 41 >=40 mg/dL Final   HDL  Date Value Ref Range Status  11/23/2021 40 > OR = 40 mg/dL Final  06/09/2015 40 >39 mg/dL Final   Triglycerides  Date Value Ref Range Status  11/23/2021 134 <150 mg/dL Final  11/18/2012 83 <150 mg/dL Final         Passed - Cr in normal range and within  360 days    Creat  Date Value Ref Range Status  11/23/2021 1.12 0.70 - 1.35 mg/dL Final   Creatinine, Urine  Date Value Ref Range Status  05/09/2021 148 20 - 320 mg/dL Final         Passed - Patient is not pregnant      Passed - Valid encounter within last 12 months    Recent Outpatient Visits           1 month ago Annual physical exam   [facility] [place], Devonne Doughty, DO   7 months ago Type 2 diabetes mellitus with other specified complication, without long-term current use of insulin ([place])   [facility] Olin Hauser, DO   1 year ago Herpes zoster without complication   [facility] [place], Coralie Keens, NP   1 year ago Annual physical exam   [facility] Olin Hauser, DO   1 year ago Type 2 diabetes mellitus with other specified complication, without long-term current use of insulin ([place])   [place], DO        Future Appointments             In 5 months [place], Devonne Doughty, DO [facility], [facility]             metFORMIN (GLUCOPHAGE) 500 MG tablet [Pharmacy Med Name: METFORMIN HCL 500 MG TABLET] 180 tablet 3    Sig: TAKE 1 TABLET BY MOUTH 2 TIMES DAILY WITH A MEAL.     Endocrinology:  Diabetes - Biguanides Failed - 12/30/2021  9:01 PM      Failed - B12 Level in normal range and within 720 days    No results found for: "VITAMINB12"       Passed - Cr in normal range and within 360 days    Creat  Date Value Ref Range Status  11/23/2021 1.12 0.70 - 1.35 mg/dL Final   Creatinine, Urine  Date Value Ref Range Status  05/09/2021 148 20 - 320 mg/dL Final         Passed - HBA1C is between 0 and 7.9 and within 180 days    Hemoglobin A1C  Date Value Ref Range Status  12/17/2017 6.1  Final   HB A1C (BAYER DCA - WAIVED)  Date Value Ref Range Status  12/17/2017 6.1 <7.0 % Final    Comment:                                          Diabetic Adult            <7.0                                       Healthy Adult        4.3 - 5.7                                                           (DCCT/NGSP) American Diabetes Association's Summary of Glycemic Recommendations for Adults with Diabetes: Hemoglobin A1c <7.0%. More  stringent glycemic goals (A1c <6.0%) may further reduce complications at the cost of increased risk of hypoglycemia.    Hgb A1c MFr Bld  Date Value Ref Range Status  11/23/2021 6.9 (H) <5.7 % of total Hgb Final    Comment:    For someone without known diabetes, a hemoglobin A1c value of 6.5% or greater indicates that they may have  diabetes and this should be confirmed with a follow-up  test. . For someone with known diabetes, a value <7% indicates  that their diabetes is well controlled and a value  greater than or equal to 7% indicates suboptimal  control. A1c targets should be individualized based on  duration of diabetes, age, comorbid conditions,  and  other considerations. . Currently, no consensus exists regarding use of hemoglobin A1c for diagnosis of diabetes for children. .          Passed - eGFR in normal range and within 360 days    GFR, Est African American  Date Value Ref Range Status  10/25/2019 101 > OR = 60 mL/min/1.86m Final   GFR, Est Non African American  Date Value Ref Range Status  10/25/2019 87 > OR = 60 mL/min/1.717mFinal   eGFR  Date Value Ref Range Status  11/23/2021 71 > OR = 60 mL/min/1.737minal         Passed - Valid encounter within last 6 months    Recent Outpatient Visits           1 month ago Annual physical exam   [facility]   7 months ago Type 2 diabetes mellitus with other specified complication, without long-term current use of insulin ([facility] [facility]   1 year ago Herpes zoster without complication   [facility]   1 year ago Annual physical exam   [facility]   1 year ago Type 2 diabetes mellitus with other specified complication, without long-term current use of insulin ([place] [facility]O       Future Appointments             In 5 months KarParks RangerleDevonne DoughtyO [facility] normal limits and completed in the last 12 months    WBC  Date Value Ref Range Status  11/23/2021 9.4 3.8 - 10.8 Thousand/uL Final   RBC  Date Value Ref Range Status  11/23/2021 5.26 4.20 - 5.80 Million/uL Final   Hemoglobin  Date Value Ref Range Status  11/23/2021 14.8 13.2 - 17.1 g/dL Final  06/11/2017 14.4 13.0 - 17.7 g/dL Final   HCT  Date Value Ref Range Status  11/23/2021 45.4 38.5 - 50.0 % Final   Hematocrit  Date Value Ref Range Status  06/11/2017 43.6 37.5 - 51.0 % Final   MCHC  Date Value Ref Range  Status  11/23/2021 32.6 32.0 - 36.0 g/dL Final   [facility] Value Ref Range Status  11/23/2021 28.1 27.0 - 33.0 pg Final   MCV  Date Value Ref Range Status  11/23/2021 86.3 80.0 - 100.0 fL Final  06/11/2017 87 79 - 97 fL Final   Platelet Count, POC  Date Value Ref Range Status  06/02/2013 173.0 142 - 424 K/uL Final  RDW  Date Value Ref Range Status  11/23/2021 13.6 11.0 - 15.0 % Final  06/11/2017 14.2 12.3 - 15.4 % Final   RDW, POC  Date Value Ref Range Status  06/02/2013 13.8 % Final          losartan (COZAAR) 25 MG tablet [Pharmacy Med Name: LOSARTAN POTASSIUM 25 MG TAB] 90 tablet 3    Sig: TAKE 1 TABLET (25 MG TOTAL) BY MOUTH DAILY.     Cardiovascular:  Angiotensin Receptor Blockers Passed - 12/30/2021  9:01 PM      Passed - Cr in normal range and within 180 days    Creat  Date Value Ref Range Status  11/23/2021 1.12 0.70 - 1.35 mg/dL Final   Creatinine, Urine  Date Value Ref Range Status  05/09/2021 148 20 - 320 mg/dL Final         Passed - K in normal range and within 180 days    Potassium  Date Value Ref Range Status  11/23/2021 4.2 3.5 - 5.3 mmol/L Final         Passed - Patient is not pregnant      Passed - Last BP in normal range    BP Readings from Last 1 Encounters:  12/26/21 124/60         Passed - Valid encounter within last 6 months    Recent Outpatient Visits           1 month ago Annual physical exam   [place], DO   7 months ago Type 2 diabetes mellitus with other specified complication, without long-term current use of insulin [facility])   [facility] Olin Hauser, DO   1 year ago Herpes zoster without complication   [facility] [place], Coralie Keens, NP   1 year ago Annual physical exam   [facility] Olin Hauser, DO   1 year ago Type 2 diabetes mellitus with other specified complication, without long-term current use of  insulin ([place])   [facility] [place], Devonne Doughty, DO       Future Appointments             In 5 months [place], Devonne Doughty, DO [facility]

## 2022-01-01 NOTE — Telephone Encounter (Signed)
Unable to refill per protocol, Rx requests were refused, due to request is too soon. Will refuse duplicate request.  Requested Prescriptions  Pending Prescriptions Disp Refills   losartan (COZAAR) 25 MG tablet [Pharmacy Med Name: LOSARTAN POTASSIUM 25 MG TAB] 90 tablet 3    Sig: TAKE 1 TABLET (25 MG TOTAL) BY MOUTH DAILY.     Cardiovascular:  Angiotensin Receptor Blockers Passed - 12/31/2021 11:57 PM      Passed - Cr in normal range and within 180 days    Creat  Date Value Ref Range Status  11/23/2021 1.12 0.70 - 1.35 mg/dL Final   Creatinine, Urine  Date Value Ref Range Status  05/09/2021 148 20 - 320 mg/dL Final         Passed - K in normal range and within 180 days    Potassium  Date Value Ref Range Status  11/23/2021 4.2 3.5 - 5.3 mmol/L Final         Passed - Patient is not pregnant      Passed - Last BP in normal range    BP Readings from Last 1 Encounters:  12/26/21 124/60         Passed - Valid encounter within last 6 months    Recent Outpatient Visits           1 month ago Annual physical exam   [place], DO   7 months ago Type 2 diabetes mellitus with other specified complication, without long-term current use of insulin ([place])   [facility] Olin Hauser, DO   1 year ago Herpes zoster without complication   [facility] [place], Coralie Keens, NP   1 year ago Annual physical exam   [facility] Olin Hauser, DO   1 year ago Type 2 diabetes mellitus with other specified complication, without long-term current use of insulin ([place])   [facility] [place], Devonne Doughty, DO       Future Appointments             In 5 months [place], Devonne Doughty, DO [facility] [place], [facility]             rosuvastatin (CRESTOR) 20 MG tablet [Pharmacy Med Name: ROSUVASTATIN CALCIUM 20 MG TAB] 90 tablet 3    Sig: TAKE 1 TABLET BY MOUTH EVERY  DAY     Cardiovascular:  Antilipid - Statins 2 Failed - 12/31/2021 11:57 PM      Failed - Lipid Panel in normal range within the last 12 months    Cholesterol, Total  Date Value Ref Range Status  06/09/2015 120 100 - 199 mg/dL Final  11/18/2012 102 <200 mg/dL Final   Cholesterol  Date Value Ref Range Status  11/23/2021 111 <200 mg/dL Final   LDL (calc)  Date Value Ref Range Status  11/18/2012 44 <100 mg/dL Final    Comment:    LDL-C is inaccurate if patient is nonfasting.   Reference Range: ---------------- Optimal:            <100 Near/Above Optimal: 100-129 Borderline High:    130-159 High:               160-189 Very High:          >=190       LDL Cholesterol (Calc)  Date Value Ref Range Status  11/23/2021 49 mg/dL (calc) Final    Comment:    Reference  range: <100 . Desirable range <100 mg/dL for primary prevention;   <70 mg/dL for patients with CHD or diabetic patients  with > or = 2 CHD risk factors. Marland Kitchen LDL-C is now calculated using the Martin-Hopkins  calculation, which is a validated novel method providing  better accuracy than the Friedewald equation in the  estimation of LDL-C.  Cresenciano Genre et al. Annamaria Helling. 4585;929(24): 2061-2068  (http://education.[facility].com/faq/FAQ164)    HDL-C  Date Value Ref Range Status  11/18/2012 41 >=40 mg/dL Final   HDL  Date Value Ref Range Status  11/23/2021 40 > OR = 40 mg/dL Final  06/09/2015 40 >39 mg/dL Final   Triglycerides  Date Value Ref Range Status  11/23/2021 134 <150 mg/dL Final  11/18/2012 83 <150 mg/dL Final         Passed - Cr in normal range and within 360 days    Creat  Date Value Ref Range Status  11/23/2021 1.12 0.70 - 1.35 mg/dL Final   Creatinine, Urine  Date Value Ref Range Status  05/09/2021 148 20 - 320 mg/dL Final         Passed - Patient is not pregnant      Passed - Valid encounter within last 12 months    Recent Outpatient Visits           1 month ago Annual physical exam    [place], DO   7 months ago Type 2 diabetes mellitus with other specified complication, without long-term current use of insulin ([place])   [facility] Olin Hauser, DO   1 year ago Herpes zoster without complication   [facility] [place], Coralie Keens, NP   1 year ago Annual physical exam   [facility] [place], Devonne Doughty, DO   1 year ago Type 2 diabetes mellitus with other specified complication, without long-term current use of insulin ([place])   [facility] [place], Devonne Doughty, DO       Future Appointments             In 5 months [place], Devonne Doughty, DO [facility]             omeprazole (PRILOSEC) 40 MG capsule [Pharmacy Med Name: OMEPRAZOLE DR 40 MG CAPSULE] 90 capsule 3    Sig: TAKE 1 CAPSULE BY MOUTH [place]     Gastroenterology: Proton Pump Inhibitors Passed - 12/31/2021 11:57 PM      Passed - Valid encounter within last 12 months    Recent Outpatient Visits           1 month ago Annual physical exam   [place], DO   7 months ago Type 2 diabetes mellitus with other specified complication, without long-term current use of insulin ([place])   [facility] Olin Hauser, DO   1 year ago Herpes zoster without complication   [facility] [place], Coralie Keens, NP   1 year ago Annual physical exam   [facility] Olin Hauser, DO   1 year ago Type 2 diabetes mellitus with other specified complication, without long-term current use of insulin ([place])   [facility] [place], Devonne Doughty, DO       Future Appointments             In 5 months [place], Devonne Doughty, DO [facility]             metFORMIN (GLUCOPHAGE) 500 MG tablet [Pharmacy Med Name: METFORMIN HCL  500 MG TABLET] 180 tablet 3    Sig: TAKE 1 TABLET BY MOUTH 2 TIMES DAILY WITH A MEAL.     Endocrinology:  Diabetes - Biguanides Failed - 12/31/2021 11:57 PM      Failed - B12 Level in normal range and within 720 days    No results found for: "VITAMINB12"       Passed - Cr in normal range and within 360 days    Creat  Date Value Ref Range Status  11/23/2021 1.12 0.70 - 1.35 mg/dL Final   Creatinine, Urine  Date Value Ref Range Status  05/09/2021 148 20 - 320 mg/dL Final         Passed - HBA1C is between 0 and 7.9 and within 180 days    Hemoglobin A1C  Date Value Ref Range Status  12/17/2017 6.1  Final   HB A1C (BAYER DCA - WAIVED)  Date Value Ref Range Status  12/17/2017 6.1 <7.0 % Final    Comment:                                          Diabetic Adult            <7.0                                       Healthy Adult        4.3 - 5.7                                                           (DCCT/NGSP) American Diabetes Association's Summary of Glycemic Recommendations for Adults with Diabetes: Hemoglobin A1c <7.0%. More stringent glycemic goals (A1c <6.0%) may further reduce complications at the cost of increased risk of hypoglycemia.    Hgb A1c MFr Bld  Date Value Ref Range Status  11/23/2021 6.9 (H) <5.7 % of total Hgb Final    Comment:    For someone without known diabetes, a hemoglobin A1c value of 6.5% or greater indicates that they may have  diabetes and this should be confirmed with a follow-up  test. . For someone with known diabetes, a value <7% indicates  that their diabetes is well controlled and a value  greater than or equal to 7% indicates suboptimal  control. A1c targets should be individualized based on  duration of diabetes, age, comorbid conditions, and  other considerations. . Currently, no consensus exists regarding use of hemoglobin A1c for diagnosis of diabetes for children. .          Passed - eGFR in normal range and within 360  days    GFR, Est African American  Date Value Ref Range Status  10/25/2019 101 > OR = 60 mL/min/1.43m Final   GFR, Est Non African American  Date Value Ref Range Status  10/25/2019 87 > OR = 60 mL/min/1.710mFinal   eGFR  Date Value Ref Range Status  11/23/2021 71 > OR = 60 mL/min/1.738minal  Passed - Valid encounter within last 6 months    Recent Outpatient Visits           1 month ago Annual physical exam   [facility] [place], Devonne Doughty, DO   7 months ago Type 2 diabetes mellitus with other specified complication, without long-term current use of insulin ([place])   [facility] Olin Hauser, DO   1 year ago Herpes zoster without complication   [facility] [place], Coralie Keens, NP   1 year ago Annual physical exam   [facility] Olin Hauser, DO   1 year ago Type 2 diabetes mellitus with other specified complication, without long-term current use of insulin ([place])   [place], DO       Future Appointments             In 5 months [place], Devonne Doughty, DO [facility] [place] within normal limits and completed in the last 12 months    WBC  Date Value Ref Range Status  11/23/2021 9.4 3.8 - 10.8 Thousand/uL Final   RBC  Date Value Ref Range Status  11/23/2021 5.26 4.20 - 5.80 Million/uL Final   Hemoglobin  Date Value Ref Range Status  11/23/2021 14.8 13.2 - 17.1 g/dL Final  06/11/2017 14.4 13.0 - 17.7 g/dL Final   HCT  Date Value Ref Range Status  11/23/2021 45.4 38.5 - 50.0 % Final   Hematocrit  Date Value Ref Range Status  06/11/2017 43.6 37.5 - 51.0 % Final   MCHC  Date Value Ref Range Status  11/23/2021 32.6 32.0 - 36.0 g/dL Final   [facility]  Date Value Ref Range Status  11/23/2021 28.1 27.0 - 33.0 pg Final   MCV  Date Value Ref Range Status  11/23/2021 86.3 80.0 - 100.0 fL  Final  06/11/2017 87 79 - 97 fL Final   Platelet Count, POC  Date Value Ref Range Status  06/02/2013 173.0 142 - 424 K/uL Final   RDW  Date Value Ref Range Status  11/23/2021 13.6 11.0 - 15.0 % Final  06/11/2017 14.2 12.3 - 15.4 % Final   RDW, POC  Date Value Ref Range Status  06/02/2013 13.8 % Final

## 2022-01-03 ENCOUNTER — Other Ambulatory Visit: Payer: Self-pay | Admitting: Family Medicine

## 2022-01-03 DIAGNOSIS — E1169 Type 2 diabetes mellitus with other specified complication: Secondary | ICD-10-CM

## 2022-01-04 ENCOUNTER — Encounter: Payer: Self-pay | Admitting: Family Medicine

## 2022-01-04 NOTE — Telephone Encounter (Signed)
Medication changed. Requested Prescriptions  Pending Prescriptions Disp Refills   metFORMIN (GLUCOPHAGE) 500 MG tablet [Pharmacy Med Name: METFORMIN HCL 500 MG TABLET] 180 tablet 3    Sig: TAKE 1 TABLET BY MOUTH 2 TIMES DAILY WITH A MEAL.     Endocrinology:  Diabetes - Biguanides Failed - 01/03/2022  2:01 AM      Failed - B12 Level in normal range and within 720 days    No results found for: "VITAMINB12"       Passed - Cr in normal range and within 360 days    Creat  Date Value Ref Range Status  11/23/2021 1.12 0.70 - 1.35 mg/dL Final   Creatinine, Urine  Date Value Ref Range Status  05/09/2021 148 20 - 320 mg/dL Final         Passed - HBA1C is between 0 and 7.9 and within 180 days    Hemoglobin A1C  Date Value Ref Range Status  12/17/2017 6.1  Final   HB A1C (BAYER DCA - WAIVED)  Date Value Ref Range Status  12/17/2017 6.1 <7.0 % Final    Comment:                                          Diabetic Adult            <7.0                                       Healthy Adult        4.3 - 5.7                                                           (DCCT/NGSP) American Diabetes Association's Summary of Glycemic Recommendations for Adults with Diabetes: Hemoglobin A1c <7.0%. More stringent glycemic goals (A1c <6.0%) may further reduce complications at the cost of increased risk of hypoglycemia.    Hgb A1c MFr Bld  Date Value Ref Range Status  11/23/2021 6.9 (H) <5.7 % of total Hgb Final    Comment:    For someone without known diabetes, a hemoglobin A1c value of 6.5% or greater indicates that they may have  diabetes and this should be confirmed with a follow-up  test. . For someone with known diabetes, a value <7% indicates  that their diabetes is well controlled and a value  greater than or equal to 7% indicates suboptimal  control. A1c targets should be individualized based on  duration of diabetes, age, comorbid conditions, and  other considerations. . Currently,  no consensus exists regarding use of hemoglobin A1c for diagnosis of diabetes for children. .          Passed - eGFR in normal range and within 360 days    GFR, Est African American  Date Value Ref Range Status  10/25/2019 101 > OR = 60 mL/min/1.81m Final   GFR, Est Non African American  Date Value Ref Range Status  10/25/2019 87 > OR = 60 mL/min/1.764mFinal   eGFR  Date Value Ref Range Status  11/23/2021 71 > OR = 60 mL/min/1.7339minal  Passed - Valid encounter within last 6 months    Recent Outpatient Visits           1 month ago Annual physical exam   [facility] [place], Devonne Doughty, DO   8 months ago Type 2 diabetes mellitus with other specified complication, without long-term current use of insulin ([place])   [facility] Olin Hauser, DO   1 year ago Herpes zoster without complication   [facility] [place], Coralie Keens, NP   1 year ago Annual physical exam   [facility] Olin Hauser, DO   1 year ago Type 2 diabetes mellitus with other specified complication, without long-term current use of insulin ([place])   [place], DO       Future Appointments             In 5 months [place], Devonne Doughty, DO [facility] [place] within normal limits and completed in the last 12 months    WBC  Date Value Ref Range Status  11/23/2021 9.4 3.8 - 10.8 Thousand/uL Final   RBC  Date Value Ref Range Status  11/23/2021 5.26 4.20 - 5.80 Million/uL Final   Hemoglobin  Date Value Ref Range Status  11/23/2021 14.8 13.2 - 17.1 g/dL Final  06/11/2017 14.4 13.0 - 17.7 g/dL Final   HCT  Date Value Ref Range Status  11/23/2021 45.4 38.5 - 50.0 % Final   Hematocrit  Date Value Ref Range Status  06/11/2017 43.6 37.5 - 51.0 % Final   MCHC  Date Value Ref Range Status  11/23/2021 32.6 32.0 - 36.0 g/dL  Final   [facility]  Date Value Ref Range Status  11/23/2021 28.1 27.0 - 33.0 pg Final   MCV  Date Value Ref Range Status  11/23/2021 86.3 80.0 - 100.0 fL Final  06/11/2017 87 79 - 97 fL Final   Platelet Count, POC  Date Value Ref Range Status  06/02/2013 173.0 142 - 424 K/uL Final   RDW  Date Value Ref Range Status  11/23/2021 13.6 11.0 - 15.0 % Final  06/11/2017 14.2 12.3 - 15.4 % Final   RDW, POC  Date Value Ref Range Status  06/02/2013 13.8 % Final

## 2022-01-08 ENCOUNTER — Other Ambulatory Visit: Payer: Self-pay

## 2022-01-08 DIAGNOSIS — K219 Gastro-esophageal reflux disease without esophagitis: Secondary | ICD-10-CM

## 2022-01-08 DIAGNOSIS — E1169 Type 2 diabetes mellitus with other specified complication: Secondary | ICD-10-CM

## 2022-01-08 MED ORDER — ROSUVASTATIN CALCIUM 20 MG PO TABS: 20.0000 mg | ORAL_TABLET | Freq: Every day | ORAL | 3 refills | Status: DC

## 2022-01-08 MED ORDER — METFORMIN HCL ER 500 MG PO TB24: 500.0000 mg | ORAL_TABLET | Freq: Every day | ORAL | 3 refills | Status: DC

## 2022-01-08 MED ORDER — LOSARTAN POTASSIUM 25 MG PO TABS: 25.0000 mg | ORAL_TABLET | Freq: Every day | ORAL | 3 refills | Status: DC

## 2022-01-08 MED ORDER — OMEPRAZOLE 40 MG PO CPDR: 40.0000 mg | DELAYED_RELEASE_CAPSULE | Freq: Every day | ORAL | 3 refills | Status: DC

## 2022-03-05 DIAGNOSIS — H40013 Open angle with borderline findings, low risk, bilateral: Secondary | ICD-10-CM | POA: Diagnosis not present

## 2022-03-05 DIAGNOSIS — H2513 Age-related nuclear cataract, bilateral: Secondary | ICD-10-CM | POA: Diagnosis not present

## 2022-03-05 DIAGNOSIS — E119 Type 2 diabetes mellitus without complications: Secondary | ICD-10-CM | POA: Diagnosis not present

## 2022-03-05 DIAGNOSIS — D23112 Other benign neoplasm of skin of right lower eyelid, including canthus: Secondary | ICD-10-CM | POA: Diagnosis not present

## 2022-03-05 DIAGNOSIS — H1045 Other chronic allergic conjunctivitis: Secondary | ICD-10-CM | POA: Diagnosis not present

## 2022-03-08 ENCOUNTER — Ambulatory Visit (INDEPENDENT_AMBULATORY_CARE_PROVIDER_SITE_OTHER): Payer: Medicare HMO

## 2022-03-08 VITALS — BP 120/60 | Ht 69.0 in | Wt 242.8 lb

## 2022-03-08 DIAGNOSIS — K219 Gastro-esophageal reflux disease without esophagitis: Secondary | ICD-10-CM | POA: Insufficient documentation

## 2022-03-08 DIAGNOSIS — R197 Diarrhea, unspecified: Secondary | ICD-10-CM | POA: Insufficient documentation

## 2022-03-08 DIAGNOSIS — Z0000 Encounter for general adult medical examination without abnormal findings: Secondary | ICD-10-CM | POA: Diagnosis not present

## 2022-03-08 DIAGNOSIS — Z1211 Encounter for screening for malignant neoplasm of colon: Secondary | ICD-10-CM | POA: Insufficient documentation

## 2022-03-08 DIAGNOSIS — R141 Gas pain: Secondary | ICD-10-CM | POA: Insufficient documentation

## 2022-03-08 HISTORY — DX: Encounter for screening for malignant neoplasm of colon: Z12.11

## 2022-03-08 NOTE — Progress Notes (Signed)
Subjective:   Marquavis Hannen is a 72 y.o. male who presents for Medicare Annual/Subsequent preventive examination.  Review of Systems     Cardiac Risk Factors include: advanced age (>29mn, >>73women);diabetes mellitus;male gender;sedentary lifestyle     Objective:    Today's Vitals   03/08/22 1435  BP: 120/60  Weight: 242 lb 12.8 oz (110.1 kg)  Height: '5\' 9"'$  (1.753 m)   Body mass index is 35.86 kg/m.     03/08/2022    2:42 PM 03/02/2021    2:56 PM 10/27/2018    2:35 PM 07/13/2012   12:58 PM  Advanced Directives  Does Patient Have a Medical Advance Directive? No No No Patient does not have advance directive  Would patient like information on creating a medical advance directive? No - Patient declined No - Patient declined No - Patient declined     Current Medications (verified) Outpatient Encounter Medications as of 03/08/2022  Medication Sig   Cholecalciferol (VITAMIN D3) 50 MCG (2000 UT) TABS Take 2,000 Units by mouth daily.   ESBRIET 801 MG TABS Take 801 mg by mouth 3 (three) times daily with meals.   Lancets (ONETOUCH ULTRASOFT) lancets Use as instructed   losartan (COZAAR) 25 MG tablet Take 1 tablet (25 mg total) by mouth daily.   metFORMIN (GLUCOPHAGE-XR) 500 MG 24 hr tablet Take 1 tablet (500 mg total) by mouth daily with supper.   omeprazole (PRILOSEC) 40 MG capsule Take 1 capsule (40 mg total) by mouth daily before breakfast.   ONETOUCH VERIO test strip Check blood sugar up to 2 times daily as instructed   rosuvastatin (CRESTOR) 20 MG tablet Take 1 tablet (20 mg total) by mouth daily.   No facility-administered encounter medications on file as of 03/08/2022.    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   HERNIA REPAIR  2014?   Umbilical hernia. Mesh installed.   INSERTION OF MESH N/A 07/16/2012   Procedure: INSERTION OF MESH;  Surgeon: DHarl Bowie MD;  Location: [facility]  Service: General;  Laterality: N/A;   ORBITAL FRACTURE SURGERY     UMBILICAL HERNIA REPAIR N/A 07/16/2012   Procedure: HERNIA REPAIR UMBILICAL WITH MESH;  Surgeon: DHarl Bowie MD;  Location: [facility]  Service: General;  Laterality: N/A;   UPPER GI ENDOSCOPY     Family History  Problem Relation Age of Onset   Emphysema Father        died age 555  Heart failure Father    COPD Father    Cancer Mother    Heart disease Paternal Grandfather    COPD Paternal Grandfather    Social History   Socioeconomic History   Marital status: Divorced    Spouse name: Not on file   Number of children: 2   Years of education: Not on file   Highest education level: Not on file  Occupational History    Employer: [employer]  Tobacco Use   Smoking status: Former    Packs/day: 1.00    Years: 33.00    Total pack years: 33.00    Types: Cigarettes    Quit date: 09/06/2008    Years since quitting: 13.5   Smokeless tobacco: Former  Scientific laboratory technician Use: Never used  Substance and Sexual Activity   Alcohol use: No   Drug use: No   Sexual activity: Not on file  Other Topics Concern   Not on file  Social History Narrative   Two natural children and three step.  Lives with wife.    Social Determinants of Health   Financial Resource Strain: Low Risk  (03/08/2022)   Overall Financial Resource Strain (CARDIA)    Difficulty of Paying Living Expenses: Not hard at all  Food Insecurity: No Food Insecurity (03/08/2022)   Hunger Vital Sign     Worried About Running Out of Food in the Last Year: Never true    Ran Out of Food in the Last Year: Never true  Transportation Needs: No Transportation Needs (03/08/2022)   PRAPARE - Hydrologist (Medical): No    Lack of Transportation (Non-Medical): No  Physical Activity: Inactive (03/08/2022)   Exercise Vital Sign    Days of Exercise per Week: 0 days    Minutes of Exercise per Session: 0 min  Stress: No Stress Concern Present (03/08/2022)   [place]    Feeling of Stress : Not at all  Social Connections: Socially Isolated (03/08/2022)   Social Connection and Isolation Panel [NHANES]    Frequency of Communication with Friends and Family: More than three times a week    Frequency of Social Gatherings with Friends and Family: More than three times a week    Attends Religious Services: Never    Marine scientist or Organizations: No    Attends Music therapist: Never    Marital Status: Divorced    Tobacco Counseling Counseling given: Not Answered   Clinical Intake:  Pre-visit preparation completed: Yes  Pain : No/denies pain     Nutritional Risks: None Diabetes: Yes CBG done?: No Did pt. bring in CBG monitor from home?: No  How often do you need to have someone help you when you read instructions, pamphlets, or other written materials from your doctor or pharmacy?: 1 - Never  Diabetic?yes Nutrition Risk Assessment:  Has the patient had any N/V/D within the last 2 months?  Yes  Does the patient have any non-healing wounds?  No  Has the patient had any unintentional weight loss or weight gain?  No   Diabetes:  Is the patient diabetic?  Yes  If diabetic, was a CBG obtained today?  No  Did the patient bring in their glucometer from home?  No  How often do you monitor your CBG's? Did it for a while, now A1C stable and not checking as much.   Financial Strains  and Diabetes Management:  Are you having any financial strains with the device, your supplies or your medication? No .  Does the patient want to be seen by Chronic Care Management for management of their diabetes?  No  Would the patient like to be referred to a Nutritionist or for Diabetic Management?  No   Diabetic Exams:  Diabetic Eye Exam: Completed 12/03/21. Marland Kitchen Pt has been advised about the importance  in completing this exam.  Diabetic Foot Exam: Completed 11/30/21. Pt has been advised about the importance in completing this exam.     Interpreter Needed?: No  Information entered by :: Kirke Shaggy, LPN   Activities of Daily Living    03/08/2022    2:44 PM 03/04/2022    3:43 PM  In your present state of health, do you have any difficulty performing the following activities:  Hearing? 0 0  Vision? 0 0  Difficulty concentrating or making decisions? 0 0  Walking or climbing stairs? 1 0  Dressing or bathing? 0 0  Doing errands, shopping? 0 0  Preparing Food and eating ? N N  Using the Toilet? N N  In the past six months, have you accidently leaked urine? N N  Do you have problems with loss of bowel control? N N  Managing your Medications? N N  Managing your Finances? N N  Housekeeping or managing your Housekeeping? N N    Patient Care Team: Olin Hauser, DO as PCP - General (Family Medicine)  Indicate any recent Medical Services you may have received from other than [facility] providers in the past year (date may be approximate).     Assessment:   This is a routine wellness examination for [place].  Hearing/Vision screen Hearing Screening - Comments:: No aids Vision Screening - Comments:: Readers- Dr. Ellin Mayhew  Dietary issues and exercise activities discussed: Current Exercise Habits: The patient does not participate in regular exercise at present, Exercise limited by: respiratory conditions(s)   Goals Addressed             This Visit's Progress    DIET -  INCREASE WATER INTAKE         Depression Screen    03/08/2022    2:39 PM 11/30/2021    3:45 PM 03/02/2021    2:54 PM 11/01/2020    2:15 PM 11/01/2019    2:13 PM 05/28/2019    1:41 PM 10/27/2018    2:28 PM  PHQ 2/9 Scores  PHQ - 2 Score '1 1 1 1 '$ 0 0 0  PHQ- 9 Score '3 3  3       '$ Fall Risk    03/08/2022    2:43 PM 03/04/2022    3:43 PM 11/30/2021    3:44 PM 03/02/2021    2:58 PM 11/01/2020    2:15 PM  Fall Risk   Falls in the past year? 0 0 0 0 0  Number falls in past yr: 0  0 0 0  Injury with Fall? 0  0 0   Risk for fall due to : No Fall Risks  No Fall Risks No Fall Risks   Follow up Falls prevention discussed;Falls evaluation completed  Falls evaluation completed Falls evaluation completed Falls evaluation completed    FALL RISK PREVENTION PERTAINING TO THE HOME:  Any stairs in or around the home? Yes  If so, are there any without handrails? Yes  Home free of loose throw rugs in walkways, pet beds, electrical cords, etc? Yes  Adequate lighting in your home to reduce risk of falls? Yes   ASSISTIVE DEVICES UTILIZED TO PREVENT FALLS:  Life alert? No  Use of a cane, walker or w/c? No  Grab bars in the bathroom? No  Shower chair or bench in shower? No  Elevated toilet seat or a handicapped toilet? No   TIMED UP AND GO:  Was the test performed? Yes .  Length of time to ambulate  10 feet: 4 sec.   Gait steady and fast without use of assistive device  Cognitive Function:        03/08/2022    2:58 PM 10/27/2018    2:36 PM  6CIT Screen  What Year? 0 points 0 points  What month? 0 points 0 points  What time? 0 points 0 points  Count back from 20 0 points 0 points  Months in reverse 0 points 0 points  Repeat phrase 2 points 0 points  Total Score 2 points 0 points    Immunizations Immunization History  Administered Date(s) Administered   COVID-19, mRNA, vaccine(Comirnaty)12 years and older 11/26/2021   Fluad Quad(high Dose 65+) 12/08/2018, 12/09/2019, 11/28/2020,  11/30/2021   Influenza, High Dose Seasonal PF 12/12/2016, 12/17/2017   Influenza,inj,Quad PF,6+ Mos 11/18/2012, 12/21/2015   PFIZER Comirnaty(Gray Top)Covid-19 Tri-Sucrose Vaccine 06/29/2020   PFIZER(Purple Top)SARS-COV-2 Vaccination 03/20/2019, 04/10/2019, 11/18/2019   Pfizer Covid-19 Vaccine Bivalent Booster 33yr & up 11/17/2020   Pfizer Covid-19 Vaccine Bivalent Booster 5y-11y 11/26/2021   Pneumococcal Conjugate-13 12/02/2012   Pneumococcal Polysaccharide-23 06/11/2017   Respiratory Syncytial Virus Vaccine,Recomb Aduvanted(Arexvy) 11/26/2021   Rsv, Bivalent, Protein Subunit Rsvpref,pf (Evans Lance 11/26/2021   Td 11/20/2004, 12/21/2010   Tdap 12/21/2010    TDAP status: Due, Education has been provided regarding the importance of this vaccine. Advised may receive this vaccine at local pharmacy or Health Dept. Aware to provide a copy of the vaccination record if obtained from local pharmacy or Health Dept. Verbalized acceptance and understanding.  Flu Vaccine status: Up to date  Pneumococcal vaccine status: Up to date  Covid-19 vaccine status: Completed vaccines  Qualifies for Shingles Vaccine? Yes   Zostavax completed No   Shingrix Completed?: No.    Education has been provided regarding the importance of this vaccine. Patient has been advised to call insurance company to determine out of pocket expense if they have not yet received this vaccine. Advised may also receive vaccine at local pharmacy or Health Dept. Verbalized acceptance and understanding.  Screening Tests Health Maintenance  Topic Date Due   Zoster Vaccines- Shingrix (1 of 2) Never done   DTaP/Tdap/Td (4 - Td or Tdap) 12/20/2020   COVID-19 Vaccine (7 - 2023-24 season) 01/21/2022   Diabetic kidney evaluation - Urine ACR  05/10/2022   HEMOGLOBIN A1C  05/25/2022   Diabetic kidney evaluation - eGFR measurement  11/24/2022   FOOT EXAM  12/01/2022   OPHTHALMOLOGY EXAM  12/04/2022   Lung Cancer Screening  12/08/2022    Medicare Annual Wellness (AWV)  03/09/2023   COLONOSCOPY (Pts 45-479yrInsurance coverage will need to be confirmed)  03/15/2025   Pneumonia Vaccine 6567Years old  Completed   INFLUENZA VACCINE  Completed   Hepatitis C Screening  Completed   HPV VACCINES  Aged Out    Health Maintenance  Health Maintenance Due  Topic Date Due   Zoster Vaccines- Shingrix (1 of 2) Never done   DTaP/Tdap/Td (4 - Td or Tdap) 12/20/2020   COVID-19 Vaccine (7 - 2023-24 season) 01/21/2022    Colorectal cancer screening: Type of screening: Colonoscopy. Completed 03/16/15. Repeat every 10 years  Lung Cancer Screening: (Low Dose CT Chest recommended if Age 57110-80ears, 30 pack-year currently smoking OR have quit w/in 15years.) does qualify.   Lung Cancer Screening Referral: has pulmonary fibrosis- had CT in October 2023  Additional Screening:  Hepatitis C Screening: does qualify; Completed 01/11/15  Vision Screening: Recommended annual ophthalmology exams for early detection of glaucoma and other disorders of  the eye. Is the patient up to date with their annual eye exam?  Yes  Who is the provider or what is the name of the office in which the patient attends annual eye exams? Dr.Woodard If pt is not established with a provider, would they like to be referred to a provider to establish care? No .   Dental Screening: Recommended annual dental exams for proper oral hygiene  Community Resource Referral / Chronic Care Management: CRR required this visit?  No   CCM required this visit?  No      Plan:     I have personally reviewed and noted the following in the patient's chart:   Medical and social history Use of alcohol, tobacco or illicit drugs  Current medications and supplements including opioid prescriptions. Patient is not currently taking opioid prescriptions. Functional ability and status Nutritional status Physical activity Advanced directives List of other physicians Hospitalizations,  surgeries, and ER visits in previous 12 months Vitals Screenings to include cognitive, depression, and falls Referrals and appointments  In addition, I have reviewed and discussed with patient certain preventive protocols, quality metrics, and best practice recommendations. A written personalized care plan for preventive services as well as general preventive health recommendations were provided to patient.     Dionisio David, LPN   9/89/2119   Nurse Notes: none

## 2022-03-08 NOTE — Patient Instructions (Addendum)
Paul Johnston , Thank you for taking time to come for your Medicare Wellness Visit. I appreciate your ongoing commitment to your health goals. Please review the following plan we discussed and let me know if I can assist you in the future.   These are the goals we discussed:  Goals      DIET - EAT MORE FRUITS AND VEGETABLES     DIET - INCREASE WATER INTAKE        This is a list of the screening recommended for you and due dates:  Health Maintenance  Topic Date Due   Zoster (Shingles) Vaccine (1 of 2) Never done   DTaP/Tdap/Td vaccine (4 - Td or Tdap) 12/20/2020   COVID-19 Vaccine (7 - 2023-24 season) 01/21/2022   Yearly kidney health urinalysis for diabetes  05/10/2022   Hemoglobin A1C  05/25/2022   Yearly kidney function blood test for diabetes  11/24/2022   Complete foot exam   12/01/2022   Eye exam for diabetics  12/04/2022   Screening for Lung Cancer  12/08/2022   Medicare Annual Wellness Visit  03/09/2023   Colon Cancer Screening  03/15/2025   Pneumonia Vaccine  Completed   Flu Shot  Completed   Hepatitis C Screening: USPSTF Recommendation to screen - Ages 18-79 yo.  Completed   HPV Vaccine  Aged Out    Advanced directives: no  Conditions/risks identified: none  Next appointment: Follow up in one year for your annual wellness visit 03/14/23 @ 3:00 pm in person   Preventive Care 65 Years and Older, Male Preventive care refers to lifestyle choices and visits with your health care provider that can promote health and wellness. What does preventive care include? A yearly physical exam. This is also called an annual well check. Dental exams once or twice a year. Routine eye exams. Ask your health care provider how often you should have your eyes checked. Personal lifestyle choices, including: Daily care of your teeth and gums. Regular physical activity. Eating a healthy diet. Avoiding tobacco and drug use. Limiting alcohol use. Practicing safe sex. Taking low-dose  aspirin every day. Taking vitamin and mineral supplements as recommended by your health care provider. What happens during an annual well check? The services and screenings done by your health care provider during your annual well check will depend on your age, overall health, lifestyle risk factors, and family history of disease. Counseling  Your health care provider may ask you questions about your: Alcohol use. Tobacco use. Drug use. Emotional well-being. Home and relationship well-being. Sexual activity. Eating habits. History of falls. Memory and ability to understand (cognition). Work and work Statistician. Reproductive health. Screening  You may have the following tests or measurements: Height, weight, and BMI. Blood pressure. Lipid and cholesterol levels. These may be checked every 5 years, or more frequently if you are over 21 years old. Skin check. Lung cancer screening. You may have this screening every year starting at age 44 if you have a 30-pack-year history of smoking and currently smoke or have quit within the past 15 years. Fecal occult blood test (FOBT) of the stool. You may have this test every year starting at age 51. Flexible sigmoidoscopy or colonoscopy. You may have a sigmoidoscopy every 5 years or a colonoscopy every 10 years starting at age 57. Hepatitis C blood test. Hepatitis B blood test. Sexually transmitted disease (STD) testing. Diabetes screening. This is done by checking your blood sugar (glucose) after you have not eaten for a while (fasting).  You may have this done every 1-3 years. Bone density scan. This is done to screen for osteoporosis. You may have this done starting at age 44. Mammogram. This may be done every 1-2 years. Talk to your health care provider about how often you should have regular mammograms. Talk with your health care provider about your test results, treatment options, and if necessary, the need for more tests. Vaccines  Your  health care provider may recommend certain vaccines, such as: Influenza vaccine. This is recommended every year. Tetanus, diphtheria, and acellular pertussis (Tdap, Td) vaccine. You may need a Td booster every 10 years. Zoster vaccine. You may need this after age 70. Pneumococcal 13-valent conjugate (PCV13) vaccine. One dose is recommended after age 70. Pneumococcal polysaccharide (PPSV23) vaccine. One dose is recommended after age 40. Talk to your health care provider about which screenings and vaccines you need and how often you need them. This information is not intended to replace advice given to you by your health care provider. Make sure you discuss any questions you have with your health care provider. Document Released: 02/24/2015 Document Revised: 10/18/2015 Document Reviewed: 11/29/2014 Elsevier Interactive Patient Education  2017 [place] Prevention in the Home Falls can cause injuries. They can happen to people of all ages. There are many things you can do to make your home safe and to help prevent falls. What can I do on the outside of my home? Regularly fix the edges of walkways and driveways and fix any cracks. Remove anything that might make you trip as you walk through a door, such as a raised step or threshold. Trim any bushes or trees on the path to your home. Use bright outdoor lighting. Clear any walking paths of anything that might make someone trip, such as rocks or tools. Regularly check to see if handrails are loose or broken. Make sure that both sides of any steps have handrails. Any raised decks and porches should have guardrails on the edges. Have any leaves, snow, or ice cleared regularly. Use sand or salt on walking paths during winter. Clean up any spills in your garage right away. This includes oil or grease spills. What can I do in the bathroom? Use night lights. Install grab bars by the toilet and in the tub and shower. Do not use towel bars as  grab bars. Use non-skid mats or decals in the tub or shower. If you need to sit down in the shower, use a plastic, non-slip stool. Keep the floor dry. Clean up any water that spills on the floor as soon as it happens. Remove soap buildup in the tub or shower regularly. Attach bath mats securely with double-sided non-slip rug tape. Do not have throw rugs and other things on the floor that can make you trip. What can I do in the bedroom? Use night lights. Make sure that you have a light by your bed that is easy to reach. Do not use any sheets or blankets that are too big for your bed. They should not hang down onto the floor. Have a firm chair that has side arms. You can use this for support while you get dressed. Do not have throw rugs and other things on the floor that can make you trip. What can I do in the kitchen? Clean up any spills right away. Avoid walking on wet floors. Keep items that you use a lot in easy-to-reach places. If you need to reach something above you, use a  strong step stool that has a grab bar. Keep electrical cords out of the way. Do not use floor polish or wax that makes floors slippery. If you must use wax, use non-skid floor wax. Do not have throw rugs and other things on the floor that can make you trip. What can I do with my stairs? Do not leave any items on the stairs. Make sure that there are handrails on both sides of the stairs and use them. Fix handrails that are broken or loose. Make sure that handrails are as long as the stairways. Check any carpeting to make sure that it is firmly attached to the stairs. Fix any carpet that is loose or worn. Avoid having throw rugs at the top or bottom of the stairs. If you do have throw rugs, attach them to the floor with carpet tape. Make sure that you have a light switch at the top of the stairs and the bottom of the stairs. If you do not have them, ask someone to add them for you. What else can I do to help prevent  falls? Wear shoes that: Do not have high heels. Have rubber bottoms. Are comfortable and fit you well. Are closed at the toe. Do not wear sandals. If you use a stepladder: Make sure that it is fully opened. Do not climb a closed stepladder. Make sure that both sides of the stepladder are locked into place. Ask someone to hold it for you, if possible. Clearly mark and make sure that you can see: Any grab bars or handrails. First and last steps. Where the edge of each step is. Use tools that help you move around (mobility aids) if they are needed. These include: Canes. Walkers. Scooters. Crutches. Turn on the lights when you go into a dark area. Replace any light bulbs as soon as they burn out. Set up your furniture so you have a clear path. Avoid moving your furniture around. If any of your floors are uneven, fix them. If there are any pets around you, be aware of where they are. Review your medicines with your doctor. Some medicines can make you feel dizzy. This can increase your chance of falling. Ask your doctor what other things that you can do to help prevent falls. This information is not intended to replace advice given to you by your health care provider. Make sure you discuss any questions you have with your health care provider. Document Released: 11/24/2008 Document Revised: 07/06/2015 Document Reviewed: 03/04/2014 Elsevier Interactive Patient Education  2017 [employer].

## 2022-03-18 ENCOUNTER — Telehealth: Payer: Self-pay | Admitting: Pulmonary Disease

## 2022-03-18 ENCOUNTER — Telehealth: Payer: Self-pay | Admitting: Pharmacist

## 2022-03-18 DIAGNOSIS — J849 Interstitial pulmonary disease, unspecified: Secondary | ICD-10-CM

## 2022-03-18 MED ORDER — ESBRIET 801 MG PO TABS: 801.0000 mg | ORAL_TABLET | Freq: Three times a day (TID) | ORAL | 0 refills | Status: DC

## 2022-03-18 NOTE — Telephone Encounter (Signed)
Pt scheduled for 04/03/22 

## 2022-03-18 NOTE — Telephone Encounter (Signed)
Refill sent for ESBRIET to [facility] ([facility]) for Esbriet: [phone]  Dose: 801 mg three times daily  Last OV: 12/26/2021 Provider: Dr. Vaughan Browner  Next OV: not schedued but now due  Knox Saliva, PharmD, MPH, BCPS Clinical Pharmacist (Rheumatology and Pulmonology)

## 2022-03-20 ENCOUNTER — Other Ambulatory Visit: Payer: Self-pay | Admitting: Family Medicine

## 2022-03-20 DIAGNOSIS — J8410 Pulmonary fibrosis, unspecified: Secondary | ICD-10-CM

## 2022-03-20 NOTE — Telephone Encounter (Signed)
Notified pt that he could have blood work drawn at PCP. Confirmed that orders were in place. Pt stated understanding. Nothing further needed at this time.

## 2022-03-20 NOTE — Telephone Encounter (Signed)
Yes he can get these labs at his primary care.  We had ordered a comprehensive metabolic panel and proBNP for dyspnea.

## 2022-03-20 NOTE — Telephone Encounter (Signed)
Patient has called to see if he can complete necessary blood work at [facility] office. Pt has stated this is closer to his home. Dr. Vaughan Browner please advise

## 2022-03-22 ENCOUNTER — Other Ambulatory Visit: Payer: Self-pay

## 2022-03-22 DIAGNOSIS — J8410 Pulmonary fibrosis, unspecified: Secondary | ICD-10-CM

## 2022-03-25 ENCOUNTER — Other Ambulatory Visit: Payer: 59

## 2022-03-25 DIAGNOSIS — J8410 Pulmonary fibrosis, unspecified: Secondary | ICD-10-CM | POA: Diagnosis not present

## 2022-03-25 DIAGNOSIS — R0600 Dyspnea, unspecified: Secondary | ICD-10-CM | POA: Diagnosis not present

## 2022-03-26 LAB — COMPLETE METABOLIC PANEL WITH GFR
AG Ratio: 1.3 (calc) (ref 1.0–2.5)
ALT: 21 U/L (ref 9–46)
AST: 24 U/L (ref 10–35)
Albumin: 4.2 g/dL (ref 3.6–5.1)
Alkaline phosphatase (APISO): 40 U/L (ref 35–144)
BUN: 12 mg/dL (ref 7–25)
CO2: 28 mmol/L (ref 20–32)
Calcium: 9.3 mg/dL (ref 8.6–10.3)
Chloride: 100 mmol/L (ref 98–110)
Creat: 1.03 mg/dL (ref 0.70–1.28)
Globulin: 3.2 g/dL (calc) (ref 1.9–3.7)
Glucose, Bld: 120 mg/dL — ABNORMAL HIGH (ref 65–99)
Potassium: 4.5 mmol/L (ref 3.5–5.3)
Sodium: 139 mmol/L (ref 135–146)
Total Bilirubin: 0.4 mg/dL (ref 0.2–1.2)
Total Protein: 7.4 g/dL (ref 6.1–8.1)
eGFR: 78 mL/min/{1.73_m2} (ref >=60)

## 2022-03-26 LAB — BRAIN NATRIURETIC PEPTIDE: Brain Natriuretic Peptide: 36 pg/mL (ref <100)

## 2022-04-03 ENCOUNTER — Encounter: Payer: Self-pay | Admitting: Pulmonary Disease

## 2022-04-03 ENCOUNTER — Ambulatory Visit: Payer: Medicare HMO | Admitting: Pulmonary Disease

## 2022-04-03 VITALS — BP 138/72 | HR 76 | Temp 97.6°F | Ht 69.0 in | Wt 241.2 lb

## 2022-04-03 DIAGNOSIS — J849 Interstitial pulmonary disease, unspecified: Secondary | ICD-10-CM

## 2022-04-03 DIAGNOSIS — Z5181 Encounter for therapeutic drug level monitoring: Secondary | ICD-10-CM

## 2022-04-03 NOTE — Progress Notes (Signed)
Paul Johnston    LG:[id number]    [birth year]  Primary Care Physician:Paul Johnston  Referring Physician: Olin Hauser, Johnston [address] [place],  [place] [address]  Chief complaint: Follow-up for IPF Started Esbriet in August 2023  HPI: 72 y.o.  with history of hypertension, diabetes, hyperlipidemia, sleep apnea. Previously seen by Paul Johnston in 2018 for OSA,  AHA 14.7. CPAP suggested but he never got started on it  He recently had a low-dose screening CT that showed prominent pulmonary fibrosis and has been referred here for further evaluation States that he has mild dyspnea on exertion, no cough, congestion  Pets: dogs Occupation: used to work as an Customer service manager.  Exposures:Previously exposed to cotton dust and metal grinding ILD questionnaire 07/03/21 - as above Smoking history: 37 pack year smoker. Quit in 2010 Travel history: no significant travel history Relevant family history: father and grandfather had emphysema. There were smokers.  Interim history: He is tolerating Esbriet well Labs earlier this month are stable Dyspnea on exertion is stable  Outpatient Encounter Medications as of 04/03/2022  Medication Sig   Cholecalciferol (VITAMIN D3) 50 MCG (2000 UT) TABS Take 2,000 Units by mouth daily.   ESBRIET 801 MG TABS Take 1 tablet (801 mg total) by mouth 3 (three) times daily with meals.   Lancets (ONETOUCH ULTRASOFT) lancets Use as instructed   losartan (COZAAR) 25 MG tablet Take 1 tablet (25 mg total) by mouth daily.   metFORMIN (GLUCOPHAGE-XR) 500 MG 24 hr tablet Take 1 tablet (500 mg total) by mouth daily with supper.   omeprazole (PRILOSEC) 40 MG capsule Take 1 capsule (40 mg total) by mouth daily before breakfast.   ONETOUCH VERIO test strip Check blood sugar up to 2 times daily as instructed   rosuvastatin (CRESTOR) 20 MG tablet Take 1 tablet (20 mg total) by mouth daily.   No facility-administered encounter medications on  file as of 04/03/2022.   Physical Exam: Blood pressure 138/72, pulse 76, temperature 97.6 F (36.4 C), temperature source Oral, height 5' 9"$  (1.753 m), weight 241 lb 3.2 oz (109.4 kg), SpO2 94 %. Gen:      No acute distress HEENT:  EOMI, sclera anicteric Neck:     No masses; no thyromegaly Lungs:    Clear to auscultation bilaterally; normal respiratory effort CV:         Regular rate and rhythm; no murmurs Abd:      + bowel sounds; soft, non-tender; no palpable masses, no distension Ext:    No edema; adequate peripheral perfusion Skin:      Warm and dry; no rash Neuro: alert and oriented x 3 Psych: normal mood and affect   Data Reviewed: Imaging: Screening CT chest 05/14/21 - Paraseptal emphysema. Lower lung predominant reticular capacities, pulmonary nodules.   High-resolution CT 07/13/2021-emphysema, subpleural reticulation, groundglass and traction bronchiectasis with basilar predominance.  Probable UIP pattern.  8 mm left lower lobe lung  Screening CT chest 12/07/2021-mild emphysema, stable pulmonary nodule, stable interstitial lung disease I have reviewed the images personally  PFTs: 09/11/2021 FVC 2.03 [70%], FEV1 2.41 [76%], F/F 79, TLC 5.68 [83%], DLCO 13.43 [53%] Moderate diffusion defect  Labs: CTD serologies 07/05/2021-positive only for double-stranded DNA  Hepatic panel 03/25/2022-within normal limits  Assessment:  Evaluation for interstitial lung disease I reviewed a CT scan that shows changes that could be probable UIP pattern of pulmonary fibrosis. He does not have any signs and symptoms of  connective tissue disease and minimal exposures. 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  I had recently reviewed recent  imaging personally.  Obstructive sleep apnea Discussed his diagnosis of sleep apnea. He is currently not on treatment with CPAP He would like to hold off reordering the home sleep study for now until we have a better idea of his interstitial lung disease  Plan/Recommendations: Labs for monitoring Continue Esbriet Follow-up high-res CT and PFTs in 4 months  Paul Garfinkel MD [facility] 04/03/2022, 1:38 PM  CC: [name] *

## 2022-04-03 NOTE — Patient Instructions (Signed)
I am glad you are stable with regard to breathing Will schedule high-res CT and PFTs in 4 months Return to clinic after these tests.

## 2022-06-05 ENCOUNTER — Other Ambulatory Visit: Payer: Self-pay | Admitting: Family Medicine

## 2022-06-05 ENCOUNTER — Ambulatory Visit (INDEPENDENT_AMBULATORY_CARE_PROVIDER_SITE_OTHER): Payer: 59 | Admitting: Family Medicine

## 2022-06-05 ENCOUNTER — Encounter: Payer: Self-pay | Admitting: Family Medicine

## 2022-06-05 VITALS — BP 136/68 | HR 71 | Ht 69.0 in | Wt 240.0 lb

## 2022-06-05 DIAGNOSIS — E1169 Type 2 diabetes mellitus with other specified complication: Secondary | ICD-10-CM

## 2022-06-05 DIAGNOSIS — N400 Enlarged prostate without lower urinary tract symptoms: Secondary | ICD-10-CM

## 2022-06-05 DIAGNOSIS — E785 Hyperlipidemia, unspecified: Secondary | ICD-10-CM | POA: Diagnosis not present

## 2022-06-05 DIAGNOSIS — J8410 Pulmonary fibrosis, unspecified: Secondary | ICD-10-CM

## 2022-06-05 DIAGNOSIS — E559 Vitamin D deficiency, unspecified: Secondary | ICD-10-CM

## 2022-06-05 DIAGNOSIS — Z0000 Encounter for general adult medical examination without abnormal findings: Secondary | ICD-10-CM

## 2022-06-05 LAB — POCT GLYCOSYLATED HEMOGLOBIN (HGB A1C): Hemoglobin A1C: 5.5 % (ref 4.0–5.6)

## 2022-06-05 NOTE — Assessment & Plan Note (Signed)
Well-controlled DM with A1c 5.5 improved Complications - hyperlipidemia, obesity, GERD - increases risk of future cardiovascular complications  On ARB - Had mild elevated BP but home readings all normal.  Plan:  1. Continue Metformin IR 500 daily to XR daily 2. Encourage improved lifestyle - low carb, low sugar diet, reduce portion size, continue improving regular exercise 3. Check CBG , bring log to next visit for review 4. Continue Statin, ARB low dose Urine microalbumin

## 2022-06-05 NOTE — Assessment & Plan Note (Signed)
Followed by [facility] Some progressive dyspnea, Pulse ox 93% Continues w/ CT surveillance LDCT then has upcoming PFT + High dose CT On Esbriet

## 2022-06-05 NOTE — Progress Notes (Signed)
Subjective:    Patient ID: Paul Johnston, male    DOB: [birth year], 72 y.o.   MRN: [id number]  Paul Johnston is a 72 y.o. male presenting on 06/05/2022 for Medical Management of Chronic Issues   HPI  Interstitial Lung Disease / Pulmonary FIbrosis Followed by Dr Isaiah Serge Upcoming LDCT 06/10/22 Next apt with Pulm Late June 2024, and will need PFT and High Dose CT as well. Admits increasing dyspnea lately, quicker. Consider Oxygen in future. Currently 93% Room air.   Vitamin D Deficiency Currently on Vitamin D3 5,000 and 2,000 intermittent Last lab mild low but still normal range   CHRONIC DM, Type 2 Morbid Obestiy BMI >35 A1c down to 5.5, prior range 6s CBGs: Infrequent checks, none recently. Meds: Metformin XR  once daily Had diarrhea on Metformin x 2 Reports good compliance. Tolerating well w/o side-effects Currently not on ACEi/ARB Lifestyle: Weight down 2 lbs few months - Diet (improving low carb DM diet)  - Exercise: limited - Dr Clydene Pugh DM Eye 12/03/20, upcoming visit Denies hypoglycemia, polyuria, visual changes, numbness or tingling.   HYPERLIPIDEMIA: - Reports no concerns. Last lipid panel 11/2021, controlled  - Currently taking Rosuvastatin , tolerating well without side effects or myalgias   Elevated BP Previous elevated on electronic cuff but improved on manual Home BP readings normal range. 120-130 / 60-70s Current Meds - Losartan  daily Denies CP, dyspnea, HA, edema, dizziness / lightheadedness    -------------------------   Health Maintenance:   PSA 0.72 (11/2021)   Upcoming LDCT screening 06/10/22  Future COVID Booster if interested.      03/08/2022    2:39 PM 11/30/2021    3:45 PM 03/02/2021    2:54 PM  Depression screen PHQ 2/9  Decreased Interest  1 1  Down, Depressed, Hopeless 1 0 0  PHQ - 2 Score Altered sleeping 1 1   Tired, decreased energy 1 1   Change in appetite  0   Feeling bad or failure about yourself   0    Trouble concentrating  0   Moving slowly or fidgety/restless  0   Suicidal thoughts  0   PHQ-9 Score 3 3   Difficult doing work/chores Not difficult at all Not difficult at all     Social History   Tobacco Use   Smoking status: Former    Packs/day: 1.00    Years: 33.00    Additional pack years: 0.00    Total pack years: 33.00    Types: Cigarettes    Quit date: 09/06/2008    Years since quitting: 13.7    Passive exposure: Past   Smokeless tobacco: Former  Building services engineer Use: Never used  Substance Use Topics   Alcohol use: No   Drug use: No    Review of Systems Per HPI unless specifically indicated above     Objective:    BP 136/68   Pulse 71   Ht  (1.753 m)   Wt 240 lb (108.9 kg)   SpO2 93%   BMI 35.44 kg/m   Wt Readings from Last 3 Encounters:  06/05/22 240 lb (108.9 kg)  04/03/22 241 lb 3.2 oz (109.4 kg)  03/08/22 242 lb 12.8 oz (110.1 kg)    Physical Exam Vitals and nursing note reviewed.  Constitutional:      General: He is not in acute distress.    Appearance: He is well-developed. He is not diaphoretic.     Comments: Well-appearing,  comfortable, cooperative  HENT:     Head: Normocephalic and atraumatic.  Eyes:     General:        Right eye: No discharge.        Left eye: No discharge.     Conjunctiva/sclera: Conjunctivae normal.  Neck:     Thyroid: No thyromegaly.  Cardiovascular:     Rate and Rhythm: Normal rate and regular rhythm.     Pulses: Normal pulses.     Heart sounds: Normal heart sounds. No murmur heard. Pulmonary:     Effort: Pulmonary effort is normal. No respiratory distress.     Breath sounds: Normal breath sounds. No wheezing or rales.  Musculoskeletal:        General: Normal range of motion.     Cervical back: Normal range of motion and neck supple.  Lymphadenopathy:     Cervical: No cervical adenopathy.  Skin:    General: Skin is warm and dry.     Findings: No erythema or rash.  Neurological:     Mental  Status: He is alert and oriented to person, place, and time. Mental status is at baseline.  Psychiatric:        Behavior: Behavior normal.     Comments: Well groomed, good eye contact, normal speech and thoughts       Results for orders placed or performed in visit on 06/05/22  POCT HgB A1C  Result Value Ref Range   Hemoglobin A1C 5.5 4.0 - 5.6 %   HbA1c POC (<> result, manual entry)     HbA1c, POC (prediabetic range)     HbA1c, POC (controlled diabetic range)        Assessment & Plan:   Problem List Items Addressed This Visit     BPH (benign prostatic hyperplasia)   Hyperlipidemia associated with type 2 diabetes mellitus    Mostly Controlled cholesterol on statin lifestyle  Plan: 1. Continue current meds - Rosuvastatin  daily 2. Not on ASA 3. Encourage improved lifestyle - low carb/cholesterol, reduce portion size, continue improving regular exercise      Pulmonary fibrosis    Followed by [facility] Some progressive dyspnea, Pulse ox 93% Continues w/ CT surveillance LDCT then has upcoming PFT + High dose CT On Esbriet      Type 2 diabetes mellitus with other specified complication - Primary    Well-controlled DM with A1c 5.5 improved Complications - hyperlipidemia, obesity, GERD - increases risk of future cardiovascular complications  On ARB - Had mild elevated BP but home readings all normal.  Plan:  1. Continue Metformin IR 500 daily to XR daily 2. Encourage improved lifestyle - low carb, low sugar diet, reduce portion size, continue improving regular exercise 3. Check CBG , bring log to next visit for review 4. Continue Statin, ARB low dose Urine microalbumin      Relevant Orders   POCT HgB A1C (Completed)   Urine Microalbumin w/creat. ratio    No orders of the defined types were placed in this encounter.     Follow up plan: Return in about 6 months (around 12/05/2022) for 6 month fasting lab only then 1 week later Annual  Physical.  Future labs ordered for 11/2022   Saralyn Pilar, DO [facility] 06/05/2022, 3:56 PM

## 2022-06-05 NOTE — Assessment & Plan Note (Signed)
Mostly Controlled cholesterol on statin lifestyle  Plan: 1. Continue current meds - Rosuvastatin 20mg daily 2. Not on ASA 3. Encourage improved lifestyle - low carb/cholesterol, reduce portion size, continue improving regular exercise 

## 2022-06-05 NOTE — Patient Instructions (Addendum)
Thank you for coming to the office today.  Recent Labs    07/05/21 0922 11/23/21 0857 06/05/22 1554  HGBA1C 6.8* 6.9* 5.5   BP readings are great Okay to space it out and do Bp check 1 x month is fine. If you have issues or concerns or symptoms, okay to check it sooner, and document.  No change to medications.  Keep on current Metformin XR  daily.  Refills later this year, 11/22/2022  Consider asking Dr Isaiah Serge about oxygen or any other treatment options after you do the testing.   DUE for FASTING BLOOD WORK (no food or drink after midnight before the lab appointment, only water or coffee without cream/sugar on the morning of)  SCHEDULE "Lab Only" visit in the morning at the clinic for lab draw in 6 MONTHS   - Make sure Lab Only appointment is at about 1 week before your next appointment, so that results will be available  For Lab Results, once available within 2-3 days of blood draw, you can can log in to MyChart online to view your results and a brief explanation. Also, we can discuss results at next follow-up visit.   Please schedule a Follow-up Appointment to: Return in about 6 months (around 12/05/2022) for 6 month fasting lab only then 1 week later Annual Physical.  If you have any other questions or concerns, please feel free to call the office or send a message through MyChart. You may also schedule an earlier appointment if necessary.  Additionally, you may be receiving a survey about your experience at our office within a few days to 1 week by e-mail or mail. We value your feedback.  Saralyn Pilar, DO [facility], [place]

## 2022-06-06 LAB — MICROALBUMIN / CREATININE URINE RATIO
Creatinine, Urine: 42 mg/dL (ref 20–320)
Microalb Creat Ratio: 12 mg/g creat (ref <30)
Microalb, Ur: 0.5 mg/dL

## 2022-06-10 ENCOUNTER — Ambulatory Visit: Admission: RE | Admit: 2022-06-10 | Payer: Medicare HMO | Source: Ambulatory Visit

## 2022-07-19 ENCOUNTER — Other Ambulatory Visit: Payer: Self-pay | Admitting: Pulmonary Disease

## 2022-07-19 DIAGNOSIS — J849 Interstitial pulmonary disease, unspecified: Secondary | ICD-10-CM

## 2022-07-23 ENCOUNTER — Ambulatory Visit: Payer: Medicare HMO

## 2022-07-29 ENCOUNTER — Ambulatory Visit
Admission: RE | Admit: 2022-07-29 | Discharge: 2022-07-29 | Disposition: A | Discharge status: Home / Self Care | Payer: Medicare HMO | Source: Ambulatory Visit | Attending: Pulmonary Disease | Admitting: Pulmonary Disease

## 2022-07-29 DIAGNOSIS — J479 Bronchiectasis, uncomplicated: Secondary | ICD-10-CM | POA: Diagnosis not present

## 2022-07-29 DIAGNOSIS — J432 Centrilobular emphysema: Secondary | ICD-10-CM | POA: Diagnosis not present

## 2022-07-29 DIAGNOSIS — J849 Interstitial pulmonary disease, unspecified: Secondary | ICD-10-CM | POA: Diagnosis not present

## 2022-08-06 ENCOUNTER — Ambulatory Visit: Payer: Medicare HMO

## 2022-08-13 ENCOUNTER — Ambulatory Visit: Payer: Medicare HMO | Attending: Pulmonary Disease

## 2022-08-13 DIAGNOSIS — J849 Interstitial pulmonary disease, unspecified: Secondary | ICD-10-CM | POA: Diagnosis not present

## 2022-08-13 MED ORDER — ALBUTEROL SULFATE (2.5 MG/3ML) 0.083% IN NEBU
2.5000 mg | INHALATION_SOLUTION | Freq: Once | RESPIRATORY_TRACT | Status: AC
  Administered 2022-08-13: 2.5 mg via RESPIRATORY_TRACT
  Filled 2022-08-13: qty 3

## 2022-08-19 ENCOUNTER — Telehealth: Payer: Self-pay

## 2022-08-19 NOTE — Telephone Encounter (Signed)
Received fax from [facility] stating that pt has been approved to continue receiving Esbriet free of charge in 2025. At this time no further action is required. Approval letter has been sent to scan center.

## 2022-08-26 LAB — PULMONARY FUNCTION TEST ARMC ONLY
DL/VA % pred: 56 %
DL/VA: 2.29 ml/min/mmHg/L
DLCO unc % pred: 41 %
DLCO unc: 10.35 ml/min/mmHg
FEF 25-75 Post: 2.93 L/sec
FEF 25-75 Pre: 2.31 L/sec
FEF2575-%Change-Post: 27 %
FEF2575-%Pred-Post: 123 %
FEF2575-%Pred-Pre: 96 %
FEV1-%Change-Post: 5 %
FEV1-%Pred-Post: 74 %
FEV1-%Pred-Pre: 70 %
FEV1-Post: 2.33 L
FEV1-Pre: 2.2 L
FEV1FVC-%Change-Post: 0 %
FEV1FVC-%Pred-Pre: 110 %
FEV6-%Change-Post: 6 %
FEV6-%Pred-Post: 71 %
FEV6-%Pred-Pre: 67 %
FEV6-Post: 2.87 L
FEV6-Pre: 2.7 L
FEV6FVC-%Pred-Post: 106 %
FEV6FVC-%Pred-Pre: 106 %
FVC-%Change-Post: 6 %
FVC-%Pred-Post: 67 %
FVC-%Pred-Pre: 63 %
FVC-Post: 2.87 L
FVC-Pre: 2.7 L
Post FEV1/FVC ratio: 81 %
Post FEV6/FVC ratio: 100 %
Pre FEV1/FVC ratio: 81 %
Pre FEV6/FVC Ratio: 100 %
RV % pred: 103 %
RV: 2.48 L
TLC % pred: 80 %
TLC: 5.49 L

## 2022-08-27 ENCOUNTER — Encounter: Payer: Self-pay | Admitting: Pulmonary Disease

## 2022-08-27 ENCOUNTER — Ambulatory Visit (INDEPENDENT_AMBULATORY_CARE_PROVIDER_SITE_OTHER): Payer: Medicare HMO | Admitting: Pulmonary Disease

## 2022-08-27 VITALS — BP 118/74 | HR 73 | Temp 97.7°F | Ht 69.0 in | Wt 235.2 lb

## 2022-08-27 DIAGNOSIS — J849 Interstitial pulmonary disease, unspecified: Secondary | ICD-10-CM | POA: Diagnosis not present

## 2022-08-27 DIAGNOSIS — R0602 Shortness of breath: Secondary | ICD-10-CM | POA: Diagnosis not present

## 2022-08-27 DIAGNOSIS — Z5181 Encounter for therapeutic drug level monitoring: Secondary | ICD-10-CM

## 2022-08-27 LAB — COMPREHENSIVE METABOLIC PANEL
ALT: 25 U/L (ref 0–53)
AST: 28 U/L (ref 0–37)
Albumin: 4.4 g/dL (ref 3.5–5.2)
Alkaline Phosphatase: 38 U/L — ABNORMAL LOW (ref 39–117)
BUN: 14 mg/dL (ref 6–23)
CO2: 33 mEq/L — ABNORMAL HIGH (ref 19–32)
Calcium: 10.1 mg/dL (ref 8.4–10.5)
Chloride: 101 mEq/L (ref 96–112)
Creatinine, Ser: 1.04 mg/dL (ref 0.40–1.50)
GFR: 72.64 mL/min (ref >60.00)
Glucose, Bld: 112 mg/dL — ABNORMAL HIGH (ref 70–99)
Potassium: 4.3 mEq/L (ref 3.5–5.1)
Sodium: 141 mEq/L (ref 135–145)
Total Bilirubin: 0.4 mg/dL (ref 0.2–1.2)
Total Protein: 7.8 g/dL (ref 6.0–8.3)

## 2022-08-27 NOTE — Progress Notes (Signed)
Paul Johnston    [id number]    Dec 28, 1950  Primary Care Physician:Paul Johnston  Referring Physician: [name], Johnston [address] [place],  [place] [address]  Chief complaint: Follow-up for IPF Started Esbriet in August 2023  HPI: 72 y.o.  with history of hypertension, diabetes, hyperlipidemia, sleep apnea. Previously seen by Paul Johnston in 2018 for OSA,  AHA 14.7. CPAP suggested but he never got started on it  He recently had a low-dose screening CT that showed prominent pulmonary fibrosis and has been referred here for further evaluation States that he has mild dyspnea on exertion, no cough, congestion  Pets: dogs Occupation: used to work as an Lexicographer.  Exposures:Previously exposed to cotton dust and metal grinding ILD questionnaire 07/03/21 - as above Smoking history: 37 pack year smoker. Quit in 2010 Travel history: no significant travel history Relevant family history: father and grandfather had emphysema. There were smokers.  Interim history: He is tolerating Esbriet well States that he feels that he is more short of breath over the past year Here for review of CT and PFTs.  Outpatient Encounter Medications as of 08/27/2022  Medication Sig   Cholecalciferol (VITAMIN D3) 50 MCG (2000 UT) TABS Take 2,000 Units by mouth daily.   ESBRIET 801 MG TABS Take 1 tablet (801 mg total) by mouth 3 (three) times daily with meals.   Lancets (ONETOUCH ULTRASOFT) lancets Use as instructed   losartan (COZAAR) 25 MG tablet Take 1 tablet (25 mg total) by mouth daily.   metFORMIN (GLUCOPHAGE-XR) 500 MG 24 hr tablet Take 1 tablet (500 mg total) by mouth daily with supper.   omeprazole (PRILOSEC) 40 MG capsule Take 1 capsule (40 mg total) by mouth daily before breakfast.   ONETOUCH VERIO test strip Check blood sugar up to 2 times daily as instructed   rosuvastatin (CRESTOR) 20 MG tablet Take 1 tablet (20 mg total) by mouth daily.   No  facility-administered encounter medications on file as of 08/27/2022.   Physical Exam: Blood pressure 118/74, pulse 73, temperature 97.7 F (36.5 C), temperature source Oral, height 5\' 9"  (1.753 m), weight 235 lb 3.2 oz (106.7 kg), SpO2 98%. Gen:      No acute distress HEENT:  EOMI, sclera anicteric Neck:     No masses; no thyromegaly Lungs:    Clear to auscultation bilaterally; normal respiratory effort CV:         Regular rate and rhythm; no murmurs Abd:      + bowel sounds; soft, non-tender; no palpable masses, no distension Ext:    No edema; adequate peripheral perfusion Skin:      Warm and dry; no rash Neuro: alert and oriented x 3 Psych: normal mood and affect   Data Reviewed: Imaging: Screening CT chest 05/14/21 - Paraseptal emphysema. Lower lung predominant reticular capacities, pulmonary nodules.   High-resolution CT 07/13/2021-emphysema, subpleural reticulation, groundglass and traction bronchiectasis with basilar predominance.  Probable UIP pattern.  8 mm left lower lobe lung  Screening CT chest 12/07/2021-mild emphysema, stable pulmonary nodule, stable interstitial lung disease  High resolution CT 07/29/2022-mild to moderate pulmonary fibrosis in UIP pattern with progression compared to before.  PFTs: 09/11/2021 FVC 2.03 [70%], FEV1 2.41 [76%], F/F 79, TLC 5.68 [83%], DLCO 13.43 [53%] Moderate diffusion defect  08/13/2022 FVC 2.87 [61%], FEV1 2.33 [94%], F/F81, TLC 5.49 [80%], DLCO 10.35 [41%] Moderate diffusion defect  Labs: CTD serologies 07/05/2021-positive only for double-stranded DNA  Hepatic panel 03/25/2022-within normal limits  Assessment:  Evaluation for interstitial lung disease I reviewed a CT scan that shows changes that could be probable UIP pattern of pulmonary fibrosis. He does not have any signs and symptoms of connective tissue disease and minimal exposures. 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  Recommended pulmonary rehab but he wants to exercise on his own Will check echocardiogram to evaluate for pulmonary hypertension Check labs for monitoring   Obstructive sleep apnea Discussed his diagnosis of sleep apnea. He is currently not on treatment with CPAP He would like to hold off reordering the home sleep study for now until we have a better idea of his interstitial lung disease  Plan/Recommendations: Labs for monitoring Continue Esbriet Echocardiogram  Paul Greathouse MD [facility] 08/27/2022, 1:28 PM  CC: Paul Johnston *

## 2022-08-27 NOTE — Patient Instructions (Signed)
Reviewed CT scan and lung function test which shows some progression of disease Continue the Esbriet Will get an echocardiogram to evaluate dyspnea Check comprehensive metabolic panel and proBNP today Follow-up in 6 months

## 2022-08-28 LAB — PRO B NATRIURETIC PEPTIDE: NT-Pro BNP: 115 pg/mL (ref 0–376)

## 2022-08-30 NOTE — Telephone Encounter (Signed)
Dr. Isaiah Serge, please advise on email from the pt, thanks!   Under the heading of assessment for today's visit you made the comment,  "We discussed clinical trial option and lung transplant but he is not interested in doing these."  I would like to know what your thoughts and professional opinion would be concerning the above 2 items considering my diagnosis and the level of my disease at this time? Thanks

## 2022-09-09 NOTE — Telephone Encounter (Signed)
I called and discussed with Paul Johnston about clinical trials and lung transplant.  He is not ready to get a referral for both at the current time but wants to continue to discuss about it at his return clinic visit.  Nothing further needed.

## 2022-10-01 ENCOUNTER — Other Ambulatory Visit: Payer: Self-pay | Admitting: Pulmonary Disease

## 2022-10-01 DIAGNOSIS — J849 Interstitial pulmonary disease, unspecified: Secondary | ICD-10-CM

## 2022-10-02 MED ORDER — ESBRIET 801 MG PO TABS
801.0000 mg | ORAL_TABLET | Freq: Three times a day (TID) | ORAL | 3 refills | Status: DC
Start: 2022-10-02 — End: 2022-10-07

## 2022-10-07 ENCOUNTER — Other Ambulatory Visit: Payer: Self-pay | Admitting: Pharmacist

## 2022-10-07 DIAGNOSIS — J849 Interstitial pulmonary disease, unspecified: Secondary | ICD-10-CM

## 2022-10-07 MED ORDER — ESBRIET 801 MG PO TABS
801.0000 mg | ORAL_TABLET | Freq: Three times a day (TID) | ORAL | 1 refills | Status: DC
Start: 2022-10-07 — End: 2023-01-16

## 2022-10-07 NOTE — Telephone Encounter (Signed)
Refill sent for ESBRIET to [facility] ([facility]) for Esbriet: [phone]  Dose: 801 mg three times daily  Last OV: 08/27/22 Provider: Dr. Isaiah Serge  Next OV: 6 months (Jan 2025) LFTs on 08/27/22 wnl  Chesley Mires, PharmD, MPH, BCPS Clinical Pharmacist (Rheumatology and Pulmonology)

## 2022-12-04 DIAGNOSIS — D23112 Other benign neoplasm of skin of right lower eyelid, including canthus: Secondary | ICD-10-CM | POA: Diagnosis not present

## 2022-12-04 DIAGNOSIS — H353131 Nonexudative age-related macular degeneration, bilateral, early dry stage: Secondary | ICD-10-CM | POA: Diagnosis not present

## 2022-12-04 DIAGNOSIS — H2513 Age-related nuclear cataract, bilateral: Secondary | ICD-10-CM | POA: Diagnosis not present

## 2022-12-04 DIAGNOSIS — H40013 Open angle with borderline findings, low risk, bilateral: Secondary | ICD-10-CM | POA: Diagnosis not present

## 2022-12-04 DIAGNOSIS — E119 Type 2 diabetes mellitus without complications: Secondary | ICD-10-CM | POA: Diagnosis not present

## 2022-12-04 DIAGNOSIS — H1045 Other chronic allergic conjunctivitis: Secondary | ICD-10-CM | POA: Diagnosis not present

## 2022-12-04 DIAGNOSIS — H04123 Dry eye syndrome of bilateral lacrimal glands: Secondary | ICD-10-CM | POA: Diagnosis not present

## 2022-12-04 LAB — HM DIABETES EYE EXAM

## 2022-12-05 ENCOUNTER — Other Ambulatory Visit: Payer: 59

## 2022-12-05 DIAGNOSIS — E785 Hyperlipidemia, unspecified: Secondary | ICD-10-CM | POA: Diagnosis not present

## 2022-12-05 DIAGNOSIS — E1169 Type 2 diabetes mellitus with other specified complication: Secondary | ICD-10-CM

## 2022-12-05 DIAGNOSIS — Z0000 Encounter for general adult medical examination without abnormal findings: Secondary | ICD-10-CM

## 2022-12-05 DIAGNOSIS — N400 Enlarged prostate without lower urinary tract symptoms: Secondary | ICD-10-CM

## 2022-12-06 LAB — LIPID PANEL
Cholesterol: 125 mg/dL (ref <200)
HDL: 40 mg/dL (ref >=40)
LDL Cholesterol (Calc): 65 mg/dL
Non-HDL Cholesterol (Calc): 85 mg/dL (ref <130)
Total CHOL/HDL Ratio: 3.1 (calc) (ref <5.0)
Triglycerides: 116 mg/dL (ref <150)

## 2022-12-06 LAB — COMPLETE METABOLIC PANEL WITH GFR
AG Ratio: 1.3 (calc) (ref 1.0–2.5)
ALT: 17 U/L (ref 9–46)
AST: 21 U/L (ref 10–35)
Albumin: 4.1 g/dL (ref 3.6–5.1)
Alkaline phosphatase (APISO): 41 U/L (ref 35–144)
BUN/Creatinine Ratio: 13 (calc) (ref 6–22)
BUN: 17 mg/dL (ref 7–25)
CO2: 29 mmol/L (ref 20–32)
Calcium: 9.7 mg/dL (ref 8.6–10.3)
Chloride: 103 mmol/L (ref 98–110)
Creat: 1.29 mg/dL — ABNORMAL HIGH (ref 0.70–1.28)
Globulin: 3.1 g/dL (ref 1.9–3.7)
Glucose, Bld: 119 mg/dL — ABNORMAL HIGH (ref 65–99)
Potassium: 5.4 mmol/L — ABNORMAL HIGH (ref 3.5–5.3)
Sodium: 142 mmol/L (ref 135–146)
Total Bilirubin: 0.4 mg/dL (ref 0.2–1.2)
Total Protein: 7.2 g/dL (ref 6.1–8.1)
eGFR: 59 mL/min/{1.73_m2} — ABNORMAL LOW (ref >=60)

## 2022-12-06 LAB — CBC WITH DIFFERENTIAL/PLATELET
Absolute Lymphocytes: 2279 {cells}/uL (ref 850–3900)
Absolute Monocytes: 678 {cells}/uL (ref 200–950)
Basophils Absolute: 74 {cells}/uL (ref 0–200)
Basophils Relative: 0.7 %
Eosinophils Absolute: 307 {cells}/uL (ref 15–500)
Eosinophils Relative: 2.9 %
HCT: 48.2 % (ref 38.5–50.0)
Hemoglobin: 15.4 g/dL (ref 13.2–17.1)
MCH: 27.9 pg (ref 27.0–33.0)
MCHC: 32 g/dL (ref 32.0–36.0)
MCV: 87.5 fL (ref 80.0–100.0)
MPV: 10.8 fL (ref 7.5–12.5)
Monocytes Relative: 6.4 %
Neutro Abs: 7261 {cells}/uL (ref 1500–7800)
Neutrophils Relative %: 68.5 %
Platelets: 204 10*3/uL (ref 140–400)
RBC: 5.51 10*6/uL (ref 4.20–5.80)
RDW: 13.3 % (ref 11.0–15.0)
Total Lymphocyte: 21.5 %
WBC: 10.6 10*3/uL (ref 3.8–10.8)

## 2022-12-06 LAB — PSA: PSA: 0.67 ng/mL (ref <=4.00)

## 2022-12-06 LAB — HEMOGLOBIN A1C
Hgb A1c MFr Bld: 6.8 %{Hb} — ABNORMAL HIGH (ref <5.7)
Mean Plasma Glucose: 148 mg/dL
eAG (mmol/L): 8.2 mmol/L

## 2022-12-06 LAB — TSH: TSH: 3.93 m[IU]/L (ref 0.40–4.50)

## 2022-12-13 ENCOUNTER — Encounter: Payer: Self-pay | Admitting: Family Medicine

## 2022-12-13 ENCOUNTER — Ambulatory Visit (INDEPENDENT_AMBULATORY_CARE_PROVIDER_SITE_OTHER): Payer: 59 | Admitting: Family Medicine

## 2022-12-13 ENCOUNTER — Other Ambulatory Visit: Payer: Self-pay | Admitting: Family Medicine

## 2022-12-13 VITALS — BP 128/60 | HR 84 | Ht 69.0 in | Wt 232.0 lb

## 2022-12-13 DIAGNOSIS — N400 Enlarged prostate without lower urinary tract symptoms: Secondary | ICD-10-CM | POA: Diagnosis not present

## 2022-12-13 DIAGNOSIS — E66811 Obesity, class 1: Secondary | ICD-10-CM

## 2022-12-13 DIAGNOSIS — J849 Interstitial pulmonary disease, unspecified: Secondary | ICD-10-CM

## 2022-12-13 DIAGNOSIS — J8410 Pulmonary fibrosis, unspecified: Secondary | ICD-10-CM | POA: Diagnosis not present

## 2022-12-13 DIAGNOSIS — R7989 Other specified abnormal findings of blood chemistry: Secondary | ICD-10-CM

## 2022-12-13 DIAGNOSIS — E1169 Type 2 diabetes mellitus with other specified complication: Secondary | ICD-10-CM

## 2022-12-13 DIAGNOSIS — B351 Tinea unguium: Secondary | ICD-10-CM | POA: Diagnosis not present

## 2022-12-13 DIAGNOSIS — Z0000 Encounter for general adult medical examination without abnormal findings: Secondary | ICD-10-CM

## 2022-12-13 DIAGNOSIS — K219 Gastro-esophageal reflux disease without esophagitis: Secondary | ICD-10-CM | POA: Diagnosis not present

## 2022-12-13 DIAGNOSIS — E785 Hyperlipidemia, unspecified: Secondary | ICD-10-CM | POA: Diagnosis not present

## 2022-12-13 MED ORDER — TERBINAFINE HCL 250 MG PO TABS: 250.0000 mg | ORAL_TABLET | Freq: Every day | ORAL | 2 refills | Status: DC

## 2022-12-13 MED ORDER — OMEPRAZOLE 40 MG PO CPDR: 40.0000 mg | DELAYED_RELEASE_CAPSULE | Freq: Every day | ORAL | 3 refills | Status: DC

## 2022-12-13 MED ORDER — LOSARTAN POTASSIUM 25 MG PO TABS: 25.0000 mg | ORAL_TABLET | Freq: Every day | ORAL | 3 refills | Status: DC

## 2022-12-13 MED ORDER — ROSUVASTATIN CALCIUM 20 MG PO TABS: 20.0000 mg | ORAL_TABLET | Freq: Every day | ORAL | 3 refills | Status: DC

## 2022-12-13 MED ORDER — METFORMIN HCL ER 500 MG PO TB24: 500.0000 mg | ORAL_TABLET | Freq: Every day | ORAL | 3 refills | Status: DC

## 2022-12-13 NOTE — Assessment & Plan Note (Signed)
A1c 6.8 stable, seems last result 5.5 was anomaly but usually in 6 range Complications - hyperlipidemia, obesity, GERD - increases risk of future cardiovascular complications  On ARB  Plan:  1. Continue Metformin IR 500 daily to XR daily 2. Encourage improved lifestyle - low carb, low sugar diet, reduce portion size, continue improving regular exercise 3. Check CBG , bring log to next visit for review 4. Continue Statin, ARB low dose DM Foot Update DM Eye

## 2022-12-13 NOTE — Patient Instructions (Addendum)
Thank you for coming to the office today.  For Fungal pill daily for 3 months to see if this can resolve the thickened toenails.  Mild elevated Creatinine 1.29, most likely related to the Advil, and goal is to limit this and drink more water.  Also will check the A1c.  Refilled medications.  DUE for FASTING BLOOD WORK (no food or drink after midnight before the lab appointment, only water or coffee without cream/sugar on the morning of)  SCHEDULE "Lab Only" visit in the morning at the clinic for lab draw in 6 MONTHS   - Make sure Lab Only appointment is at about 1 week before your next appointment, so that results will be available  For Lab Results, once available within 2-3 days of blood draw, you can can log in to MyChart online to view your results and a brief explanation. Also, we can discuss results at next follow-up visit.   Please schedule a Follow-up Appointment to: Return in about 6 months (around 06/12/2023) for 6 month NON fasting lab (2-3pm) , then 1 week later Follow-up DM, Kidney, updates (after 2pm).  If you have any other questions or concerns, please feel free to call the office or send a message through MyChart. You may also schedule an earlier appointment if necessary.  Additionally, you may be receiving a survey about your experience at our office within a few days to 1 week by e-mail or mail. We value your feedback.  Saralyn Pilar, DO [facility], [place]

## 2022-12-13 NOTE — Progress Notes (Signed)
Subjective:    Patient ID: Paul Johnston, male    DOB: 19-Jun-1950, 72 y.o.   MRN: [id number]  Paul Johnston is a 72 y.o. male presenting on 12/13/2022 for Annual Exam   HPI  Here for Annual Physical and Lab Review  Discussed the use of AI scribe software for clinical note transcription with the patient, who gave verbal consent to proceed.  Interstitial Lung Disease / Pulmonary FIbrosis Followed by Dr Isaiah Serge Last CT 07/2022 Still admits increased dyspnea with progression of ILD. He is on Esbriet, considering future treatment options   Vitamin D Deficiency Continue on D3 supplement   CHRONIC DM, Type 2 Obestiy BMI >34 A1c at 6.8, overall stable in 6 range. CBGs: Infrequent checks, none recently. Meds: Metformin XR 500mg  once daily Reports good compliance. Tolerating well w/o side-effects Currently not on ACEi/ARB Lifestyle: Weight down 3 lbs few months - Diet (improving low carb DM diet)  - Exercise: limited Update DM Eye Exam [place] Denies hypoglycemia, polyuria, visual changes, numbness or tingling.   HYPERLIPIDEMIA: - Reports no concerns. Last lipid panel 11/2022, controlled  - Currently taking Rosuvastatin 20mg , tolerating well without side effects or myalgias   Elevated BP BP Controlled Current Meds - Losartan 25mg  daily Denies CP, dyspnea, HA, edema, dizziness / lightheadedness  Elevated Creatinine / Reduced GFR Last lab shows Cr 1.29, and reduced GFR 59 He took significant amount of Advil after COVID Flu Shots, and believes this may have impacted the results.   Onychomycosis History of toenail thickening, which he believes to be fungal in nature. He has been managing this condition with over-the-counter treatments, but expressed interest in trying an oral antifungal medication.  -------------------------   Health Maintenance:   PSA 0.67 (11/2022)   COVID Flu TDap vaccine done at [facility] 11/26/22     12/13/2022    3:09 PM 03/08/2022    2:39 PM 11/30/2021     3:45 PM  Depression screen PHQ 2/9  Decreased Interest 0  1  Down, Depressed, Hopeless 1 1 0  PHQ - 2 Score 1 1 1   Altered sleeping 1 1 1   Tired, decreased energy 1 1 1   Change in appetite 0  0  Feeling bad or failure about yourself  0  0  Trouble concentrating 0  0  Moving slowly or fidgety/restless 0  0  Suicidal thoughts 0  0  PHQ-9 Score 3 3 3   Difficult doing work/chores Not difficult at all Not difficult at all Not difficult at all    Past Medical History:  Diagnosis Date   Allergy    Arthritis    Colon cancer screening 03/08/2022   GERD (gastroesophageal reflux disease)    Hyperlipidemia    Non-melanoma skin cancer    Ruptured intervertebral disc    Sleep apnea    Was diagnosed with moderate to light sleep apnea   Ulcer    Past Surgical History:  Procedure Laterality Date   APPENDECTOMY     COLONOSCOPY     FRACTURE SURGERY  1980's   Lower 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   HERNIA REPAIR  2014?   Umbilical hernia. Mesh installed.   INSERTION OF MESH N/A 07/16/2012   Procedure: INSERTION OF MESH;  Surgeon: Shelly Rubenstein, MD;  Location: [facility];  Service: General;  Laterality: N/A;   ORBITAL FRACTURE SURGERY     UMBILICAL HERNIA REPAIR N/A 07/16/2012   Procedure: HERNIA REPAIR UMBILICAL WITH MESH;  Surgeon: Shelly Rubenstein, MD;  Location: [facility];  Service: General;  Laterality: N/A;   UPPER GI ENDOSCOPY     Social History   Socioeconomic History   Marital status: Divorced    Spouse name: Not on file   Number of children: 2   Years of education: Not on file   Highest education level: Associate degree: occupational, Scientist, product/process development, or vocational program  Occupational History    Employer: [employer]  Tobacco Use   Smoking status: Former    Current packs/day: 0.00    Average packs/day: 1 pack/day for 33.0 years (33.0 ttl pk-yrs)    Types: Cigarettes    Start date: 09/07/1975    Quit date: 09/06/2008    Years since  quitting: 14.2    Passive exposure: Past   Smokeless tobacco: Former  Building services engineer status: Never Used  Substance and Sexual Activity   Alcohol use: No   Drug use: No   Sexual activity: Not on file  Other Topics Concern   Not on file  Social History Narrative   Two natural children and three step.  Lives with wife.    Social Determinants of Health   Financial Resource Strain: Low Risk  (06/03/2022)   Overall Financial Resource Strain (CARDIA)    Difficulty of Paying Living Expenses: Not very hard  Food Insecurity: No Food Insecurity (06/03/2022)   Hunger Vital Sign    Worried About Running Out of Food in the Last Year: Never true    Ran Out of Food in the Last Year: Never true  Transportation Needs: No Transportation Needs (06/03/2022)   PRAPARE - Administrator, Civil Service (Medical): No    Lack of Transportation (Non-Medical): No  Physical Activity: Inactive (06/03/2022)   Exercise Vital Sign    Days of Exercise per Week: 0 days    Minutes of Exercise per Session: 0 min  Stress: No Stress Concern Present (06/03/2022)   [employer] of Occupational Health - Occupational Stress Questionnaire    Feeling of Stress : Only a little  Social Connections: Moderately Isolated (06/03/2022)   Social Connection and Isolation Panel [NHANES]    Frequency of Communication with Friends and Family: More than three times a week    Frequency of Social Gatherings with Friends and Family: Patient declined    Attends Religious Services: 1 to 4 times per year    Active Member of [employer] or Organizations: No    Attends Banker Meetings: Never    Marital Status: Divorced  Catering manager Violence: Not At Risk (03/08/2022)   Humiliation, Afraid, Rape, and Kick questionnaire    Fear of Current or Ex-Partner: No    Emotionally Abused: No    Physically Abused: No    Sexually Abused: No   Family History  Problem Relation Age of Onset   Emphysema Father        died  age 49   Heart failure Father    COPD Father    Cancer Mother    Vision loss Mother    Heart disease Paternal Grandfather    COPD Paternal Grandfather    Current Outpatient Medications on File Prior to Visit  Medication Sig   Cholecalciferol (VITAMIN D3) 50 MCG (2000 UT) TABS Take 2,000 Units by mouth daily.   ESBRIET 801 MG TABS Take 1 tablet (801 mg total) by mouth 3 (three) times daily with meals.   Lancets (ONETOUCH ULTRASOFT) lancets Use as instructed   ONETOUCH VERIO test strip  Check blood sugar up to 2 times daily as instructed   No current facility-administered medications on file prior to visit.    Review of Systems  Constitutional:  Negative for activity change, appetite change, chills, diaphoresis, fatigue and fever.  HENT:  Negative for congestion and hearing loss.   Eyes:  Negative for visual disturbance.  Respiratory:  Negative for cough, chest tightness, shortness of breath and wheezing.   Cardiovascular:  Negative for chest pain, palpitations and leg swelling.  Gastrointestinal:  Negative for abdominal pain, constipation, diarrhea, nausea and vomiting.  Genitourinary:  Negative for dysuria, frequency and hematuria.  Musculoskeletal:  Negative for arthralgias and neck pain.  Skin:  Negative for rash.  Neurological:  Negative for dizziness, weakness, light-headedness, numbness and headaches.  Hematological:  Negative for adenopathy.  Psychiatric/Behavioral:  Negative for behavioral problems, dysphoric mood and sleep disturbance.    Per HPI unless specifically indicated above      Objective:    BP 128/60   Pulse 84   Ht 5\' 9"  (1.753 m)   Wt 232 lb (105.2 kg)   SpO2 90%   BMI 34.26 kg/m   Wt Readings from Last 3 Encounters:  12/13/22 232 lb (105.2 kg)  08/27/22 235 lb 3.2 oz (106.7 kg)  06/05/22 240 lb (108.9 kg)    Physical Exam Vitals and nursing note reviewed.  Constitutional:      General: He is not in acute distress.    Appearance: He is  well-developed. He is not diaphoretic.     Comments: Well-appearing, comfortable, cooperative  HENT:     Head: Normocephalic and atraumatic.  Eyes:     General:        Right eye: No discharge.        Left eye: No discharge.     Conjunctiva/sclera: Conjunctivae normal.     Pupils: Pupils are equal, round, and reactive to light.  Neck:     Thyroid: No thyromegaly.     Vascular: No carotid bruit.  Cardiovascular:     Rate and Rhythm: Normal rate and regular rhythm.     Pulses: Normal pulses.     Heart sounds: Normal heart sounds. No murmur heard. Pulmonary:     Effort: Pulmonary effort is normal. No respiratory distress.     Breath sounds: Normal breath sounds. No wheezing or rales.  Abdominal:     General: Bowel sounds are normal. There is no distension.     Palpations: Abdomen is soft. There is no mass.     Tenderness: There is no abdominal tenderness.  Musculoskeletal:        General: No tenderness. Normal range of motion.     Cervical back: Normal range of motion and neck supple.     Right lower leg: No edema.     Left lower leg: No edema.     Comments: Upper / Lower Extremities: - Normal muscle tone, strength bilateral upper extremities 5/5, lower extremities 5/5  Lymphadenopathy:     Cervical: No cervical adenopathy.  Skin:    General: Skin is warm and dry.     Findings: No erythema or rash.  Neurological:     Mental Status: He is alert and oriented to person, place, and time.     Comments: Distal sensation intact to light touch all extremities  Psychiatric:        Mood and Affect: Mood normal.        Behavior: Behavior normal.        Thought  Content: Thought content normal.     Comments: Well groomed, good eye contact, normal speech and thoughts     Diabetic Foot Exam - Simple   Simple Foot Form Diabetic Foot exam was performed with the following findings: Yes 12/13/2022  2:52 PM  Visual Inspection See comments: Yes Sensation Testing Intact to touch and  monofilament testing bilaterally: Yes Pulse Check Posterior Tibialis and Dorsalis pulse intact bilaterally: Yes Comments Bilateral thickened toenails. Intact monofilament sensation. No ulceration or callus.    I have personally reviewed the radiology report from 08/03/22 on CT Chest.  CLINICAL DATA:  Interstitial lung disease   EXAM: CT CHEST WITHOUT CONTRAST   TECHNIQUE: Multidetector CT imaging of the chest was performed following the standard protocol without intravenous contrast. High resolution imaging of the lungs, as well as inspiratory and expiratory imaging, was performed.   RADIATION DOSE REDUCTION: This exam was performed according to the departmental dose-optimization program which includes automated exposure control, adjustment of the mA and/or kV according to patient size and/or use of iterative reconstruction technique.   COMPARISON:  12/07/2021, 07/16/2021, 05/15/2021, 04/02/2005   FINDINGS: Cardiovascular: Aortic atherosclerosis. Normal heart size. Three-vessel coronary artery calcifications. No pericardial effusion.   Mediastinum/Nodes: Unchanged prominent mediastinal lymph nodes. Thyroid gland, trachea, and esophagus demonstrate no significant findings.   Lungs/Pleura: Mild to moderate pulmonary fibrosis in a pattern with apical to basal gradient, featuring irregular peripheral interstitial opacity, septal thickening, traction bronchiectasis, subpleural bronchiolectasis, and areas of honeycombing at the lung bases. Fibrotic findings are slightly worsened compared to prior examination dated 07/13/2021 essentially new compared to remote prior examination dated 2007. No significant air trapping on expiratory phase imaging. Underlying centrilobular and paraseptal emphysema. No pleural effusion or pneumothorax.   Upper Abdomen: No acute abnormality. Mildly coarse contour of the liver.   Musculoskeletal: No chest wall abnormality. No acute  osseous findings.   IMPRESSION: 1. Mild to moderate pulmonary fibrosis in a pattern with apical to basal gradient, featuring irregular peripheral interstitial opacity, septal thickening, traction bronchiectasis, subpleural bronchiolectasis, and areas of honeycombing at the lung bases. Fibrotic findings are slightly worsened compared to prior examination dated 07/13/2021, essentially new compared to remote prior examination dated 2007. Interval progression and more clear evidence of honeycombing on today's examination is consistent with UIP pattern fibrosis. Findings are consistent with UIP per consensus guidelines: Diagnosis of Idiopathic Pulmonary Fibrosis: An Official ATS/ERS/JRS/ALAT Clinical Practice Guideline. Am Rosezetta Schlatter Crit Care Med Vol 198, Iss 5, [phone], Oct 12 2016. 2. Emphysema. 3. Coronary artery disease. 4. Mildly coarse contour of the liver, suggestive of cirrhosis. Correlate with biochemical findings.   Aortic Atherosclerosis (ICD10-I70.0) and Emphysema (ICD10-J43.9).     Electronically Signed   By: Jearld Lesch M.D.   On: 08/03/2022 13:01   Results for orders placed or performed in visit on 12/05/22  TSH  Result Value Ref Range   TSH 3.93 0.40 - 4.50 mIU/L  PSA  Result Value Ref Range   PSA 0.67 < OR = 4.00 ng/mL  Lipid panel  Result Value Ref Range   Cholesterol 125 <200 mg/dL   HDL 40 > OR = 40 mg/dL   Triglycerides 295 <621 mg/dL   LDL Cholesterol (Calc) 65 mg/dL (calc)   Total CHOL/HDL Ratio 3.1 <5.0 (calc)   Non-HDL Cholesterol (Calc) 85 <308 mg/dL (calc)  Hemoglobin M5H  Result Value Ref Range   Hgb A1c MFr Bld 6.8 (H) <5.7 % of total Hgb   Mean Plasma Glucose 148 mg/dL  eAG (mmol/L) 8.2 mmol/L  CBC with Differential/Platelet  Result Value Ref Range   WBC 10.6 3.8 - 10.8 Thousand/uL   RBC 5.51 4.20 - 5.80 Million/uL   Hemoglobin 15.4 13.2 - 17.1 g/dL   HCT 16.1 09.6 - 04.5 %   MCV 87.5 80.0 - 100.0 fL   MCH 27.9 27.0 - 33.0 pg   MCHC  32.0 32.0 - 36.0 g/dL   RDW 40.9 81.1 - 91.4 %   Platelets 204 140 - 400 Thousand/uL   MPV 10.8 7.5 - 12.5 fL   Neutro Abs 7,261 1,500 - 7,800 cells/uL   Absolute Lymphocytes 2,279 850 - 3,900 cells/uL   Absolute Monocytes 678 200 - 950 cells/uL   Eosinophils Absolute 307 15 - 500 cells/uL   Basophils Absolute 74 0 - 200 cells/uL   Neutrophils Relative % 68.5 %   Total Lymphocyte 21.5 %   Monocytes Relative 6.4 %   Eosinophils Relative 2.9 %   Basophils Relative 0.7 %  COMPLETE METABOLIC PANEL WITH GFR  Result Value Ref Range   Glucose, Bld 119 (H) 65 - 99 mg/dL   BUN 17 7 - 25 mg/dL   Creat 7.82 (H) 9.56 - 1.28 mg/dL   eGFR 59 (L) > OR = 60 mL/min/1.85m2   BUN/Creatinine Ratio 13 6 - 22 (calc)   Sodium 142 135 - 146 mmol/L   Potassium 5.4 (H) 3.5 - 5.3 mmol/L   Chloride 103 98 - 110 mmol/L   CO2 29 20 - 32 mmol/L   Calcium 9.7 8.6 - 10.3 mg/dL   Total Protein 7.2 6.1 - 8.1 g/dL   Albumin 4.1 3.6 - 5.1 g/dL   Globulin 3.1 1.9 - 3.7 g/dL (calc)   AG Ratio 1.3 1.0 - 2.5 (calc)   Total Bilirubin 0.4 0.2 - 1.2 mg/dL   Alkaline phosphatase (APISO) 41 35 - 144 U/L   AST 21 10 - 35 U/L   ALT 17 9 - 46 U/L      Assessment & Plan:   Problem List Items Addressed This Visit     BPH (benign prostatic hyperplasia)   Gastroesophageal reflux disease   Relevant Medications   omeprazole (PRILOSEC) 40 MG capsule   Hyperlipidemia associated with type 2 diabetes mellitus (HCC)    Controlled  Plan: 1. Continue current meds - Rosuvastatin 20mg  daily 2. Not on ASA 3. Encourage improved lifestyle - low carb/cholesterol, reduce portion size, continue improving regular exercise      Relevant Medications   metFORMIN (GLUCOPHAGE-XR) 500 MG 24 hr tablet   losartan (COZAAR) 25 MG tablet   rosuvastatin (CRESTOR) 20 MG tablet   Interstitial lung disease (HCC)   Obesity (BMI 30.0-34.9)   Pulmonary fibrosis (HCC)   Type 2 diabetes mellitus with other specified complication (HCC)    A1c 6.8  stable, seems last result 5.5 was anomaly but usually in 6 range Complications - hyperlipidemia, obesity, GERD - increases risk of future cardiovascular complications  On ARB  Plan:  1. Continue Metformin IR 500 daily to XR daily 2. Encourage improved lifestyle - low carb, low sugar diet, reduce portion size, continue improving regular exercise 3. Check CBG , bring log to next visit for review 4. Continue Statin, ARB low dose DM Foot Update DM Eye      Relevant Medications   metFORMIN (GLUCOPHAGE-XR) 500 MG 24 hr tablet   losartan (COZAAR) 25 MG tablet   rosuvastatin (CRESTOR) 20 MG tablet   Other Visit Diagnoses  Annual physical exam    -  Primary   Elevated serum creatinine       Onychomycosis       Relevant Medications   terbinafine (LAMISIL) 250 MG tablet       Updated Health Maintenance information Reviewed recent lab results with patient Encouraged improvement to lifestyle with diet and exercise Goal of weight loss    Elevated Creatinine Mildly elevated creatinine at 1.29 (up from 1.0) and reduced GFR. Likely related to recent Advil use. -Limit Advil use and increase hydration. -Repeat creatinine and GFR in 6 months.  Onychomycosis Thickened and brittle toenails, likely fungal. -Start Oral Terbinafine 250mg  1 pill daily for 3 months. -Check liver function if treatment extends beyond 3 months.  ILD / Pulmonary Fibrosis Followed by Pulmonology Patient reports increasing shortness of breath. Currently on Esbriet.  General Health Maintenance -Received COVID, flu, and Tdap vaccines on 11/26/2022. -Schedule non-fasting labs in 6 months, followed by office visit 1 week later.         Meds ordered this encounter  Medications   metFORMIN (GLUCOPHAGE-XR) 500 MG 24 hr tablet    Sig: Take 1 tablet (500 mg total) by mouth daily with supper.    Dispense:  90 tablet    Refill:  3   losartan (COZAAR) 25 MG tablet    Sig: Take 1 tablet (25 mg total) by mouth  daily.    Dispense:  90 tablet    Refill:  3   omeprazole (PRILOSEC) 40 MG capsule    Sig: Take 1 capsule (40 mg total) by mouth daily before breakfast.    Dispense:  90 capsule    Refill:  3   rosuvastatin (CRESTOR) 20 MG tablet    Sig: Take 1 tablet (20 mg total) by mouth daily.    Dispense:  90 tablet    Refill:  3   terbinafine (LAMISIL) 250 MG tablet    Sig: Take 1 tablet (250 mg total) by mouth daily.    Dispense:  30 tablet    Refill:  2     Follow up plan: Return in about 6 months (around 06/12/2023) for 6 month NON fasting lab (2-3pm) , then 1 week later Follow-up DM, Kidney, updates (after 2pm).  Future Labs A1c CMET 06/13/23  Saralyn Pilar, DO [facility] 12/13/2022, 2:28 PM

## 2022-12-13 NOTE — Assessment & Plan Note (Signed)
Controlled  Plan: 1. Continue current meds - Rosuvastatin 20mg  daily 2. Not on ASA 3. Encourage improved lifestyle - low carb/cholesterol, reduce portion size, continue improving regular exercise

## 2023-01-16 ENCOUNTER — Other Ambulatory Visit: Payer: Self-pay | Admitting: Pharmacist

## 2023-01-16 DIAGNOSIS — J849 Interstitial pulmonary disease, unspecified: Secondary | ICD-10-CM

## 2023-01-16 MED ORDER — ESBRIET 801 MG PO TABS: 801.0000 mg | ORAL_TABLET | Freq: Three times a day (TID) | ORAL | 1 refills | Status: DC

## 2023-01-16 NOTE — Telephone Encounter (Signed)
Refill for Esbriet sent to [facility]  Dose: 801mg  three times daily  LFTs on 12/05/22 wnl  Next appt with Dr. Isaiah Serge on 02/26/2023  Chesley Mires, PharmD, MPH, BCPS, CPP Clinical Pharmacist (Rheumatology and Pulmonology)

## 2023-02-26 ENCOUNTER — Ambulatory Visit: Payer: Medicare HMO | Admitting: Pulmonary Disease

## 2023-02-26 ENCOUNTER — Encounter: Payer: Self-pay | Admitting: Pulmonary Disease

## 2023-02-26 VITALS — BP 132/70 | HR 72 | Ht 69.0 in | Wt 227.0 lb

## 2023-02-26 DIAGNOSIS — Z5181 Encounter for therapeutic drug level monitoring: Secondary | ICD-10-CM | POA: Diagnosis not present

## 2023-02-26 DIAGNOSIS — G4733 Obstructive sleep apnea (adult) (pediatric): Secondary | ICD-10-CM

## 2023-02-26 DIAGNOSIS — J849 Interstitial pulmonary disease, unspecified: Secondary | ICD-10-CM | POA: Diagnosis not present

## 2023-02-26 NOTE — Progress Notes (Signed)
Paul Johnston    [id number]    1951/01/14  Primary Care Physician:[name], [name], DO  Referring Physician: [name], DO [address] [place],  [place] [address]  Chief complaint: Follow-up for IPF Started Esbriet in August 2023  HPI: 73 y.o.  with history of hypertension, diabetes, hyperlipidemia, sleep apnea. Previously seen by Dr. Maple Hudson in 2018 for OSA,  AHA 14.7. CPAP suggested but he never got started on it  He recently had a low-dose screening CT that showed prominent pulmonary fibrosis and has been referred here for further evaluation States that he has mild dyspnea on exertion, no cough, congestion  Pets: dogs Occupation: used to work as an Lexicographer.  Exposures:Previously exposed to cotton dust and metal grinding ILD questionnaire 07/03/21 - as above Smoking history: 37 pack year smoker. Quit in 2010 Travel history: no significant travel history Relevant family history: father and grandfather had emphysema. There were smokers.  Interim history: Discussed the use of AI scribe software for clinical note transcription with the patient, who gave verbal consent to proceed.  The patient, with a history of idiopathic pulmonary fibrosis (IPF), presents for a follow-up visit. He reports compliance with his current medication regimen, including Esbriet, despite intermittent episodes of diarrhea. He expresses a preference for tolerating these side effects over the progression of his lung disease. He also mentions being on Lamisil, which he suspects may be contributing to his gastrointestinal symptoms. He is scheduled for an echocardiogram in the near future, which was initially ordered last year but was not completed. He also mentions having had a pulmonary function test (PFT) last July. [facility] and expresses a desire for coordination of care between his various providers.   Outpatient Encounter  Medications as of 02/26/2023  Medication Sig   Cholecalciferol (VITAMIN D3) 50 MCG (2000 UT) TABS Take 2,000 Units by mouth daily.   ESBRIET 801 MG TABS Take 1 tablet (801 mg total) by mouth 3 (three) times daily with meals.   Lancets (ONETOUCH ULTRASOFT) lancets Use as instructed   losartan (COZAAR) 25 MG tablet Take 1 tablet (25 mg total) by mouth daily.   metFORMIN (GLUCOPHAGE-XR) 500 MG 24 hr tablet Take 1 tablet (500 mg total) by mouth daily with supper.   omeprazole (PRILOSEC) 40 MG capsule Take 1 capsule (40 mg total) by mouth daily before breakfast.   ONETOUCH VERIO test strip Check blood sugar up to 2 times daily as instructed   rosuvastatin (CRESTOR) 20 MG tablet Take 1 tablet (20 mg total) by mouth daily.   terbinafine (LAMISIL) 250 MG tablet Take 1 tablet (250 mg total) by mouth daily.   No facility-administered encounter medications on file as of 02/26/2023.   Physical Exam: Blood pressure 132/70, pulse 72, height 5\' 9"  (1.753 m), weight 227 lb (103 kg), SpO2 95%. Gen:      No acute distress HEENT:  EOMI, sclera anicteric Neck:     No masses; no thyromegaly Lungs:   Bibasal crackles CV:         Regular rate and rhythm; no murmurs Abd:      + bowel sounds; soft, non-tender; no palpable masses, no distension Ext:    No edema; adequate peripheral perfusion Skin:      Warm and dry; no rash Neuro: alert and oriented x 3 Psych: normal mood and affect   Data Reviewed: Imaging: Screening CT chest 05/14/21 -  Paraseptal emphysema. Lower lung predominant reticular capacities, pulmonary nodules.   High-resolution CT 07/13/2021-emphysema, subpleural reticulation, groundglass and traction bronchiectasis with basilar predominance.  Probable UIP pattern.  8 mm left lower lobe lung  Screening CT chest 12/07/2021-mild emphysema, stable pulmonary nodule, stable interstitial lung disease  High resolution CT 07/29/2022-mild to moderate pulmonary fibrosis in UIP pattern with progression compared  to before.  PFTs: 09/11/2021 FVC 2.03 [70%], FEV1 2.41 [76%], F/F 79, TLC 5.68 [83%], DLCO 13.43 [53%] Moderate diffusion defect  08/13/2022 FVC 2.87 [61%], FEV1 2.33 [94%], F/F81, TLC 5.49 [80%], DLCO 10.35 [41%] Moderate diffusion defect  Labs: CTD serologies 07/05/2021-positive only for double-stranded DNA  Hepatic panel 03/25/2022-within normal limits  Assessment:  Idiopathic Pulmonary Fibrosis (IPF) CT scan that shows changes that could be probable UIP pattern of pulmonary fibrosis. He does not have any signs and symptoms of connective tissue disease and minimal exposures. Serologies for connective tissue disease shows nonspecific minimal elevation in double-stranded DNA This is likely to be IPF  Stable on Esbriet with intermittent diarrhea as a side effect. Last PFT was in July of the previous year. Echocardiogram scheduled for February 12th. -Continue Esbriet. -Complete scheduled echocardiogram to evaluate pulmonary hypertension  Liver Function On Esbriet which requires monitoring of liver function. Last labs were in October of the previous year. -Continue current monitoring plan.  Obstructive sleep apnea Discussed his diagnosis of sleep apnea. He is currently not on treatment with CPAP He would like to hold off reordering the home sleep study for now until we have a better idea of his interstitial lung disease  Coordination of Care Patient plans [facility] and expressed concern about coordination between providers. -Ensure communication with [facility] providers via fax as needed.  Follow-up in 6 months.   Plan/Recommendations: Labs for monitoring Continue Esbriet Echocardiogram  Chilton Greathouse MD [facility] 02/26/2023, 3:55 PM  CC: Saralyn Pilar *

## 2023-02-26 NOTE — Patient Instructions (Signed)
 VISIT SUMMARY:  You had a follow-up visit today to discuss your idiopathic pulmonary fibrosis (IPF) and overall health. You are currently taking Esbriet  and Lamisil , and you mentioned experiencing some gastrointestinal side effects. We also discussed your upcoming echocardiogram and your recent pulmonary function test. Additionally, we talked about coordinating your care with the [place].  YOUR PLAN:  -IDIOPATHIC PULMONARY FIBROSIS (IPF): Idiopathic Pulmonary Fibrosis (IPF) is a lung disease that causes scarring of the lung tissue, making it difficult to breathe. You are stable on Esbriet , although you experience intermittent diarrhea. Please continue taking Esbriet  as prescribed. You have an echocardiogram scheduled for February 12th, which is important for monitoring your heart health.  -LIVER FUNCTION: Your liver function needs to be monitored regularly because you are taking Esbriet . Your last liver function tests were done in October of the previous year. We will continue with the current monitoring plan to ensure your liver remains healthy.  -COORDINATION OF CARE: You are in the process of [facility] and have concerns about coordinating your care between different providers. We will ensure communication with your [facility] providers via fax as needed to keep everyone informed and coordinated.  INSTRUCTIONS:  Please follow up in 6 months for your next appointment. Make sure to complete your scheduled echocardiogram on February 12th. Continue taking your medications as prescribed and report any new or worsening symptoms.

## 2023-03-07 ENCOUNTER — Encounter: Payer: Self-pay | Admitting: Pulmonary Disease

## 2023-03-07 DIAGNOSIS — Z79899 Other long term (current) drug therapy: Secondary | ICD-10-CM

## 2023-03-07 DIAGNOSIS — E1169 Type 2 diabetes mellitus with other specified complication: Secondary | ICD-10-CM

## 2023-03-07 DIAGNOSIS — J849 Interstitial pulmonary disease, unspecified: Secondary | ICD-10-CM

## 2023-03-12 NOTE — Telephone Encounter (Signed)
Dr. Isaiah Serge, The patient is asking if he can have his liver function drawn in May when he has his other labs drawn for Dr. Althea Charon.  Please advise.  Thank you.

## 2023-03-14 ENCOUNTER — Encounter: Payer: Self-pay | Admitting: Family Medicine

## 2023-03-14 ENCOUNTER — Ambulatory Visit (INDEPENDENT_AMBULATORY_CARE_PROVIDER_SITE_OTHER): Payer: 59

## 2023-03-14 VITALS — BP 138/70 | Ht 69.0 in | Wt 229.0 lb

## 2023-03-14 DIAGNOSIS — Z0000 Encounter for general adult medical examination without abnormal findings: Secondary | ICD-10-CM

## 2023-03-14 NOTE — Patient Instructions (Addendum)
Mr. Amparan , Thank you for taking time to come for your Medicare Wellness Visit. I appreciate your ongoing commitment to your health goals. Please review the following plan we discussed and let me know if I can assist you in the future.   Referrals/Orders/Follow-Ups/Clinician Recommendations: NONE  This is a list of the screening recommended for you and due dates:  Health Maintenance  Topic Date Due   Zoster (Shingles) Vaccine (1 of 2) Never done   COVID-19 Vaccine (8 - 2024-25 season) 01/21/2023   Yearly kidney health urinalysis for diabetes  06/05/2023   Hemoglobin A1C  06/05/2023   Screening for Lung Cancer  07/29/2023   Eye exam for diabetics  12/04/2023   Yearly kidney function blood test for diabetes  12/05/2023   Complete foot exam   12/13/2023   Medicare Annual Wellness Visit  03/13/2024   Colon Cancer Screening  03/15/2025   DTaP/Tdap/Td vaccine (5 - Td or Tdap) 11/25/2032   Pneumonia Vaccine  Completed   Flu Shot  Completed   Hepatitis C Screening  Completed   HPV Vaccine  Aged Out    Advanced directives: (ACP Link)Information on Advanced Care Planning can be found at [facility] [place] Advance Health Care Directives Advance Health Care Directives ([email or web address]   Next Medicare Annual Wellness Visit scheduled for next year: Yes   03/19/24 @ 2:40 PM IN PERSON

## 2023-03-14 NOTE — Progress Notes (Signed)
Subjective:   Paul Johnston is a 73 y.o. male who presents for Medicare Annual/Subsequent preventive examination.  Visit Complete: In person  Cardiac Risk Factors include: advanced age (>70men, >38 women);dyslipidemia;diabetes mellitus;male gender;sedentary lifestyle;obesity (BMI >30kg/m2)     Objective:    Today's Vitals   03/14/23 1515  BP: 138/70  Weight: 229 lb (103.9 kg)  Height: 5\' 9"  (1.753 m)   Body mass index is 33.82 kg/m.     03/14/2023    3:28 PM 03/08/2022    2:42 PM 03/02/2021    2:56 PM 10/27/2018    2:35 PM 07/13/2012   12:58 PM  Advanced Directives  Does Patient Have a Medical Advance Directive? No No No No Patient does not have advance directive  Would patient like information on creating a medical advance directive? No - Patient declined No - Patient declined No - Patient declined No - Patient declined     Current Medications (verified) Outpatient Encounter Medications as of 03/14/2023  Medication Sig   Cholecalciferol (VITAMIN D3) 50 MCG (2000 UT) TABS Take 2,000 Units by mouth daily.   ESBRIET 801 MG TABS Take 1 tablet (801 mg total) by mouth 3 (three) times daily with meals.   Lancets (ONETOUCH ULTRASOFT) lancets Use as instructed   losartan (COZAAR) 25 MG tablet Take 1 tablet (25 mg total) by mouth daily.   metFORMIN (GLUCOPHAGE-XR) 500 MG 24 hr tablet Take 1 tablet (500 mg total) by mouth daily with supper.   omeprazole (PRILOSEC) 40 MG capsule Take 1 capsule (40 mg total) by mouth daily before breakfast.   ONETOUCH VERIO test strip Check blood sugar up to 2 times daily as instructed   rosuvastatin (CRESTOR) 20 MG tablet Take 1 tablet (20 mg total) by mouth daily.   [DISCONTINUED] terbinafine (LAMISIL) 250 MG tablet Take 1 tablet (250 mg total) by mouth daily. (Patient not taking: Reported on 03/14/2023)   No facility-administered encounter medications on file as of 03/14/2023.    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   HERNIA REPAIR  2014?   Umbilical hernia. Mesh installed.   INSERTION OF MESH N/A 07/16/2012   Procedure: INSERTION OF MESH;  Surgeon: Shelly Rubenstein, MD;  Location: [facility];  Service: General;  Laterality: N/A;   ORBITAL FRACTURE SURGERY     UMBILICAL HERNIA REPAIR N/A 07/16/2012   Procedure: HERNIA REPAIR UMBILICAL WITH MESH;  Surgeon: Shelly Rubenstein, MD;  Location: [facility];  Service: General;  Laterality: N/A;   UPPER GI ENDOSCOPY     Family History  Problem Relation Age of Onset   Emphysema Father        died age 39   Heart failure Father    COPD Father    Cancer Mother    Vision loss Mother    Heart disease Paternal Grandfather    COPD Paternal Grandfather    Social History  Socioeconomic History   Marital status: Divorced    Spouse name: Not on file   Number of children: 2   Years of education: Not on file   Highest education level: Associate degree: occupational, Scientist, product/process development, or vocational program  Occupational History    Employer: [employer]  Tobacco Use   Smoking status: Former    Current packs/day: 0.00    Average packs/day: 1 pack/day for 33.0 years (33.0 ttl pk-yrs)    Types: Cigarettes    Start date: 09/07/1975    Quit date: 09/06/2008    Years since quitting: 14.5    Passive exposure: Past   Smokeless tobacco: Former  Building services engineer status: Never Used  Substance and Sexual Activity   Alcohol use: No   Drug use: No   Sexual activity: Not  on file  Other Topics Concern   Not on file  Social History Narrative   Two natural children and three step.  Lives with wife.    Social Drivers of Corporate investment banker Strain: Low Risk  (03/14/2023)   Overall Financial Resource Strain (CARDIA)    Difficulty of Paying Living Expenses: Not hard at all  Food Insecurity: No Food Insecurity (03/14/2023)   Hunger Vital Sign    Worried About Running Out of Food in the Last Year: Never true    Ran Out of Food in the Last Year: Never true  Transportation Needs: No Transportation Needs (03/14/2023)   PRAPARE - Administrator, Civil Service (Medical): No    Lack of Transportation (Non-Medical): No  Physical Activity: Insufficiently Active (03/14/2023)   Exercise Vital Sign    Days of Exercise per Week: 2 days    Minutes of Exercise per Session: 20 min  Stress: No Stress Concern Present (03/14/2023)   [employer] of Occupational Health - Occupational Stress Questionnaire    Feeling of Stress : Not at all  Social Connections: Socially Isolated (03/14/2023)   Social Connection and Isolation Panel [NHANES]    Frequency of Communication with Friends and Family: More than three times a week    Frequency of Social Gatherings with Friends and Family: More than three times a week    Attends Religious Services: Never    Database administrator or Organizations: No    Attends Engineer, structural: Never    Marital Status: Divorced    Tobacco Counseling Counseling given: Not Answered   Clinical Intake:  Pre-visit preparation completed: Yes  Pain : No/denies pain     BMI - recorded: 33.82 Nutritional Status: BMI > 30  Obese Nutritional Risks: None Diabetes: Yes CBG done?: No Did pt. bring in CBG monitor from home?: No  How often do you need to have someone help you when you read instructions, pamphlets, or other written materials from your doctor or pharmacy?: 1 - Never  Interpreter Needed?:  No  Information entered by :: Kennedy Bucker, LPN   Activities of Daily Living    03/14/2023    3:30 PM 03/10/2023   12:57 PM  In your present state of health, do you have any difficulty performing the following activities:  Hearing? 0 0  Vision? 0 0  Difficulty concentrating or making decisions? 0 0  Walking or climbing stairs? 1 0  Comment SHOB W/ PULM FIBROSIS   Dressing or bathing? 0 0  Doing errands, shopping? 0 0  Preparing Food and eating ? N N  Using the Toilet? N N  In  the past six months, have you accidently leaked urine? N N  Do you have problems with loss of bowel control? N N  Managing your Medications? N N  Managing your Finances? N N  Housekeeping or managing your Housekeeping? N N    Patient Care Team: [name], DO as PCP - General (Family Medicine) Isla Pence, OD (Optometry)  Indicate any recent Medical Services you may have received from other than [facility] providers in the past year (date may be approximate).     Assessment:   This is a routine wellness examination for [place].  Hearing/Vision screen Hearing Screening - Comments:: NO AIDS Vision Screening - Comments:: [facility]- [facility]   Goals Addressed             This Visit's Progress    Cut out extra servings         Depression Screen    03/14/2023    3:23 PM 12/13/2022    3:09 PM 03/08/2022    2:39 PM 11/30/2021    3:45 PM 03/02/2021    2:54 PM 11/01/2020    2:15 PM 11/01/2019    2:13 PM  PHQ 2/9 Scores  PHQ - 2 Score 1 1 1 1 1 1  0  PHQ- 9 Score 1 3 3 3  3      Fall Risk    03/14/2023    3:30 PM 03/10/2023   12:57 PM 02/26/2023    3:25 PM 12/13/2022    3:10 PM 06/05/2022    3:44 PM  Fall Risk   Falls in the past year? 0 0 0 0 0  Number falls in past yr: 0  0 0   Injury with Fall? 0  0 0   Risk for fall due to : No Fall Risks      Follow up Falls prevention discussed;Falls evaluation completed        MEDICARE RISK AT HOME: Medicare Risk at Home Any stairs in or  around the home?: Yes If so, are there any without handrails?: Yes Home free of loose throw rugs in walkways, pet beds, electrical cords, etc?: Yes Adequate lighting in your home to reduce risk of falls?: Yes Life alert?: No Use of a cane, walker or w/c?: No Grab bars in the bathroom?: No Shower chair or bench in shower?: No Elevated toilet seat or a handicapped toilet?: No  TIMED UP AND GO:  Was the test performed?  Yes  Length of time to ambulate 10 feet: 4 sec Gait steady and fast without use of assistive device    Cognitive Function:        03/14/2023    3:32 PM 03/08/2022    2:58 PM 10/27/2018    2:36 PM  6CIT Screen  What Year? 0 points 0 points 0 points  What month? 0 points 0 points 0 points  What time? 0 points 0 points 0 points  Count back from 20 0 points 0 points 0 points  Months in reverse 0 points 0 points 0 points  Repeat phrase 0 points 2 points 0 points  Total Score 0 points 2 points 0 points    Immunizations Immunization History  Administered Date(s) Administered   Fluad Quad(high Dose 65+) 12/08/2018, 12/09/2019, 11/28/2020, 11/30/2021   Influenza, High Dose Seasonal PF 12/12/2016, 12/17/2017, 11/26/2022   Influenza,inj,Quad PF,6+ Mos 11/18/2012, 12/21/2015   PFIZER Comirnaty(Gray Top)Covid-19 Tri-Sucrose Vaccine 06/29/2020   PFIZER(Purple Top)SARS-COV-2 Vaccination 03/20/2019, 04/10/2019, 11/18/2019   Pfizer Covid-19 Vaccine Bivalent Booster 26yrs &  up 11/17/2020   Pfizer Covid-19 Vaccine Bivalent Booster 5y-11y 11/26/2021   Pfizer(Comirnaty)Fall Seasonal Vaccine 12 years and older 11/26/2021, 11/26/2022   Pneumococcal Conjugate-13 12/02/2012   Pneumococcal Polysaccharide-23 06/11/2017   Respiratory Syncytial Virus Vaccine,Recomb Aduvanted(Arexvy) 11/26/2021   Rsv, Bivalent, Protein Subunit Rsvpref,pf Verdis Frederickson) 11/26/2021   Td 11/20/2004, 12/21/2010   Tdap 12/21/2010, 11/26/2022    TDAP status: Up to date  Flu Vaccine status: Up to  date  Pneumococcal vaccine status: Declined,  Education has been provided regarding the importance of this vaccine but patient still declined. Advised may receive this vaccine at local pharmacy or Health Dept. Aware to provide a copy of the vaccination record if obtained from local pharmacy or Health Dept. Verbalized acceptance and understanding.   Covid-19 vaccine status: Completed vaccines  Qualifies for Shingles Vaccine? Yes   Zostavax completed No   Shingrix Completed?: No.    Education has been provided regarding the importance of this vaccine. Patient has been advised to call insurance company to determine out of pocket expense if they have not yet received this vaccine. Advised may also receive vaccine at local pharmacy or Health Dept. Verbalized acceptance and understanding.  Screening Tests Health Maintenance  Topic Date Due   Zoster Vaccines- Shingrix (1 of 2) Never done   COVID-19 Vaccine (8 - 2024-25 season) 01/21/2023   Diabetic kidney evaluation - Urine ACR  06/05/2023   HEMOGLOBIN A1C  06/05/2023   Lung Cancer Screening  07/29/2023   OPHTHALMOLOGY EXAM  12/04/2023   Diabetic kidney evaluation - eGFR measurement  12/05/2023   FOOT EXAM  12/13/2023   Medicare Annual Wellness (AWV)  03/13/2024   Colonoscopy  03/15/2025   DTaP/Tdap/Td (5 - Td or Tdap) 11/25/2032   Pneumonia Vaccine 67+ Years old  Completed   INFLUENZA VACCINE  Completed   Hepatitis C Screening  Completed   HPV VACCINES  Aged Out    Health Maintenance  Health Maintenance Due  Topic Date Due   Zoster Vaccines- Shingrix (1 of 2) Never done   COVID-19 Vaccine (8 - 2024-25 season) 01/21/2023    Colorectal cancer screening: Type of screening: Colonoscopy. Completed 03/16/15. Repeat every 10 years  Lung Cancer Screening: (Low Dose CT Chest recommended if Age 39-80 years, 20 pack-year currently smoking OR have quit w/in 15years.) does qualify.   Lung Cancer Screening Referral: CT SCAN 07/29/22  Additional  Screening:  Hepatitis C Screening: does qualify; Completed 01/11/15  Vision Screening: Recommended annual ophthalmology exams for early detection of glaucoma and other disorders of the eye. Is the patient up to date with their annual eye exam?  Yes  Who is the provider or what is the name of the office in which the patient attends annual eye exams? [facility] If pt is not established with a provider, would they like to be referred to a provider to establish care? No .   Dental Screening: Recommended annual dental exams for proper oral hygiene  Diabetic Foot Exam: Diabetic Foot Exam: Completed 12/13/22  Community Resource Referral / Chronic Care Management: CRR required this visit?  No   CCM required this visit?  No     Plan:     I have personally reviewed and noted the following in the patient's chart:   Medical and social history Use of alcohol, tobacco or illicit drugs  Current medications and supplements including opioid prescriptions. Patient is not currently taking opioid prescriptions. Functional ability and status Nutritional status Physical activity Advanced directives List of other physicians Hospitalizations, surgeries,  and ER visits in previous 12 months Vitals Screenings to include cognitive, depression, and falls Referrals and appointments  In addition, I have reviewed and discussed with patient certain preventive protocols, quality metrics, and best practice recommendations. A written personalized care plan for preventive services as well as general preventive health recommendations were provided to patient.     Hal Hope, LPN   03/27/863   After Visit Summary: (In Person-Declined) Patient declined AVS at this time.  Nurse Notes: NONE

## 2023-03-19 ENCOUNTER — Other Ambulatory Visit: Payer: Self-pay | Admitting: Family Medicine

## 2023-03-19 DIAGNOSIS — J849 Interstitial pulmonary disease, unspecified: Secondary | ICD-10-CM

## 2023-03-19 DIAGNOSIS — R7989 Other specified abnormal findings of blood chemistry: Secondary | ICD-10-CM

## 2023-03-19 DIAGNOSIS — Z79899 Other long term (current) drug therapy: Secondary | ICD-10-CM

## 2023-03-19 DIAGNOSIS — E1169 Type 2 diabetes mellitus with other specified complication: Secondary | ICD-10-CM

## 2023-03-19 NOTE — Telephone Encounter (Signed)
 Hello, Dr. Dr. Romeo Co,  Please see below. Paul Johnston would like to get all his labs drawn at May during his visit with you. Can you please all hepatic panel to monitor since he is on Pirfenidone . Thanks  [name]

## 2023-03-26 ENCOUNTER — Ambulatory Visit
Admission: RE | Admit: 2023-03-26 | Discharge: 2023-03-26 | Disposition: A | Discharge status: Home / Self Care | Payer: Medicare HMO | Source: Ambulatory Visit | Attending: Pulmonary Disease

## 2023-03-26 DIAGNOSIS — Z87891 Personal history of nicotine dependence: Secondary | ICD-10-CM | POA: Diagnosis not present

## 2023-03-26 DIAGNOSIS — E119 Type 2 diabetes mellitus without complications: Secondary | ICD-10-CM | POA: Insufficient documentation

## 2023-03-26 DIAGNOSIS — R0600 Dyspnea, unspecified: Secondary | ICD-10-CM | POA: Insufficient documentation

## 2023-03-26 DIAGNOSIS — R0602 Shortness of breath: Secondary | ICD-10-CM | POA: Insufficient documentation

## 2023-03-26 DIAGNOSIS — E785 Hyperlipidemia, unspecified: Secondary | ICD-10-CM | POA: Insufficient documentation

## 2023-03-26 LAB — ECHOCARDIOGRAM COMPLETE
AR max vel: 2.06 cm2
AV Area VTI: 2.18 cm2
AV Area mean vel: 1.92 cm2
AV Mean grad: 4 mm[Hg]
AV Peak grad: 8.2 mm[Hg]
Ao pk vel: 1.43 m/s
Area-P 1/2: 3.72 cm2
MV VTI: 2.83 cm2
S' Lateral: 2.8 cm

## 2023-03-26 MED ORDER — PERFLUTREN LIPID MICROSPHERE
1.0000 mL | INTRAVENOUS | Status: AC | PRN
  Administered 2023-03-26: 4 mL via INTRAVENOUS

## 2023-03-26 NOTE — Progress Notes (Signed)
*  PRELIMINARY RESULTS* Echocardiogram 2D Echocardiogram has been performed.  Paul Johnston 03/26/2023, 12:29 PM

## 2023-04-09 ENCOUNTER — Encounter: Payer: Self-pay | Admitting: Pulmonary Disease

## 2023-04-09 ENCOUNTER — Encounter: Payer: Self-pay | Admitting: Radiology

## 2023-04-09 ENCOUNTER — Ambulatory Visit
Admission: RE | Admit: 2023-04-09 | Discharge: 2023-04-09 | Disposition: A | Discharge status: Home / Self Care | Payer: Medicare HMO | Source: Ambulatory Visit | Attending: Pulmonary Disease | Admitting: Pulmonary Disease

## 2023-04-09 DIAGNOSIS — I700 Atherosclerosis of aorta: Secondary | ICD-10-CM | POA: Diagnosis not present

## 2023-04-09 DIAGNOSIS — R0602 Shortness of breath: Secondary | ICD-10-CM

## 2023-04-09 MED ORDER — IOPAMIDOL (ISOVUE-370) INJECTION 76%
75.0000 mL | Freq: Once | INTRAVENOUS | Status: AC | PRN
  Administered 2023-04-09: 75 mL via INTRAVENOUS

## 2023-04-09 NOTE — Progress Notes (Signed)
 [place] telephone note  Telephone call with patient.  He has acute worsening of his dyspnea for the past 3 weeks and says a short of breath on minimal exertion.  Denies any cough or sputum production. Reviewed echocardiogram dated 03/26/2023 with LVEF 50-55%, grade 1 diastolic dysfunction Moderate enlargement in RV size, moderately elevated PA systolic pressure.  This was around the time when he had worsening of his breathing.  I am concerned about PE, IPF exacerbation versus any infection or inflammation.  I have advised him to seek emergency care immediately but he is in the middle of changing his insurance and does not want any extra cost though he knows that this can be a life-threatening emergency. Will check his oxygen levels at rest and on exertion and if they are low then I urged him to seek emergency care In the meantime I tried to order a CT angiogram and get it approved by insurance before scheduling ASAP  Chilton Greathouse MD [facility] See Amion for pager  If no response to pager , please call [phone] until 7pm After 7:00 pm call Elink  [phone] 04/09/2023, 9:48 AM

## 2023-04-10 MED ORDER — AMOXICILLIN-POT CLAVULANATE 875-125 MG PO TABS: 1.0000 | ORAL_TABLET | Freq: Two times a day (BID) | ORAL | 0 refills | Status: DC

## 2023-04-10 MED ORDER — PREDNISONE 10 MG PO TABS: 60.0000 mg | ORAL_TABLET | Freq: Every day | ORAL | 0 refills | Status: DC

## 2023-04-10 NOTE — Telephone Encounter (Signed)
 Discussed with patient.  Reviewed CT angiogram 04/09/2023 showing no pulm embolism but evidence of bronchopneumonia He already took doxycycline earlier this month.  Will call in a course of Augmentin and prednisone

## 2023-04-29 ENCOUNTER — Encounter: Payer: Self-pay | Admitting: Family Medicine

## 2023-04-29 ENCOUNTER — Other Ambulatory Visit: Payer: Self-pay

## 2023-04-29 ENCOUNTER — Telehealth: Payer: Self-pay

## 2023-04-29 ENCOUNTER — Ambulatory Visit (INDEPENDENT_AMBULATORY_CARE_PROVIDER_SITE_OTHER): Admitting: Family Medicine

## 2023-04-29 VITALS — BP 134/70 | HR 85 | Ht 69.0 in | Wt 221.0 lb

## 2023-04-29 DIAGNOSIS — J8410 Pulmonary fibrosis, unspecified: Secondary | ICD-10-CM

## 2023-04-29 DIAGNOSIS — J189 Pneumonia, unspecified organism: Secondary | ICD-10-CM

## 2023-04-29 MED ORDER — PREDNISONE 20 MG PO TABS: ORAL_TABLET | ORAL | 0 refills | Status: DC

## 2023-04-29 MED ORDER — LEVOFLOXACIN 500 MG PO TABS: 500.0000 mg | ORAL_TABLET | Freq: Every day | ORAL | 0 refills | Status: DC

## 2023-04-29 NOTE — Telephone Encounter (Signed)
 Copied from CRM [id number]. Topic: Clinical - Prescription Issue >> Apr 29, 2023  4:21 PM Victorino Dike T wrote: Reason for CRM: predniSONE (DELTASONE) 20 MG tablet- was sent to [facility] but should have gone to [facility]

## 2023-04-29 NOTE — Patient Instructions (Addendum)
 Thank you for coming to the office today.  Start taking Levaquin antibiotic 500mg  daily x 7 days Caution with muscle injury with higher impact activity  Prednisone taper 7 day if needed.  Keep on other treatments  X-ray here if not improving or if you want to double check.  We do need to prove the pneumonia is gone at some point on X-ray or CT from Dr Isaiah Serge  Please schedule a Follow-up Appointment to: Return if symptoms worsen or fail to improve.  If you have any other questions or concerns, please feel free to call the office or send a message through MyChart. You may also schedule an earlier appointment if necessary.  Additionally, you may be receiving a survey about your experience at our office within a few days to 1 week by e-mail or mail. We value your feedback.  Saralyn Pilar, DO [facility], [place]

## 2023-04-29 NOTE — Progress Notes (Unsigned)
 Subjective:    Patient ID: Paul Johnston, male    DOB: 1951/02/06, 73 y.o.   MRN: [id number]  Paul Johnston is a 73 y.o. male presenting on 04/29/2023 for Pneumonia   HPI  Discussed the use of AI scribe software for clinical note transcription with the patient, who gave verbal consent to proceed.  History of Present Illness   Paul Diekman "Gene" is a 73 year old male with pulmonary fibrosis who presents with worsening shortness of breath and history of pneumonia  Recently diagnosed with multifocal bronchial pneumonia, saw Pulm and had CT and cardiac work up. Diagnosed on CT 04/09/23. He was treated with a seven-day course of Augmentin (after finishing Doxycycline), two pills a day, and a tapering dose of prednisone starting at six pills and decreasing daily. Initially, there was some improvement, but symptoms have returned. A CT scan was performed due to his lung condition, which showed no significant change in his pulmonary fibrosis but indicated bronchial pneumonia. He describes his quality of life as 'just terrible' over the last month due to these respiratory issues.  He has experienced significant worsening of shortness of breath over the past month, severely impacting his daily activities. He is unable to perform simple tasks like making the bed or walking to the kitchen without needing to rest. Walking to the mailbox is also challenging, and his daughter sends the grandchildren to bring his mail.  He has a history of pulmonary fibrosis, which contributes to his respiratory difficulties. He has previously taken doxycycline, Augmentin, and a Z-Pak for other conditions.          04/29/2023    2:13 PM 03/14/2023    3:23 PM 12/13/2022    3:09 PM  Depression screen PHQ 2/9  Decreased Interest 1 0 0  Down, Depressed, Hopeless 0 1 1  PHQ - 2 Score 1 1 1   Altered sleeping 1 0 1  Tired, decreased energy 1 0 1  Change in appetite 0 0 0  Feeling bad or failure about yourself  0 0 0  Trouble  concentrating 0 0 0  Moving slowly or fidgety/restless 0 0 0  Suicidal thoughts 0 0 0  PHQ-9 Score 3 1 3   Difficult doing work/chores Not difficult at all Not difficult at all Not difficult at all       04/29/2023    2:13 PM 12/13/2022    3:10 PM 06/05/2022    3:44 PM 11/30/2021    3:45 PM  GAD 7 : Generalized Anxiety Score  Nervous, Anxious, on Edge 0 0 0 0  Control/stop worrying 0 0 0 0  Worry too much - different things 0 0 0 0  Trouble relaxing 0 0 0 0  Restless 0 0 0 0  Easily annoyed or irritable 0 0 0 0  Afraid - awful might happen 0 0 0 0  Total GAD 7 Score 0 0 0 0  Anxiety Difficulty    Not difficult at all    Social History   Tobacco Use   Smoking status: Former    Current packs/day: 0.00    Average packs/day: 1 pack/day for 33.0 years (33.0 ttl pk-yrs)    Types: Cigarettes    Start date: 09/07/1975    Quit date: 09/06/2008    Years since quitting: 14.6    Passive exposure: Past   Smokeless tobacco: Former  Building services engineer status: Never Used  Substance Use Topics   Alcohol use: No   Drug  use: No    Review of Systems Per HPI unless specifically indicated above     Objective:    BP 134/70 (BP Location: Left Arm, Patient Position: Sitting)   Pulse 85   Ht 5\' 9"  (1.753 m)   Wt 221 lb (100.2 kg)   SpO2 90%   BMI 32.64 kg/m   Wt Readings from Last 3 Encounters:  04/29/23 221 lb (100.2 kg)  03/14/23 229 lb (103.9 kg)  02/26/23 227 lb (103 kg)    Physical Exam Vitals and nursing note reviewed.  Constitutional:      General: He is not in acute distress.    Appearance: He is well-developed. He is not diaphoretic.     Comments: Well-appearing, comfortable, cooperative  HENT:     Head: Normocephalic and atraumatic.  Eyes:     General:        Right eye: No discharge.        Left eye: No discharge.     Conjunctiva/sclera: Conjunctivae normal.  Neck:     Thyroid: No thyromegaly.  Cardiovascular:     Rate and Rhythm: Normal rate and regular  rhythm.     Pulses: Normal pulses.     Heart sounds: Normal heart sounds. No murmur heard. Pulmonary:     Effort: Pulmonary effort is normal. No respiratory distress.     Breath sounds: No wheezing or rales.     Comments: Coarse breath sounds, reduced air movement. Musculoskeletal:        General: Normal range of motion.     Cervical back: Normal range of motion and neck supple.  Lymphadenopathy:     Cervical: No cervical adenopathy.  Skin:    General: Skin is warm and dry.     Findings: No erythema or rash.  Neurological:     Mental Status: He is alert and oriented to person, place, and time. Mental status is at baseline.  Psychiatric:        Behavior: Behavior normal.     Comments: Well groomed, good eye contact, normal speech and thoughts     I have personally reviewed the radiology report from 04/09/23 on CTA.  CLINICAL DATA:  Worsening dyspnea for 3 weeks, dyspnea on exertion, history of cardiac dysfunction   EXAM: CT ANGIOGRAPHY CHEST WITH CONTRAST   TECHNIQUE: Multidetector CT imaging of the chest was performed using the standard protocol during bolus administration of intravenous contrast. Multiplanar CT image reconstructions and MIPs were obtained to evaluate the vascular anatomy.   RADIATION DOSE REDUCTION: This exam was performed according to the departmental dose-optimization program which includes automated exposure control, adjustment of the mA and/or kV according to patient size and/or use of iterative reconstruction technique.   CONTRAST:  75mL ISOVUE-370 IOPAMIDOL (ISOVUE-370) INJECTION 76%   COMPARISON:  07/29/2022   FINDINGS: Cardiovascular: This is a technically adequate evaluation of the pulmonary vasculature. There are no filling defects or pulmonary emboli.   The heart is unremarkable without pericardial effusion. Normal caliber of the thoracic aorta. Atherosclerosis of the aorta and coronary vasculature.   Mediastinum/Nodes: Stable  mediastinal and hilar adenopathy, likely reactive. Thyroid, trachea, and esophagus are unremarkable.   Lungs/Pleura: Persistent basilar predominant interlobular septal thickening, subpleural scarring and fibrosis again noted. There is persistent bronchiectasis, with bronchial wall thickening greatest at the lung bases new since prior study. There is patchy bibasilar airspace disease within the right middle, right lower, and left lower lobes. Overall, findings are consistent with acute bibasilar bronchopneumonia superimposed upon chronic pulmonary  fibrosis. No effusion or pneumothorax. Central airways are patent.   Upper Abdomen: No acute abnormality.   Musculoskeletal: No acute or destructive bony abnormalities. Reconstructed images demonstrate no additional findings.   Review of the MIP images confirms the above findings.   IMPRESSION: 1. Multifocal bibasilar bronchopneumonia, with new bronchial wall thickening and patchy bibasilar airspace disease since prior exam. 2. 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 3. No evidence of pulmonary embolus. 4. Stable mediastinal and hilar adenopathy, likely reactive. 5. Aortic Atherosclerosis (ICD10-I70.0). Coronary artery atherosclerosis.     Electronically Signed   By: Sharlet Salina M.D.   On: 04/09/2023 16:05  Results for orders placed or performed during the hospital encounter of 03/26/23  ECHOCARDIOGRAM COMPLETE   Collection Time: 03/26/23 12:29 PM  Result Value Ref Range   Ao pk vel 1.43 m/s   AV Area VTI 2.18 cm2   AR max vel 2.06 cm2   AV Mean grad 4.0 mmHg   AV Peak grad 8.2 mmHg   S' Lateral 2.80 cm   AV Area mean vel 1.92 cm2   Area-P 1/2 3.72 cm2   MV VTI 2.83 cm2   Est EF 50 - 55%       Assessment & Plan:   Problem List Items Addressed This Visit     Pulmonary fibrosis (HCC)   Other Visit Diagnoses       Multifocal pneumonia    -  Primary   Relevant Medications    levofloxacin (LEVAQUIN) 500 MG tablet   Other Relevant Orders   DG Chest 2 View        Bronchial Pneumonia Followed by Pulmonology Recurrent bronchial pneumonia with multifocal involvement causing significant dyspnea. Previous treatment with augmentin and prednisone was inadequate. (Note did have Doxycycline prior to that) recent CT confirmed pneumonia with no change in underlying pulmonary fibrosis. Switched to Levaquin due to inadequate response to previous antibiotics. Discussed rare risk of muscle injury with Levaquin and potential cost savings using GoodRx. - Prescribe Levaquin 500mg  mg once daily for 7 days. - Prescribe prednisone taper 7 days -  3 pills for 2 days, 2 pills for 2 days, 1 pill for 3 days - Order chest x-ray to monitor resolution of pneumonia if symptoms do not improve or for confirmation of resolution. Can do walk in for CXR Otherwise advised to f/u with Pulm for CT imaging in near future instead  Pulmonary Fibrosis Chronic pulmonary fibrosis with no significant change on recent CT scan. Ongoing management by pulmonologist, Dr. Isaiah Serge. - Continue management with pulmonologist, Dr. Isaiah Serge. - Plan for future CT scan to reassess pulmonary fibrosis after resolution of pneumonia.         Orders Placed This Encounter  Procedures   DG Chest 2 View    Standing Status:   Future    Expiration Date:   04/28/2024    Reason for Exam (SYMPTOM  OR DIAGNOSIS REQUIRED):   follow-up multifocal pneumonia    Preferred imaging location?:   [facility]    Meds ordered this encounter  Medications   levofloxacin (LEVAQUIN) 500 MG tablet    Sig: Take 1 tablet (500 mg total) by mouth daily. For 7 days    Dispense:  7 tablet    Refill:  0   DISCONTD: predniSONE (DELTASONE) 20 MG tablet    Sig: Take daily with food. Start with 60mg  (3 pills) x 2 days, then reduce to 40mg  (2 pills) x 2 days, then 20mg  (1  pill) x 3 days    Dispense:  13 tablet    Refill:  0    Follow up  plan: Return if symptoms worsen or fail to improve.    Saralyn Pilar, DO [facility] 04/29/2023, 2:34 PM

## 2023-05-07 ENCOUNTER — Other Ambulatory Visit: Payer: Self-pay | Admitting: Pulmonary Disease

## 2023-05-07 NOTE — Telephone Encounter (Signed)
 Please advise if appropriate to continue

## 2023-05-14 ENCOUNTER — Ambulatory Visit
Admission: RE | Admit: 2023-05-14 | Discharge: 2023-05-14 | Disposition: A | Discharge status: Home / Self Care | Source: Ambulatory Visit | Attending: Family Medicine | Admitting: Family Medicine

## 2023-05-14 ENCOUNTER — Encounter: Payer: Self-pay | Admitting: Family Medicine

## 2023-05-14 ENCOUNTER — Ambulatory Visit
Admission: RE | Admit: 2023-05-14 | Discharge: 2023-05-14 | Disposition: A | Discharge status: Home / Self Care | Attending: Family Medicine | Admitting: Family Medicine

## 2023-05-14 ENCOUNTER — Other Ambulatory Visit: Payer: Self-pay

## 2023-05-14 DIAGNOSIS — R918 Other nonspecific abnormal finding of lung field: Secondary | ICD-10-CM | POA: Diagnosis not present

## 2023-05-14 DIAGNOSIS — E1169 Type 2 diabetes mellitus with other specified complication: Secondary | ICD-10-CM

## 2023-05-14 DIAGNOSIS — J189 Pneumonia, unspecified organism: Secondary | ICD-10-CM | POA: Diagnosis not present

## 2023-05-14 DIAGNOSIS — K219 Gastro-esophageal reflux disease without esophagitis: Secondary | ICD-10-CM

## 2023-05-14 MED ORDER — OMEPRAZOLE 40 MG PO CPDR: 40.0000 mg | DELAYED_RELEASE_CAPSULE | Freq: Every day | ORAL | 3 refills | Status: DC

## 2023-05-14 MED ORDER — METFORMIN HCL ER 500 MG PO TB24: 500.0000 mg | ORAL_TABLET | Freq: Every day | ORAL | 3 refills | Status: DC

## 2023-05-14 MED ORDER — ROSUVASTATIN CALCIUM 20 MG PO TABS: 20.0000 mg | ORAL_TABLET | Freq: Every day | ORAL | 3 refills | Status: AC

## 2023-05-14 MED ORDER — LOSARTAN POTASSIUM 25 MG PO TABS
25.0000 mg | ORAL_TABLET | Freq: Every day | ORAL | 3 refills | Status: DC
Start: 2023-05-14 — End: 2023-08-19

## 2023-05-19 NOTE — Telephone Encounter (Signed)
 Dr Jayme Cloud, I am routing this mychart / patient to you for your review when you have a moment. There is a decent amount to read, so I just wanted to highlight the key question and reason I am routing to you. 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 He is not able to travel to [place] regularly for his care at this point and he prefers to transfer his care locally to [facility]. I was not sure best way to handle this type of transition, so I figured that I would ask you first - as patient looked into the [facility] site and would like the option to establish with you if possible. I'm not familiar with the provider subspecialty within your office. Is this transfer of care reasonable and would you be able to accept him as a patient or do you have a preferred other provider that primarily manages the ILD Pulm Fibrosis patients instead? I can place a new referral to your office if that is preferred, or would your office or Dr Shirlee More office be able to initiate this transfer? Sorry for all of the questions - just want to make sure I am following through with what this patient is looking for and doing everything properly from your standpoint.  Thank you!  Saralyn Pilar, DO [facility] 05/19/2023, 6:47 PM

## 2023-05-25 ENCOUNTER — Encounter: Payer: Self-pay | Admitting: Pulmonary Disease

## 2023-06-02 NOTE — Telephone Encounter (Signed)
[name], please schedule. Thanks 

## 2023-06-02 NOTE — Telephone Encounter (Signed)
 Okay to transfer care to Dr. Viva Grise in [place] as patient is finding it difficult to travel to [place]. If Dr. Viva Grise is not available then he can see any other provider there as well.

## 2023-06-02 NOTE — Telephone Encounter (Signed)
 Thank you :)

## 2023-06-04 ENCOUNTER — Ambulatory Visit: Admitting: Pulmonary Disease

## 2023-06-04 ENCOUNTER — Telehealth: Payer: Self-pay | Admitting: Pulmonary Disease

## 2023-06-04 ENCOUNTER — Encounter: Payer: Self-pay | Admitting: Pulmonary Disease

## 2023-06-04 VITALS — BP 110/80 | HR 78 | Temp 97.5°F | Ht 69.0 in | Wt 218.6 lb

## 2023-06-04 DIAGNOSIS — J8410 Pulmonary fibrosis, unspecified: Secondary | ICD-10-CM | POA: Diagnosis not present

## 2023-06-04 DIAGNOSIS — R062 Wheezing: Secondary | ICD-10-CM

## 2023-06-04 DIAGNOSIS — I272 Pulmonary hypertension, unspecified: Secondary | ICD-10-CM

## 2023-06-04 DIAGNOSIS — J9611 Chronic respiratory failure with hypoxia: Secondary | ICD-10-CM | POA: Diagnosis not present

## 2023-06-04 DIAGNOSIS — J439 Emphysema, unspecified: Secondary | ICD-10-CM | POA: Diagnosis not present

## 2023-06-04 DIAGNOSIS — Z87891 Personal history of nicotine dependence: Secondary | ICD-10-CM

## 2023-06-04 DIAGNOSIS — J449 Chronic obstructive pulmonary disease, unspecified: Secondary | ICD-10-CM

## 2023-06-04 DIAGNOSIS — R0602 Shortness of breath: Secondary | ICD-10-CM

## 2023-06-04 MED ORDER — IPRATROPIUM-ALBUTEROL 0.5-2.5 (3) MG/3ML IN SOLN
3.0000 mL | Freq: Once | RESPIRATORY_TRACT | Status: AC
  Administered 2023-06-04: 3 mL via RESPIRATORY_TRACT

## 2023-06-04 MED ORDER — ALBUTEROL SULFATE HFA 108 (90 BASE) MCG/ACT IN AERS: 2.0000 | INHALATION_SPRAY | Freq: Four times a day (QID) | RESPIRATORY_TRACT | 2 refills | Status: AC | PRN

## 2023-06-04 MED ORDER — TRELEGY ELLIPTA 100-62.5-25 MCG/ACT IN AEPB: 1.0000 | INHALATION_SPRAY | Freq: Every day | RESPIRATORY_TRACT | 0 refills | Status: DC

## 2023-06-04 NOTE — Telephone Encounter (Signed)
 I received a message from [place] with [employer] [facility] [place]   We only carry POC that go up to 4L. Can you please advise on the portability part?

## 2023-06-04 NOTE — Progress Notes (Signed)
 Subjective:    Patient ID: Paul Johnston, male    DOB: Jun 23, 1950, 73 y.o.   MRN: [id number]  Patient Care Team: Raina Bunting, DO as PCP - General (Family Medicine) Julia Oats, OD [facility])  Chief Complaint  Patient presents with   New Patient (Initial Visit)    DOE. Cough with light brown. Some wheezing. Had pneumonia in Feb.    BACKGROUND: Patient is a 73 year old former smoker with a history of pulmonary fibrosis previously followed by Dr. Praveen Mannam at our [facility] location.  The patient is transferring care here due to proximity of the clinic.  This is my first encounter with the patient.   HPI Discussed the use of AI scribe software for clinical note transcription with the patient, who gave verbal consent to proceed.  History of Present Illness   Paul Edelson "Gene" is a 73 year old male with pulmonary fibrosis and sleep apnea who presents with persistent respiratory symptoms following pneumonia.  He has a history of pulmonary fibrosis and has not been on oxygen therapy prior to this visit. He describes a significant decline in his ability to perform daily activities, such as making the bed, due to shortness of breath. He experienced a sudden worsening of symptoms, described as 'wham, bam', following a pneumonia diagnosis in February 2025. He completed two courses of prednisone  and antibiotics, which temporarily improved his symptoms, but he reports a progressive return of symptoms after finishing the medications. He continues to experience a productive cough with light brown sputum.  He has a history of sleep apnea diagnosed in 2018 but never obtained the recommended equipment due to insurance issues at the time. He acknowledges that this condition may contribute to his respiratory difficulties.  His past medical history includes a significant smoking history, having smoked a pack a day from age 62 until quitting in 2010. He also has a history of pneumonia,  having been hospitalized for it during his [employer] service in the [employer]. 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 He has a service-connected disability for tinnitus but has not pursued any VA benefits for his pulmonary condition.  He has not used inhalers for his breathing issues, although he recalls using one in the past for bronchitis. He wants to improve his physical activity, noting that before his pneumonia, he was able to walk more comfortably.   He is compliant with Esbriet  but does not feel that is helping in any way.    DATA Imaging: Screening CT chest 05/14/21 - Paraseptal emphysema. Lower lung predominant reticular capacities, pulmonary nodules.    High-resolution CT 07/13/2021-emphysema, subpleural reticulation, groundglass and traction bronchiectasis with basilar predominance.  Probable UIP pattern.  8 mm left lower lobe lung   Screening CT chest 12/07/2021-mild emphysema, stable pulmonary nodule, stable interstitial lung disease   High resolution CT 07/29/2022-mild to moderate pulmonary fibrosis in UIP pattern with progression compared to before.   CT angio chest 04/09/2023: Multifocal bibasilar bronchopneumonia with new bronchial wall thickening and patchy bibasilar airspace disease since prior exam.  Continued basilar predominant chronic scarring and fibrosis compatible with UIP/IPF.  No evidence of pulmonary embolus.  Stable mediastinal and hilar adenopathy likely reactive.  Aortic and coronary atherosclerosis.  Chest x-ray PA and lateral 0/03/2023: No airspace opacity or effusion, diffusion interstitial thickening similar to prior CT most compatible with chronic lung disease/fibrosis.  No active disease.  PFTs: 09/11/2021 FVC 2.03 [70%], FEV1 2.41 [76%], F/F 79, TLC 5.68 [83%], DLCO 13.43 [53%] Moderate diffusion defect   08/13/2022 FVC 2.87 [61%], FEV1 2.33 [94%], F/F81, TLC  5.49 [80%], DLCO 10.35 [41%] Moderate diffusion defect   Labs: CTD serologies 07/05/2021-positive only for double-stranded DNA   Hepatic panel 03/25/2022-within normal limits  Review of Systems A 10 point review of systems was performed and it is as noted above otherwise negative.   Past Medical History:  Diagnosis Date   Allergy    Arthritis    Colon cancer screening 03/08/2022   Diabetes mellitus without complication (HCC)    GERD (gastroesophageal reflux disease)    Hyperlipidemia    Non-melanoma skin cancer    Ruptured intervertebral disc    Sleep apnea    Was diagnosed with moderate to light sleep apnea   Ulcer     Past Surgical History:  Procedure Laterality Date   APPENDECTOMY     COLONOSCOPY     FRACTURE SURGERY  1980's   Lower 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   HERNIA REPAIR  2014?   Umbilical hernia. Mesh installed.   INSERTION OF MESH N/A 07/16/2012   Procedure: INSERTION OF MESH;  Surgeon: Rogena Class, MD;  Location: [facility];  Service: General;  Laterality: N/A;   ORBITAL FRACTURE SURGERY     UMBILICAL HERNIA REPAIR N/A 07/16/2012   Procedure: HERNIA REPAIR UMBILICAL WITH MESH;  Surgeon: Rogena Class, MD;  Location: [facility];  Service: General;  Laterality: N/A;   UPPER GI ENDOSCOPY      Patient Active Problem List   Diagnosis Date Noted   Interstitial lung disease (HCC) 08/13/2022   Colon cancer screening 03/08/2022   Diarrhea 03/08/2022   Flatulence, eructation and gas pain 03/08/2022   Gastroesophageal reflux disease 03/08/2022   Pulmonary fibrosis (HCC) 11/30/2021   Tinnitus, bilateral 01/27/2019   Non-melanoma skin cancer 09/15/2018   History of sleep apnea 09/15/2016   Type 2 diabetes mellitus with other specified complication (HCC)    Obesity (BMI 30.0-34.9) 12/09/2013   GERD (gastroesophageal reflux disease) 09/01/2012   Vitamin D  deficiency 09/01/2012   Solitary pulmonary nodule, history of, left  side 09/01/2012   BPH (benign prostatic hyperplasia) 09/01/2012   Umbilical hernia 07/03/2012   Hyperlipidemia associated with type 2 diabetes mellitus (HCC) 12/14/2008   Tobacco use disorder in remission 12/14/2008    Family History  Problem Relation Age of Onset   Emphysema Father        died age 11   Heart failure Father    COPD Father    Cancer Mother    Vision loss Mother    Heart disease Paternal Grandfather    COPD Paternal Grandfather     Social History   Tobacco Use   Smoking status: Former    Current packs/day: 0.00    Average packs/day: 1 pack/day for 33.0 years (33.0 ttl pk-yrs)    Types: Cigarettes    Start date: 09/07/1975    Quit date: 09/06/2008    Years since quitting: 14.7    Passive exposure: Past   Smokeless tobacco: Former  Substance Use Topics   Alcohol use: No    Allergies  Allergen Reactions   Codeine    Biaxin [Clarithromycin] Other (See Comments)    GI upset    Current Meds  Medication Sig   albuterol  (VENTOLIN  HFA) 108 (90 Base) MCG/ACT inhaler Inhale 2 puffs into the lungs every 6 (six)  hours as needed for wheezing or shortness of breath.   Cholecalciferol (VITAMIN D3) 50 MCG (2000 UT) TABS Take 2,000 Units by mouth daily.   ESBRIET  801 MG TABS Take 1 tablet (801 mg total) by mouth 3 (three) times daily with meals.   Fluticasone -Umeclidin-Vilant (TRELEGY ELLIPTA ) 100-62.5-25 MCG/ACT AEPB Inhale 1 puff into the lungs daily.   Lancets (ONETOUCH ULTRASOFT) lancets Use as instructed   losartan  (COZAAR ) 25 MG tablet Take 1 tablet (25 mg total) by mouth daily.   metFORMIN  (GLUCOPHAGE -XR) 500 MG 24 hr tablet Take 1 tablet (500 mg total) by mouth daily with supper.   omeprazole  (PRILOSEC) 40 MG capsule Take 1 capsule (40 mg total) by mouth daily before breakfast.   ONETOUCH VERIO test strip Check blood sugar up to 2 times daily as instructed   rosuvastatin  (CRESTOR ) 20 MG tablet Take 1 tablet (20 mg total) by mouth daily.    Immunization  History  Administered Date(s) Administered   Fluad Quad(high Dose 65+) 12/08/2018, 12/09/2019, 11/28/2020, 11/30/2021   Influenza, High Dose Seasonal PF 12/12/2016, 12/17/2017, 11/26/2022   Influenza,inj,Quad PF,6+ Mos 11/18/2012, 12/21/2015   PFIZER Comirnaty(Gray Top)Covid-19 Tri-Sucrose Vaccine 06/29/2020   PFIZER(Purple Top)SARS-COV-2 Vaccination 03/20/2019, 04/10/2019, 11/18/2019   Pfizer Covid-19 Vaccine Bivalent Booster 28yrs & up 11/17/2020   Pfizer Covid-19 Vaccine Bivalent Booster 5y-11y 11/26/2021   Pfizer(Comirnaty)Fall Seasonal Vaccine 12 years and older 11/26/2021, 11/26/2022   Pneumococcal Conjugate-13 12/02/2012   Pneumococcal Polysaccharide-23 06/11/2017   Respiratory Syncytial Virus Vaccine,Recomb Aduvanted(Arexvy) 11/26/2021   Rsv, Bivalent, Protein Subunit Rsvpref,pf Pattricia Bores) 11/26/2021   Td 11/20/2004, 12/21/2010   Tdap 12/21/2010, 11/26/2022        Objective:     BP 110/80 (BP Location: Right Arm, Cuff Size: Large)   Pulse 78   Temp (!) 97.5 F (36.4 C)   Ht 5\' 9"  (1.753 m)   Wt 218 lb 9.6 oz (99.2 kg)   SpO2 91%   BMI 32.28 kg/m   SpO2: 91 % O2 Device: None (Room air)  GENERAL: Obese gentleman, no acute distress, fully ambulatory, no conversational dyspnea. HEAD: Normocephalic, atraumatic.  EYES: Pupils equal, round, reactive to light.  No scleral icterus.  MOUTH: Dentition intact, Mallampati III airway, no thrush. NECK: Supple. No thyromegaly. Trachea midline. No JVD.  No adenopathy. PULMONARY: Good air entry bilaterally.  Faint crackles at bases, there is also some associated wheezing. CARDIOVASCULAR: S1 and S2. Regular rate and rhythm.  Cards normal ABDOMEN: Obese, otherwise benign. MUSCULOSKELETAL: No joint deformity, no clubbing, no edema.  NEUROLOGIC: No overt focal deficit, no gait disturbance, speech is fluent. SKIN: Intact,warm,dry. PSYCH: Anxious mood.  Nebulizer therapy with DuoNeb provided for the patient: Auscultation after DuoNeb  therapy showed resolution of wheezing/bronchospasm.  Patient noted some improvement of symptoms.  Ambulatory oxymetry was performed today:  At rest on room air oxygen saturation was 91%, the patient ambulated at a moderate pace, completed 1 laps, O2 nadir 81% , severe shortness of breath.  Resting heart rate was 79 bpm at maximum for this portion of the exercise was 95 bpm.  Patient was placed on oxygen titrated up to 4 L/min still with desaturations to 87% during ambulation required 5 L/min to maintain 98 to 92% saturation.       Assessment & Plan:     ICD-10-CM   1. SOB (shortness of breath)  R06.02 Pulmonary function test    Alpha-1 antitrypsin phenotype    AMB REFERRAL FOR DME    2. Chronic respiratory failure with hypoxia (HCC)  J96.11 AMB REFERRAL FOR DME    3. Pulmonary fibrosis (HCC)  J84.10     4. 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 Pulmonary HTN (HCC)  I27.20     6. Wheezing  R06.2 ipratropium-albuterol  (DUONEB) 0.5-2.5 (3) MG/3ML nebulizer solution 3 mL      Orders Placed This Encounter  Procedures   Alpha-1 antitrypsin phenotype    Will collect at Dr. Romeo Co' office    Standing Status:   Future    Expiration Date:   06/03/2024   AMB REFERRAL FOR DME    Referral Priority:   Routine    Referral Type:   Durable Medical Equipment Purchase    Number of Visits Requested:   1   Pulmonary function test    Standing Status:   Future    Expected Date:   07/04/2023    Expiration Date:   06/03/2024    Where should this test be performed?:   Outpatient Pulmonary    What type of PFT is being ordered?:   Full PFT    Meds ordered this encounter  Medications   ipratropium-albuterol  (DUONEB) 0.5-2.5 (3) MG/3ML nebulizer solution 3 mL   albuterol  (VENTOLIN  HFA) 108 (90 Base) MCG/ACT inhaler    Sig: Inhale 2 puffs into the lungs every 6 (six) hours  as needed for wheezing or shortness of breath.    Dispense:  8 g    Refill:  2   Fluticasone -Umeclidin-Vilant (TRELEGY ELLIPTA ) 100-62.5-25 MCG/ACT AEPB    Sig: Inhale 1 puff into the lungs daily.    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 Symptoms include dyspnea and cough, necessitating oxygen therapy and pulmonary rehabilitation. - Set up oxygen therapy - Initiate pulmonary rehabilitation - Prescribe Trelegy inhaler, one puff once daily, with instructions to rinse mouth after use  Shortness of breath Multifactorial dyspnea due to pulmonary fibrosis, recent pneumonia, and untreated sleep apnea, exacerbated by lack of oxygen therapy and physical deconditioning. Oxygen therapy is anticipated to improve symptoms. - Set up oxygen therapy - Initiate pulmonary rehabilitation - In lab sleep study once stabilized  Pneumonia Recent pneumonia in February with persistent cough and sputum production. Completed two courses of prednisone  and antibiotics with temporary improvement. Current sputum is light brown. High flow oxygen therapy is currently required. No evidence of pneumonia on F/U CXR.  Sleep apnea Sleep apnea diagnosed in 2018 with no equipment obtained due to insurance issues. Sleep study deferred until stabilization of current respiratory issues. - Plan for future sleep study after stabilization of current respiratory issues     Advised if symptoms do not improve or worsen, to please contact office for sooner follow up or seek emergency care.    I spent 60 minutes of dedicated to the care of this patient on the date of this encounter to include pre-visit review of records, face-to-face time with  the patient discussing conditions above, post visit ordering of testing, clinical documentation with the electronic health record, making appropriate referrals as documented, and  communicating necessary findings to members of the patients care team.   C. Chloe Counter, MD Advanced Bronchoscopy [facility]    *This note was dictated using voice recognition software/Dragon.  Despite best efforts to proofread, errors can occur which can change the meaning. Any transcriptional errors that result from this process are unintentional and may not be fully corrected at the time of dictation.

## 2023-06-04 NOTE — Telephone Encounter (Signed)
 Best thing would be for them to do reassessment of best fit for him.  So they can do the assessment in their office.

## 2023-06-04 NOTE — Patient Instructions (Addendum)
 VISIT SUMMARY:  During today's visit, we discussed your ongoing respiratory symptoms following your recent pneumonia. We reviewed your history of pulmonary fibrosis and sleep apnea, and noted the significant impact these conditions have on your daily activities. We also discussed your past medical history, including your smoking history and occupational exposures, which likely contributed to your current condition.  YOUR PLAN:  -PULMONARY FIBROSIS/COPD: Pulmonary fibrosis is a condition where the lung tissue becomes scarred and stiff, making it difficult to breathe. We will start you on oxygen therapy and initiate pulmonary rehabilitation to help improve your breathing. Additionally, you will use a Trelegy inhaler once daily; remember to rinse your mouth after each use.  You have also been prescribed albuterol  inhaler to use for when needed.  You can use 2 puffs 4 times a day as needed.  You declined pulmonary rehab at this time.  -SHORTNESS OF BREATH: Your shortness of breath is due to a combination of pulmonary fibrosis, recent pneumonia, and untreated sleep apnea. Starting oxygen therapy and pulmonary rehabilitation should help alleviate these symptoms. You declined pulmonary rehab at this time.  -PNEUMONIA: You recently had pneumonia, which caused a persistent cough and sputum production. Although you completed two courses of prednisone  and antibiotics, you still have symptoms. We will continue to monitor your condition and provide high flow oxygen therapy as needed.  -SLEEP APNEA: Sleep apnea is a condition where your breathing repeatedly stops and starts during sleep. Although you were diagnosed in 2018, you have not yet obtained the necessary equipment. We will plan for a sleep study once your current respiratory issues are stabilized.  INSTRUCTIONS:  Please follow up with us  after starting oxygen therapy. We will also schedule a sleep study once your respiratory condition has stabilized.

## 2023-06-05 ENCOUNTER — Encounter: Payer: Self-pay | Admitting: Pulmonary Disease

## 2023-06-05 ENCOUNTER — Other Ambulatory Visit: Payer: Self-pay

## 2023-06-05 NOTE — Telephone Encounter (Signed)
Just an FYI.  Nothing further needed.

## 2023-06-05 NOTE — Telephone Encounter (Signed)
 I have sent Paul Johnston with [employer] Dr. Isidro Margo response. Waiting for her response

## 2023-06-05 NOTE — Telephone Encounter (Signed)
 Paul Johnston, can let [name] know this? Thank you!

## 2023-06-05 NOTE — Telephone Encounter (Signed)
 If the Trelegy is helping him we can go ahead and send a prescription for him.  That way he will not run out before his follow-up.

## 2023-06-05 NOTE — Telephone Encounter (Signed)
 We can give him an extra box of the Trelegy.  That will hold until we see him again.

## 2023-06-05 NOTE — Telephone Encounter (Signed)
 I have received Paul Johnston with [employer] response RE: New 02 start Received: Today Shirline Dover   Got it, So we will start him with a concentrator and a home fill with fillable tanks and then do a best fit in office with a POC/Pulse dose reg.

## 2023-06-06 ENCOUNTER — Encounter: Payer: Self-pay | Admitting: Pulmonary Disease

## 2023-06-06 DIAGNOSIS — J449 Chronic obstructive pulmonary disease, unspecified: Secondary | ICD-10-CM | POA: Diagnosis not present

## 2023-06-06 NOTE — Telephone Encounter (Signed)
 Please have the dad evaluate him for a POC.  Also please have that provide him with tanks that will last him.

## 2023-06-09 ENCOUNTER — Encounter: Payer: Self-pay | Admitting: Family Medicine

## 2023-06-09 DIAGNOSIS — J849 Interstitial pulmonary disease, unspecified: Secondary | ICD-10-CM

## 2023-06-09 DIAGNOSIS — R0600 Dyspnea, unspecified: Secondary | ICD-10-CM

## 2023-06-09 DIAGNOSIS — J8410 Pulmonary fibrosis, unspecified: Secondary | ICD-10-CM

## 2023-06-09 DIAGNOSIS — E1169 Type 2 diabetes mellitus with other specified complication: Secondary | ICD-10-CM

## 2023-06-09 DIAGNOSIS — J449 Chronic obstructive pulmonary disease, unspecified: Secondary | ICD-10-CM

## 2023-06-09 DIAGNOSIS — Z5181 Encounter for therapeutic drug level monitoring: Secondary | ICD-10-CM

## 2023-06-12 ENCOUNTER — Telehealth: Payer: Self-pay

## 2023-06-12 DIAGNOSIS — J9611 Chronic respiratory failure with hypoxia: Secondary | ICD-10-CM

## 2023-06-12 NOTE — Telephone Encounter (Signed)
 Yes, ok to place order to add humidifier. Thanks.

## 2023-06-12 NOTE — Telephone Encounter (Signed)
 I spoke with the patient. He said he got a call from the deliver driver from [employer] trying to deliver a POC to him and take his continuous O2 tanks.  He said he has not been walked on the POC like Dr. Viva Grise had wanted.  I spoke to [employer]. They will cancel the delivery for today and call him to schedule a POC walk test.   I have notified the patient. He also mentioned that he is waking up with a dry nose and dry blood in his nose.  Dr. Viva Grise is out of the office. Alston Jerry can you okay me placing an order for a humidifier for his O2 Concentrator?

## 2023-06-12 NOTE — Telephone Encounter (Signed)
 Copied from CRM [id number]. Topic: Clinical - Medical Advice >> Jun 12, 2023  3:08 PM [name] wrote: Reason for CRM: Patient is requesting to speak with Merritt Ables, in regard to the driver delivering a battery operated oxygen machine.

## 2023-06-12 NOTE — Telephone Encounter (Signed)
 Copied from CRM [id number]. Topic: Clinical - Medical Advice >> Jun 12, 2023  3:08 PM [name] wrote: Reason for CRM: Patient is requesting to speak with Merritt Ables, in regard to the driver delivering a battery operated oxygen machine. >> Jun 12, 2023  4:05 PM Ilene Malling wrote: Patient [phone] wants to speak with Merritt Ables about oxygen concentrator and tank. Patient states Merritt Ables knows, and needs to speak with her as soon as possible today. Please call back.

## 2023-06-12 NOTE — Telephone Encounter (Signed)
 See telephone encounter from today (5/1.  Nothing further needed.

## 2023-06-12 NOTE — Telephone Encounter (Signed)
Order has been placed. Nothing further needed. 

## 2023-06-13 ENCOUNTER — Other Ambulatory Visit: Payer: Self-pay

## 2023-06-13 DIAGNOSIS — J8410 Pulmonary fibrosis, unspecified: Secondary | ICD-10-CM | POA: Diagnosis not present

## 2023-06-13 DIAGNOSIS — E1169 Type 2 diabetes mellitus with other specified complication: Secondary | ICD-10-CM | POA: Diagnosis not present

## 2023-06-13 DIAGNOSIS — J449 Chronic obstructive pulmonary disease, unspecified: Secondary | ICD-10-CM

## 2023-06-13 DIAGNOSIS — J849 Interstitial pulmonary disease, unspecified: Secondary | ICD-10-CM | POA: Diagnosis not present

## 2023-06-13 DIAGNOSIS — E785 Hyperlipidemia, unspecified: Secondary | ICD-10-CM | POA: Diagnosis not present

## 2023-06-13 DIAGNOSIS — Z5181 Encounter for therapeutic drug level monitoring: Secondary | ICD-10-CM

## 2023-06-13 DIAGNOSIS — R0600 Dyspnea, unspecified: Secondary | ICD-10-CM

## 2023-06-18 LAB — BASIC METABOLIC PANEL WITH GFR
BUN: 15 mg/dL (ref 7–25)
CO2: 29 mmol/L (ref 20–32)
Calcium: 9.6 mg/dL (ref 8.6–10.3)
Chloride: 98 mmol/L (ref 98–110)
Creat: 1 mg/dL (ref 0.70–1.28)
Glucose, Bld: 102 mg/dL (ref 65–139)
Potassium: 4.1 mmol/L (ref 3.5–5.3)
Sodium: 139 mmol/L (ref 135–146)
eGFR: 80 mL/min/{1.73_m2} (ref >=60)

## 2023-06-18 LAB — HEPATIC FUNCTION PANEL
AG Ratio: 1.5 (calc) (ref 1.0–2.5)
ALT: 24 U/L (ref 9–46)
AST: 21 U/L (ref 10–35)
Albumin: 4.2 g/dL (ref 3.6–5.1)
Alkaline phosphatase (APISO): 45 U/L (ref 35–144)
Bilirubin, Direct: 0.2 mg/dL (ref 0.0–0.2)
Globulin: 2.8 g/dL (ref 1.9–3.7)
Indirect Bilirubin: 0.4 mg/dL (ref 0.2–1.2)
Total Bilirubin: 0.6 mg/dL (ref 0.2–1.2)
Total Protein: 7 g/dL (ref 6.1–8.1)

## 2023-06-18 LAB — HEMOGLOBIN A1C
Hgb A1c MFr Bld: 6.6 % — ABNORMAL HIGH (ref <5.7)
Mean Plasma Glucose: 143 mg/dL
eAG (mmol/L): 7.9 mmol/L

## 2023-06-18 LAB — ALPHA-1 ANTITRYPSIN PHENOTYPE: A-1 Antitrypsin, Ser: 203 mg/dL — ABNORMAL HIGH (ref 83–199)

## 2023-06-20 ENCOUNTER — Ambulatory Visit: Payer: Self-pay | Admitting: Family Medicine

## 2023-06-23 ENCOUNTER — Encounter: Payer: Self-pay | Admitting: Family Medicine

## 2023-06-23 ENCOUNTER — Other Ambulatory Visit: Payer: Self-pay | Admitting: Family Medicine

## 2023-06-23 ENCOUNTER — Ambulatory Visit: Admitting: Family Medicine

## 2023-06-23 VITALS — BP 144/80 | HR 94 | Ht 69.0 in | Wt 216.1 lb

## 2023-06-23 DIAGNOSIS — E1169 Type 2 diabetes mellitus with other specified complication: Secondary | ICD-10-CM

## 2023-06-23 DIAGNOSIS — Z23 Encounter for immunization: Secondary | ICD-10-CM | POA: Diagnosis not present

## 2023-06-23 DIAGNOSIS — J8410 Pulmonary fibrosis, unspecified: Secondary | ICD-10-CM

## 2023-06-23 DIAGNOSIS — N400 Enlarged prostate without lower urinary tract symptoms: Secondary | ICD-10-CM

## 2023-06-23 DIAGNOSIS — Z7984 Long term (current) use of oral hypoglycemic drugs: Secondary | ICD-10-CM

## 2023-06-23 DIAGNOSIS — E785 Hyperlipidemia, unspecified: Secondary | ICD-10-CM

## 2023-06-23 DIAGNOSIS — Z0000 Encounter for general adult medical examination without abnormal findings: Secondary | ICD-10-CM

## 2023-06-23 DIAGNOSIS — J849 Interstitial pulmonary disease, unspecified: Secondary | ICD-10-CM

## 2023-06-23 NOTE — Patient Instructions (Addendum)
 Thank you for coming to the office today.  Please keep working with Pulmonology and Oxygen supplier to discuss options for Oxygen Concentration.  DUE for FASTING BLOOD WORK (no food or drink after midnight before the lab appointment, only water or coffee without cream/sugar on the morning of)  SCHEDULE "Lab Only" visit in the morning at the clinic for lab draw in 6 MONTHS   - Make sure Lab Only appointment is at about 1 week before your next appointment, so that results will be available  For Lab Results, once available within 2-3 days of blood draw, you can can log in to MyChart online to view your results and a brief explanation. Also, we can discuss results at next follow-up visit.   Please schedule a Follow-up Appointment to: Return for 6 month fasting lab > 1 week later Annual Physical.  If you have any other questions or concerns, please feel free to call the office or send a message through MyChart. You may also schedule an earlier appointment if necessary.  Additionally, you may be receiving a survey about your experience at our office within a few days to 1 week by e-mail or mail. We value your feedback.  Domingo Friend, DO [facility], [place]

## 2023-06-23 NOTE — Progress Notes (Signed)
 Subjective:    Patient ID: Paul Johnston, male    DOB: [birth year], 73 y.o.   MRN: [id number]  Paul Johnston is a 73 y.o. male presenting on 06/23/2023 for Shortness of Breath (Pulmonary Fibrosis)   HPI  Discussed the use of AI scribe software for clinical note transcription with the patient, who gave verbal consent to proceed.  History of Present Illness   Paul Uehara "Gene" is a 73 year old male who presents for a routine follow-up and lab review.  He had pneumonia in February that extended into March, and he has not fully recovered since then. His breathing has not been terrible, but he has not fully recovered from the pneumonia. He is currently on several medications, including Trelegy once daily and albuterol  as a rescue inhaler, although he has not needed to use the albuterol .  He takes metformin  for blood sugar control, which is stable at the moment. He previously tried increasing the metformin  dose to twice daily but experienced diarrhea, so he returned to once daily dosing. His recent lab results show that his A1c has decreased from 6.8 to 6.6. There was a previous concern about kidney function, but recent tests show that his creatinine levels have returned to normal. His liver enzymes are normal, which is monitored due to his medication use.  He is frustrated with his current oxygen therapy. He recently failed a walk test, which prevents him from obtaining a battery-operated portable oxygen concentrator (POC) that would allow more mobility. He feels 'tied to the oxygen' and compares it to being on 'house arrest.' He is concerned about running out of oxygen during travel and the limitations it imposes on his quality of life. His oxygen levels drop significantly when not using supplemental oxygen, causing his heart rate to increase. He monitors his oxygen levels at home with an oximeter, noting that they can drop into the seventies or eighties when he is active without oxygen. He is concerned  about the risks of low oxygen levels, especially while driving.  He is considering the timing of his next COVID-19 booster shot, as his last one was in October. He has received the RSV vaccine, which is currently a one-time dose.       Interstitial Lung Disease / Pulmonary FIbrosis Followed by Pulmonology Still admits increased dyspnea with progression of ILD. He is on Esbriet , considering future treatment options   Vitamin D  Deficiency Continue on D3 supplement   CHRONIC DM, Type 2 Obestiy BMI >34 A1c at 6.6 improved CBGs: Infrequent checks, none recently. Meds: Metformin  XR 500mg  once daily Reports good compliance. Tolerating well w/o side-effects Currently not on ACEi/ARB Update DM Eye Exam [place] Denies hypoglycemia, polyuria, visual changes, numbness or tingling.  Resolved Creatinine elevation - normalized now.      04/29/2023    2:13 PM 03/14/2023    3:23 PM 12/13/2022    3:09 PM  Depression screen PHQ 2/9  Decreased Interest 1 0 0  Down, Depressed, Hopeless 0 1 1  PHQ - 2 Score 1 1 1   Altered sleeping 1 0 1  Tired, decreased energy 1 0 1  Change in appetite 0 0 0  Feeling bad or failure about yourself  0 0 0  Trouble concentrating 0 0 0  Moving slowly or fidgety/restless 0 0 0  Suicidal thoughts 0 0 0  PHQ-9 Score 3 1 3   Difficult doing work/chores Not difficult at all Not difficult at all Not difficult at all  04/29/2023    2:13 PM 12/13/2022    3:10 PM 06/05/2022    3:44 PM 11/30/2021    3:45 PM  GAD 7 : Generalized Anxiety Score  Nervous, Anxious, on Edge 0 0 0 0  Control/stop worrying 0 0 0 0  Worry too much - different things 0 0 0 0  Trouble relaxing 0 0 0 0  Restless 0 0 0 0  Easily annoyed or irritable 0 0 0 0  Afraid - awful might happen 0 0 0 0  Total GAD 7 Score 0 0 0 0  Anxiety Difficulty    Not difficult at all    Social History   Tobacco Use  . Smoking status: Former    Current packs/day: 0.00    Average packs/day: 1 pack/day  for 33.0 years (33.0 ttl pk-yrs)    Types: Cigarettes    Start date: 09/07/1975    Quit date: 09/06/2008    Years since quitting: 14.8    Passive exposure: Past  . Smokeless tobacco: Former  Advertising account planner  . Vaping status: Never Used  Substance Use Topics  . Alcohol use: No  . Drug use: No    Review of Systems Per HPI unless specifically indicated above     Objective:     BP (!) 144/80 (BP Location: Left Arm, Patient Position: Sitting, Cuff Size: Normal)   Pulse 94   Ht 5\' 9"  (1.753 m)   Wt 216 lb 2 oz (98 kg)   SpO2 98%   BMI 31.92 kg/m   Wt Readings from Last 3 Encounters:  06/23/23 216 lb 2 oz (98 kg)  06/04/23 218 lb 9.6 oz (99.2 kg)  04/29/23 221 lb (100.2 kg)    Physical Exam Vitals and nursing note reviewed.  Constitutional:      General: He is not in acute distress.    Appearance: He is well-developed. He is obese. He is not diaphoretic.     Comments: Well-appearing, comfortable, cooperative  HENT:     Head: Normocephalic and atraumatic.  Eyes:     General:        Right eye: No discharge.        Left eye: No discharge.     Conjunctiva/sclera: Conjunctivae normal.  Neck:     Thyroid : No thyromegaly.  Cardiovascular:     Rate and Rhythm: Normal rate and regular rhythm.     Pulses: Normal pulses.     Heart sounds: Normal heart sounds. No murmur heard. Pulmonary:     Effort: Pulmonary effort is normal. No respiratory distress.     Breath sounds: Normal breath sounds. No wheezing or rales.     Comments: Continuous Oxygen via nasal cannula, oxygen tank Musculoskeletal:        General: Normal range of motion.     Cervical back: Normal range of motion and neck supple.  Lymphadenopathy:     Cervical: No cervical adenopathy.  Skin:    General: Skin is warm and dry.     Findings: No erythema or rash.  Neurological:     Mental Status: He is alert and oriented to person, place, and time. Mental status is at baseline.  Psychiatric:        Behavior: Behavior  normal.     Comments: Well groomed, good eye contact, normal speech and thoughts    Results for orders placed or performed in visit on 06/13/23  Basic metabolic panel with GFR   Collection Time: 06/13/23  2:49 PM  Result Value Ref Range   Glucose, Bld 102 65 - 139 mg/dL   BUN 15 7 - 25 mg/dL   Creat 9.60 4.54 - 0.98 mg/dL   eGFR 80 > OR = 60 [id number]   BUN/Creatinine Ratio SEE NOTE: 6 - 22 (calc)   Sodium 139 135 - 146 mmol/L   Potassium 4.1 3.5 - 5.3 mmol/L   Chloride 98 98 - 110 mmol/L   CO2 29 20 - 32 mmol/L   Calcium  9.6 8.6 - 10.3 mg/dL  Hepatic function panel   Collection Time: 06/13/23  2:49 PM  Result Value Ref Range   Total Protein 7.0 6.1 - 8.1 g/dL   Albumin 4.2 3.6 - 5.1 g/dL   Globulin 2.8 1.9 - 3.7 g/dL (calc)   AG Ratio 1.5 1.0 - 2.5 (calc)   Total Bilirubin 0.6 0.2 - 1.2 mg/dL   Bilirubin, Direct 0.2 0.0 - 0.2 mg/dL   Indirect Bilirubin 0.4 0.2 - 1.2 mg/dL (calc)   Alkaline phosphatase (APISO) 45 35 - 144 U/L   AST 21 10 - 35 U/L   ALT 24 9 - 46 U/L  Hemoglobin A1c   Collection Time: 06/13/23  2:49 PM  Result Value Ref Range   Hgb A1c MFr Bld 6.6 (H) <5.7 %   Mean Plasma Glucose 143 mg/dL   eAG (mmol/L) 7.9 mmol/L  Alpha-1 antitrypsin phenotype   Collection Time: 06/13/23  2:49 PM  Result Value Ref Range   A-1 Antitrypsin, Ser 203 (H) 83 - 199 mg/dL   [id number] (AAT) PHENOTYPE SEE NOTE       Assessment & Plan:   Problem List Items Addressed This Visit     Hyperlipidemia associated with type 2 diabetes mellitus (HCC)   Interstitial lung disease (HCC)   Pulmonary fibrosis (HCC)   Type 2 diabetes mellitus with other specified complication (HCC) - Primary   Other Visit Diagnoses       Need for Streptococcus pneumoniae vaccination       Relevant Orders   Pneumococcal conjugate vaccine 20-valent (Completed)     Long term current use of oral hypoglycemic drug            Wellness Visit Routine wellness visit with stable diabetes  management. Kidney function normalized. Liver enzymes normal. - Administer Prevnar pneumonia vaccine. - Schedule follow-up appointment in October with blood work a week prior.  Pulm Fibrosis / ILD / Chronic respiratory failure Followed by Pulmonology Managed with continuous oxygen therapy. Limitations due to oxygen tank reliance. Current oxygen needs exceed portable device capacity, affecting mobility and quality of life. - Limited options as we discussed. If he is looking for portable oxygen concentrator for transportation purposes if he runs out of continuous oxygen, then he can consider out-of-pocket purchase of portable oxygen concentrator. However problem is this type of oxygen will unlikely be enough to manage his condition, but his concern is if he runs out of non portable oxygen and cannot get back home for the refill  COPD Confirmed by pulmonologist. Trelegy inhaler and albuterol  for rescue.  History of Pneumonia Recent pneumonia in February and March with ongoing breathing difficulties. Pneumonia vaccine recommended to prevent recurrence.  Diabetes mellitus type 2 Well-managed with A1c decrease from 6.8 to 6.6. Metformin  effective at current dosage.  Goals of Care Concerns about quality of life due to oxygen therapy reliance. Willing to consider out-of-pocket expenses for portable oxygen concentrator. Considering [facility] benefits for cost savings and pulmonology care access.  Orders Placed This Encounter  Procedures  . Pneumococcal conjugate vaccine 20-valent    No orders of the defined types were placed in this encounter.   Follow up plan: Return for 6 month fasting lab > 1 week later Annual Physical.  Future labs ordered for 12/08/23  Domingo Friend, DO [facility] 06/23/2023, 2:31 PM

## 2023-07-06 DIAGNOSIS — J449 Chronic obstructive pulmonary disease, unspecified: Secondary | ICD-10-CM | POA: Diagnosis not present

## 2023-07-10 ENCOUNTER — Encounter: Payer: Self-pay | Admitting: Pulmonary Disease

## 2023-07-10 ENCOUNTER — Ambulatory Visit: Admitting: Pulmonary Disease

## 2023-07-10 VITALS — BP 100/50 | HR 80 | Ht 69.0 in | Wt 215.8 lb

## 2023-07-10 DIAGNOSIS — J849 Interstitial pulmonary disease, unspecified: Secondary | ICD-10-CM | POA: Diagnosis not present

## 2023-07-10 DIAGNOSIS — I272 Pulmonary hypertension, unspecified: Secondary | ICD-10-CM

## 2023-07-10 DIAGNOSIS — J449 Chronic obstructive pulmonary disease, unspecified: Secondary | ICD-10-CM

## 2023-07-10 DIAGNOSIS — R0602 Shortness of breath: Secondary | ICD-10-CM | POA: Diagnosis not present

## 2023-07-10 DIAGNOSIS — G4733 Obstructive sleep apnea (adult) (pediatric): Secondary | ICD-10-CM

## 2023-07-10 DIAGNOSIS — J9611 Chronic respiratory failure with hypoxia: Secondary | ICD-10-CM | POA: Diagnosis not present

## 2023-07-10 LAB — PULMONARY FUNCTION TEST
DL/VA % pred: 52 %
DL/VA: 2.1 ml/min/mmHg/L
DLCO unc % pred: 32 %
DLCO unc: 8.15 ml/min/mmHg
FEF 25-75 Post: 2.94 L/s
FEF 25-75 Pre: 2.83 L/s
FEF2575-%Change-Post: 3 %
FEF2575-%Pred-Post: 126 %
FEF2575-%Pred-Pre: 121 %
FEV1-%Change-Post: 1 %
FEV1-%Pred-Post: 73 %
FEV1-%Pred-Pre: 72 %
FEV1-Post: 2.28 L
FEV1-Pre: 2.25 L
FEV1FVC-%Change-Post: -1 %
FEV1FVC-%Pred-Pre: 114 %
FEV6-%Change-Post: 2 %
FEV6-%Pred-Post: 68 %
FEV6-%Pred-Pre: 67 %
FEV6-Post: 2.74 L
FEV6-Pre: 2.67 L
FEV6FVC-%Change-Post: 0 %
FEV6FVC-%Pred-Post: 106 %
FEV6FVC-%Pred-Pre: 106 %
FVC-%Change-Post: 2 %
FVC-%Pred-Post: 64 %
FVC-%Pred-Pre: 63 %
FVC-Post: 2.74 L
FVC-Pre: 2.68 L
Post FEV1/FVC ratio: 83 %
Post FEV6/FVC ratio: 100 %
Pre FEV1/FVC ratio: 84 %
Pre FEV6/FVC Ratio: 100 %
RV % pred: 56 %
RV: 1.38 L
TLC % pred: 60 %
TLC: 4.14 L

## 2023-07-10 MED ORDER — BREZTRI AEROSPHERE 160-9-4.8 MCG/ACT IN AERO: 2.0000 | INHALATION_SPRAY | Freq: Two times a day (BID) | RESPIRATORY_TRACT | Status: DC

## 2023-07-10 NOTE — Progress Notes (Signed)
 Subjective:    Patient ID: Paul Johnston, male    DOB: January 22, 1951, 73 y.o.   MRN: [id number]  Patient Care Team: Raina Bunting, DO as PCP - General (Family Medicine) Julia Oats, OD [facility])  Chief Complaint  Patient presents with   Follow-up    PFT done today. Breathing improved some on the o2.     BACKGROUND/INTERVAL:Patient is a 73 year old former smoker with a history of pulmonary fibrosis previously followed by Dr. Praveen Mannam at our [facility] location.  Patient transferred care to our [facility] location first visit with me was on 04 June 2023.  At that time the patient was noted to have severe issues with hypoxemia.  He was started on oxygen supplementation.  HPI Discussed the use of AI scribe software for clinical note transcription with the patient, who gave verbal consent to proceed.  History of Present Illness   Paul Johnston "Gene" is a 73 year old male with pulmonary fibrosis and COPD who presents for evaluation of his oxygen therapy and shortness of breath.  He has a history of pulmonary fibrosis and COPD, with recent test results showing a slight improvement compared to study of August 2023. He is currently on oxygen therapy and has experimented with different flow rates. At rest, his oxygen saturation remains stable at 2 liters, but he experiences shortness of breath with activity regardless of the oxygen setting.  He uses a Trelegy inhaler once daily, which he finds beneficial as he was not on any inhaler previously. He also uses albuterol  as a rescue inhaler and has used it twice. He becomes short of breath with any activity, such as walking or carrying his oxygen tank.  He had an echocardiogram in February, a high-resolution CT scan last July, and a sleep study in 2018 which confirmed sleep apnea. He is not currently using any treatment for sleep apnea.  He was previously diagnosed with pneumonia and broncho pneumonia, which he believes exacerbated  his current respiratory issues. He was treated with antibiotics for pneumonia after a CT scan in February.  He is on losartan  25 mg for hypertension but is concerned about taking it due to a recent low blood pressure reading of 88/?. His usual blood pressure is in the 130s/70s range.  He has concerns about his oxygen supply, particularly when traveling, as he has three home refill tanks and is considering options for portable oxygen solutions. He wants to spend quality time with his family and engage in activities like going to the beach.     PFTs: 09/11/2021 FVC 2.03 [70%], FEV1 2.41 [76%], F/F 79, TLC 5.68 [83%], DLCO 13.43 [53%] Moderate diffusion defect 07/10/2023 FEV1 2.25 L or 72% predicted, FVC 2.68 L or 63% predicted, FEV1/FVC 84% moderate reduction in lung volumes diffusion capacity severely reduced.   08/13/2022 FVC 2.87 [61%], FEV1 2.33 [94%], F/F81, TLC 5.49 [80%], DLCO 10.35 [41%] Moderate diffusion defect   Labs: CTD serologies 07/05/2021-positive only for double-stranded DNA   Hepatic panel 03/25/2022-within normal limits   Review of Systems A 10 point review of systems was performed and it is as noted above otherwise negative.   Patient Active Problem List   Diagnosis Date Noted   Interstitial lung disease (HCC) 08/13/2022   Colon cancer screening 03/08/2022   Diarrhea 03/08/2022   Flatulence, eructation and gas pain 03/08/2022   Gastroesophageal reflux disease 03/08/2022   Pulmonary fibrosis (HCC) 11/30/2021   Tinnitus, bilateral 01/27/2019   Non-melanoma skin cancer 09/15/2018   History of  sleep apnea 09/15/2016   Type 2 diabetes mellitus with other specified complication (HCC)    Obesity (BMI 30.0-34.9) 12/09/2013   GERD (gastroesophageal reflux disease) 09/01/2012   Vitamin D  deficiency 09/01/2012   Solitary pulmonary nodule, history of, left side 09/01/2012   BPH (benign prostatic hyperplasia) 09/01/2012   Umbilical hernia 07/03/2012   Hyperlipidemia  associated with type 2 diabetes mellitus (HCC) 12/14/2008   Tobacco use disorder in remission 12/14/2008    Social History   Tobacco Use   Smoking status: Former    Current packs/day: 0.00    Average packs/day: 1 pack/day for 33.0 years (33.0 ttl pk-yrs)    Types: Cigarettes    Start date: 09/07/1975    Quit date: 09/06/2008    Years since quitting: 14.8    Passive exposure: Past   Smokeless tobacco: Former  Substance Use Topics   Alcohol use: No    Allergies  Allergen Reactions   Codeine    Biaxin [Clarithromycin] Other (See Comments)    GI upset    Current Meds  Medication Sig   albuterol  (VENTOLIN  HFA) 108 (90 Base) MCG/ACT inhaler Inhale 2 puffs into the lungs every 6 (six) hours as needed for wheezing or shortness of breath.   budesonide-glycopyrrolate-formoterol (BREZTRI  AEROSPHERE) 160-9-4.8 MCG/ACT AERO inhaler Inhale 2 puffs into the lungs in the morning and at bedtime.   Cholecalciferol (VITAMIN D3) 50 MCG (2000 UT) TABS Take 2,000 Units by mouth daily.   ESBRIET  801 MG TABS Take 1 tablet (801 mg total) by mouth 3 (three) times daily with meals.   Fluticasone -Umeclidin-Vilant (TRELEGY ELLIPTA ) 100-62.5-25 MCG/ACT AEPB Inhale 1 puff into the lungs daily.   Lancets (ONETOUCH ULTRASOFT) lancets Use as instructed   losartan  (COZAAR ) 25 MG tablet Take 1 tablet (25 mg total) by mouth daily.   metFORMIN  (GLUCOPHAGE -XR) 500 MG 24 hr tablet Take 1 tablet (500 mg total) by mouth daily with supper.   omeprazole  (PRILOSEC) 40 MG capsule Take 1 capsule (40 mg total) by mouth daily before breakfast.   ONETOUCH VERIO test strip Check blood sugar up to 2 times daily as instructed   rosuvastatin  (CRESTOR ) 20 MG tablet Take 1 tablet (20 mg total) by mouth daily.    Immunization History  Administered Date(s) Administered   Fluad Quad(high Dose 65+) 12/08/2018, 12/09/2019, 11/28/2020, 11/30/2021   Influenza, High Dose Seasonal PF 12/12/2016, 12/17/2017, 11/26/2022    Influenza,inj,Quad PF,6+ Mos 11/18/2012, 12/21/2015   PFIZER Comirnaty(Gray Top)Covid-19 Tri-Sucrose Vaccine 06/29/2020   PFIZER(Purple Top)SARS-COV-2 Vaccination 03/20/2019, 04/10/2019, 11/18/2019   PNEUMOCOCCAL CONJUGATE-20 06/23/2023   Pfizer Covid-19 Vaccine Bivalent Booster 59yrs & up 11/17/2020   Pfizer Covid-19 Vaccine Bivalent Booster 5y-11y 11/26/2021   Pfizer(Comirnaty)Fall Seasonal Vaccine 12 years and older 11/26/2021, 11/26/2022   Pneumococcal Conjugate-13 12/02/2012   Pneumococcal Polysaccharide-23 06/11/2017   Respiratory Syncytial Virus Vaccine,Recomb Aduvanted(Arexvy) 11/26/2021   Rsv, Bivalent, Protein Subunit Rsvpref,pf Pattricia Bores) 11/26/2021   Td 11/20/2004, 12/21/2010   Tdap 12/21/2010, 11/26/2022        Objective:     BP (!) 100/50 (BP Location: Right Arm, Cuff Size: Normal)   Pulse 80   Ht 5\' 9"  (1.753 m)   Wt 215 lb 12.8 oz (97.9 kg)   SpO2 92%   BMI 31.87 kg/m   SpO2: 92 % O2 Device: Nasal cannula O2 Flow Rate (L/min): 2 L/min O2 Type: Continuous O2  GENERAL: Obese gentleman, no acute distress, fully ambulatory, no conversational dyspnea. HEAD: Normocephalic, atraumatic.  EYES: Pupils equal, round, reactive to light.  No  scleral icterus.  MOUTH: Dentition intact, Mallampati III airway, no thrush. NECK: Supple. No thyromegaly. Trachea midline. No JVD.  No adenopathy. PULMONARY: Good air entry bilaterally.  Faint crackles at bases, no other adventitious sounds.Aaron Aas CARDIOVASCULAR: S1 and S2. Regular rate and rhythm.  Cards normal ABDOMEN: Obese, otherwise benign. MUSCULOSKELETAL: No joint deformity, no clubbing, no edema.  NEUROLOGIC: No overt focal deficit, no gait disturbance, speech is fluent. SKIN: Intact,warm,dry. PSYCH: Anxious mood.   Assessment & Plan:     ICD-10-CM   1. ILD (interstitial lung disease) (HCC)  J84.9 Split night study    CT Chest High Resolution    2. SOB (shortness of breath)  R06.02     3. Chronic respiratory failure  with hypoxia (HCC)  J96.11 Ambulatory Referral for DME    4. COPD mixed type (HCC)  J44.9     5. OSA (obstructive sleep apnea)  G47.33     6. Pulmonary hypertension (HCC)  I27.20       Orders Placed This Encounter  Procedures   CT Chest High Resolution    Next available please    Standing Status:   Future    Number of Occurrences:   1    Expiration Date:   07/09/2024    Preferred imaging location?:   [facility]   Ambulatory Referral for DME    Referral Priority:   Routine    Referral Type:   Durable Medical Equipment Purchase    Number of Visits Requested:   1   Split night study    Standing Status:   Future    Expiration Date:   07/09/2024    Where should this test be performed::   [place]    Meds ordered this encounter  Medications   budesonide-glycopyrrolate-formoterol (BREZTRI  AEROSPHERE) 160-9-4.8 MCG/ACT AERO inhaler    Sig: Inhale 2 puffs into the lungs in the morning and at bedtime.    Lot Number?:   [id number]    Expiration Date?:   12/11/2025    Quantity:   2   Discussion:    Pulmonary fibrosis Pulmonary fibrosis with a decline in test results compared to last year. Oxygen therapy has been effective, with him able to maintain oxygen saturation at lower flow rates during rest. However, exertion leads to shortness of breath regardless of oxygen flow rate. - Order high-resolution CT scan of the chest to assess progression of pulmonary fibrosis. - Continue oxygen therapy at 2 liters per minute at rest and increase to 3 liters per minute during activity.  Chronic obstructive pulmonary disease (COPD) COPD with ongoing symptoms of shortness of breath. He is currently using Trelegy inhaler once daily and albuterol  as a rescue inhaler. He reports financial concerns regarding the cost of Trelegy. - Switch from Trelegy to Breztri  inhaler, two puffs twice daily, after finishing current supply of Trelegy. - Provide samples of Breztri  inhaler. - Continue albuterol  as  a rescue inhaler for shortness of breath.  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 He has sleep apnea, which can contribute to right-sided heart failure. - Review echocardiogram results from March 26, 2023. - Order sleep study to assess current status of sleep apnea and its impact on right heart function.  Sleep apnea Sleep apnea diagnosed in 2018. He is not currently using any treatment for sleep apnea. Sleep apnea may be contributing to right-sided heart failure and shortness of breath. - Order sleep study to  be conducted in a lab setting due to current oxygen requirements.  Hypertension Hypertension managed with losartan  25 mg. He reports low blood pressure readings during the visit, raising concerns about continuing losartan . - Advise monitoring blood pressure at home before taking losartan , especially if planning to eat out.  Goals of Care He expresses a desire to maintain quality of life and spend time with family, including traveling to the beach, despite health challenges. He is concerned about being tied to oxygen equipment and wishes for more mobility to enjoy time with family and grandchildren. - Discuss potential for different oxygen delivery systems to improve mobility and quality of life. - Explore alternative oxygen supply companies to better meet his needs.     Advised if symptoms do not improve or worsen, to please contact office for sooner follow up or seek emergency care.    I spent 45 minutes of dedicated to the care of this patient on the date of this encounter to include pre-visit review of records, face-to-face time with the patient discussing conditions above, post visit ordering of testing, clinical documentation with the electronic health record, making appropriate referrals as documented, and communicating necessary findings to members of  the patients care team.     C. Chloe Counter, MD Advanced Bronchoscopy P[facility]    *This note was generated using voice recognition software/Dragon and/or AI transcription program.  Despite best efforts to proofread, errors can occur which can change the meaning. Any transcriptional errors that result from this process are unintentional and may not be fully corrected at the time of dictation.

## 2023-07-10 NOTE — Progress Notes (Signed)
 Full PFT completed today ? ?

## 2023-07-10 NOTE — Patient Instructions (Addendum)
 VISIT SUMMARY:  Today, we discussed your ongoing respiratory issues, including pulmonary fibrosis and COPD, and evaluated your current oxygen therapy. We also reviewed your concerns about blood pressure management and the impact of sleep apnea on your overall health. Additionally, we talked about your goals for maintaining quality of life and spending time with your family.  YOUR PLAN:  -PULMONARY FIBROSIS: Pulmonary fibrosis is a condition where lung tissue becomes damaged and scarred, making it difficult to breathe. We will continue your oxygen therapy at 2 liters per minute at rest and increase to 3 liters per minute during activity. A high-resolution CT scan of your chest has been ordered to assess the progression of the disease.  -CHRONIC OBSTRUCTIVE PULMONARY DISEASE (COPD): COPD is a chronic inflammatory lung disease that obstructs airflow from the lungs. We will switch your inhaler from Trelegy to Breztri, which you will use two puffs twice daily after finishing your current supply of Trelegy. Samples of Breztri have been provided. Continue using albuterol  as a rescue inhaler for shortness of breath.  -RIGHT-SIDED HEART FAILURE: Right-sided heart failure occurs when the right side of your heart cannot pump blood effectively, often due to pulmonary hypertension. We will review your echocardiogram results and order a sleep study to assess the impact of sleep apnea on your heart function.  -SLEEP APNEA: Sleep apnea is a sleep disorder where breathing repeatedly stops and starts. It can contribute to right-sided heart failure and shortness of breath. A sleep study will be conducted in a lab setting to evaluate your current status and its impact on your health.  -HYPERTENSION: Hypertension is high blood pressure. You are currently taking losartan  25 mg, but due to recent low blood pressure readings, you should monitor your blood pressure at home before taking the medication, please discuss with your  primary care physician as to how to adjust this medication.  -GOALS OF CARE: You wish to maintain your quality of life and spend time with your family, including traveling to the beach. We will discuss potential oxygen delivery systems to improve your mobility and explore alternative oxygen supply companies to better meet your needs.  INSTRUCTIONS:  Please follow up with the high-resolution CT scan of your chest and the sleep study as ordered. Monitor your blood pressure at home before taking losartan , especially if you plan to eat out. Continue using your oxygen therapy as directed and switch to the Breztri inhaler after finishing your current supply of Trelegy. We will review your echocardiogram results and discuss potential oxygen delivery systems to improve your mobility.

## 2023-07-10 NOTE — Patient Instructions (Signed)
 Full PFT completed today ? ?

## 2023-07-11 ENCOUNTER — Ambulatory Visit: Payer: Self-pay | Admitting: Pulmonary Disease

## 2023-07-14 NOTE — Telephone Encounter (Signed)
 I think the portable oxygen concentrator that they are recommending is okay.

## 2023-07-17 ENCOUNTER — Ambulatory Visit
Admission: RE | Admit: 2023-07-17 | Discharge: 2023-07-17 | Disposition: A | Discharge status: Home / Self Care | Source: Ambulatory Visit | Attending: Pulmonary Disease | Admitting: Pulmonary Disease

## 2023-07-17 DIAGNOSIS — J432 Centrilobular emphysema: Secondary | ICD-10-CM | POA: Diagnosis not present

## 2023-07-17 DIAGNOSIS — J849 Interstitial pulmonary disease, unspecified: Secondary | ICD-10-CM | POA: Diagnosis not present

## 2023-07-17 DIAGNOSIS — R918 Other nonspecific abnormal finding of lung field: Secondary | ICD-10-CM | POA: Diagnosis not present

## 2023-07-21 ENCOUNTER — Encounter: Payer: Self-pay | Admitting: Pulmonary Disease

## 2023-07-23 ENCOUNTER — Telehealth: Payer: Self-pay

## 2023-07-23 MED ORDER — TRELEGY ELLIPTA 100-62.5-25 MCG/ACT IN AEPB: 1.0000 | INHALATION_SPRAY | Freq: Every day | RESPIRATORY_TRACT | 3 refills | Status: DC

## 2023-07-23 NOTE — Telephone Encounter (Signed)
 Copied from CRM [id number]. Topic: Clinical - Prescription Issue >> Jul 23, 2023  2:25 PM [name] wrote: Reason for CRM: Patient states if we call in Breztri  or Trelegy for a 90 day supply through [employer] order he will get a discount through his insurance.   Prefers Trelegy but will take whichever one is best for him - requesting to speak with a nurse.   Callback number: [phone]

## 2023-07-23 NOTE — Telephone Encounter (Signed)
 Okay to send in Trelegy to his pharmacy? You switched him to Breztri  due to cost.

## 2023-07-23 NOTE — Telephone Encounter (Signed)
 I have notified the patient and sent in script to [facility].  Nothing further needed.

## 2023-07-23 NOTE — Telephone Encounter (Signed)
 Okay for Trelegy 100, 90-day supply with 3 refills.

## 2023-07-25 ENCOUNTER — Encounter: Payer: Self-pay | Admitting: Pulmonary Disease

## 2023-07-25 DIAGNOSIS — I7090 Unspecified atherosclerosis: Secondary | ICD-10-CM

## 2023-07-25 DIAGNOSIS — I2721 Secondary pulmonary arterial hypertension: Secondary | ICD-10-CM

## 2023-07-28 ENCOUNTER — Ambulatory Visit: Payer: Self-pay | Admitting: Pulmonary Disease

## 2023-07-28 NOTE — Telephone Encounter (Signed)
 I did call and ask them to read his CT ASAP.

## 2023-07-28 NOTE — Telephone Encounter (Signed)
 Please note that the delay in reading of radiology studies is not only at [facility] but also throughout [facility].  His CT scan shows that there has been some progression of the scarring (fibrosis) in his lungs.  There is also evidence of increased pressure on the artery going from the heart to the lungs, this was also shown on the echocardiogram performed in February by Dr. Waylan Haggard.  This adds to his shortness of breath.  He needs to continue wearing his oxygen as this helps with this issue.  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  We can refer him to cardiology to evaluate the calcium  deposits in his heart arteries and to check on studying the pressure in the artery going from the heart to the lungs.  *This was also posted under results comments*

## 2023-07-31 ENCOUNTER — Encounter: Payer: Self-pay | Admitting: Pulmonary Disease

## 2023-08-06 DIAGNOSIS — J449 Chronic obstructive pulmonary disease, unspecified: Secondary | ICD-10-CM | POA: Diagnosis not present

## 2023-08-18 ENCOUNTER — Telehealth: Payer: Self-pay | Admitting: Cardiology

## 2023-08-18 NOTE — Telephone Encounter (Signed)
 Called to confirm/remind patient of their appointment at the [facility] on 08/19/23.   Appointment:   [x] Confirmed  [] Left mess   [] No answer/No voice mail  [] VM Full/unable to leave message  [] Phone not in service  Patient reminded to bring all medications and/or complete list.  Confirmed patient has transportation. Gave directions, instructed to utilize valet parking.

## 2023-08-19 ENCOUNTER — Encounter: Payer: Self-pay | Admitting: Cardiology

## 2023-08-19 ENCOUNTER — Ambulatory Visit: Attending: Cardiology | Admitting: Cardiology

## 2023-08-19 VITALS — BP 118/69 | HR 82 | Wt 211.4 lb

## 2023-08-19 DIAGNOSIS — I509 Heart failure, unspecified: Secondary | ICD-10-CM | POA: Insufficient documentation

## 2023-08-19 DIAGNOSIS — G479 Sleep disorder, unspecified: Secondary | ICD-10-CM | POA: Insufficient documentation

## 2023-08-19 DIAGNOSIS — I272 Pulmonary hypertension, unspecified: Secondary | ICD-10-CM

## 2023-08-19 DIAGNOSIS — I959 Hypotension, unspecified: Secondary | ICD-10-CM | POA: Insufficient documentation

## 2023-08-19 DIAGNOSIS — E785 Hyperlipidemia, unspecified: Secondary | ICD-10-CM | POA: Diagnosis not present

## 2023-08-19 DIAGNOSIS — Z9981 Dependence on supplemental oxygen: Secondary | ICD-10-CM | POA: Diagnosis not present

## 2023-08-19 DIAGNOSIS — J84112 Idiopathic pulmonary fibrosis: Secondary | ICD-10-CM | POA: Insufficient documentation

## 2023-08-19 DIAGNOSIS — I429 Cardiomyopathy, unspecified: Secondary | ICD-10-CM | POA: Diagnosis not present

## 2023-08-19 DIAGNOSIS — J449 Chronic obstructive pulmonary disease, unspecified: Secondary | ICD-10-CM | POA: Insufficient documentation

## 2023-08-19 DIAGNOSIS — E119 Type 2 diabetes mellitus without complications: Secondary | ICD-10-CM | POA: Insufficient documentation

## 2023-08-19 DIAGNOSIS — Z7984 Long term (current) use of oral hypoglycemic drugs: Secondary | ICD-10-CM | POA: Insufficient documentation

## 2023-08-19 DIAGNOSIS — Z87891 Personal history of nicotine dependence: Secondary | ICD-10-CM | POA: Diagnosis not present

## 2023-08-19 DIAGNOSIS — Z79899 Other long term (current) drug therapy: Secondary | ICD-10-CM | POA: Insufficient documentation

## 2023-08-19 NOTE — Progress Notes (Unsigned)
   ADVANCED HEART FAILURE NEW PATIENT CLINIC NOTE  Referring Physician: Tamea Dedra CROME, MD  Primary Care: Edman Marsa PARAS, DO Primary Cardiologist:  HPI: Paul Johnston is a 73 y.o. male with a PMH of IPF, COPD, former smoker who presents for initial visit for further evaluation and treatment of heart failure/cardiomyopathy.      Patient has a longstanding history of pulmonary fibrosis and COPD, has been following with pulmonary for some time.  He reports that he had been doing fairly well, until he was diagnosed with recent pneumonia and has since seen a precipitous drop in his functional status.  He was referred to advanced heart failure clinic given concerning findings for pulmonary hypertension on both echocardiogram and CT.     SUBJECTIVE:  Patient currently wearing oxygen, and accompanied by his son as well.  He reports that over the past few months he has had progressive worsening of his shortness of breath.  His oxygen saturations drop rapidly with exercise to the point where he has required oxygen.  He has difficulty carrying out his activities of daily living and is concerned about the recent change in his functional status.  He has no prior history of autoimmune disease, prior serologies for connective tissue disease positive for double-stranded DNA, otherwise negative.  He is awaiting a repeat sleep study.  He has been compliant with his inhaler therapy.  PMH, current medications, allergies, social history, and family history reviewed in epic.  PHYSICAL EXAM: Vitals:   08/19/23 1425  BP: 118/69  Pulse: 82  SpO2: 93%   GENERAL: Well nourished and in no apparent distress at rest.  PULM:  Normal work of breathing, clear to auscultation bilaterally. Respirations are unlabored.  CARDIAC:  JVP: Flat         Normal rate with regular rhythm. No murmurs, rubs or gallops.  Trace edema. Warm and well perfused extremities. ABDOMEN: Soft, non-tender,  non-distended. NEUROLOGIC: Patient is oriented x3 with no focal or lateralizing neurologic deficits.    DATA REVIEW  ECG: None since 2019  ECHO: 03/2023: LVEF 50 to 55%, mildly reduced RV systolic function with moderate enlargement, moderately elevated PA systolic pressure.  CATH: None   ASSESSMENT & PLAN:  Suspected pulmonary hypertension: Patient with known idiopathic pulmonary fibrosis and recent worsening of his functional status, on chronic home oxygen.  Echocardiogram concerning for developing pulmonary hypertension, suspect group 3. Discussed rationale for RHC and potential treatment avenues. - Prior autoimmune testing - RHC ordered - Awaiting sleep study for CPAP titration - Continue management of IPF as below - Further treatment and workup dependent on RHC, but suspect will need inhaled tyvaso - Hypotensive with losartan  treatment, stopped  IPF: Follows with pulmonary, last DLCO 41%.  - Continue Pirfenidone  801mg  TID - Inhaler therapy per pulmonary  HLD:  - Continue crestor  20mg  daily  DM2:  - Continue metformin  500mg  daily  Follow up in 2 months post cath  I have reviewed the risks, indications, and alternatives to cardiac catheterization +/- angioplasty or stenting with the patient. Risks include but are not limited to bleeding, infection, vascular injury, stroke, myocardial infection, arrhythmia, kidney injury, radiation-related injury in the case of prolonged fluoroscopy use, emergency cardiac surgery, and death. The patient understands the risks of serious complication is low (<1%) and he agrees to proceed.    [name], MD Advanced Heart Failure Mechanical Circulatory Support 08/20/23

## 2023-08-19 NOTE — Patient Instructions (Signed)
 Medication Changes:  No medication changes today!  Special Instructions // Education: [facility] Cardiac Cath Instructions  You are scheduled for a Cardiac Cath on:_________________________ Please arrive at _______am on the day of your procedure Please expect a call from our [facility] to pre-register you Do not eat/drink anything after midnight Someone will need to drive you home It is recommended someone be with you for the first 24 hours after your procedure Wear clothes that are easy to get on/off and wear slip on shoes if possible   Medications bring a current list of all medications with you  ___ You may take all of your medications the morning of your procedure with enough water to swallow safely  _X_ Do not take these medications before your procedure:__metformin_____________________________________________________________________________________________________________________________________________________________________________________________________________   Day of your procedure: Arrive at the [facility] entrance.  Free valet service is available.  After entering the [facility] please check-in at the registration desk (1st desk on your right) to receive your armband. After receiving your armband someone will escort you to the cardiac cath/special procedures waiting area.  The usual length of stay after your procedure is about 2 to 3 hours.  This can vary.  If you have any questions, please call our office at [phone], or you may call the cardiac cath lab at [facility] directly at [phone]      Follow-Up in: Please follow up with the Advanced [facility] Failure Clinic in 2 months with Dr. Zenaida.  At the [facility], you and your health needs are our priority. We have a designated team specialized in the treatment of Heart Failure. This Care Team includes your primary Heart Failure Specialized Cardiologist (physician), Advanced  Practice Providers (APPs- Physician Assistants and Nurse Practitioners), and Pharmacist who all work together to provide you with the care you need, when you need it.   You may see any of the following providers on your designated Care Team at your next follow up:  Dr. Toribio Fuel Dr. Ezra Shuck Dr. Ria Commander Dr. Odis Zenaida Ellouise Class, FNP Jaun Bash, RPH-CPP  Please be sure to bring in all your medications bottles to every appointment.   Need to Contact Us :  If you have any questions or concerns before your next appointment please send us  a message through [place] or call our office at [phone].    TO LEAVE A MESSAGE FOR THE NURSE SELECT OPTION 2, PLEASE LEAVE A MESSAGE INCLUDING: YOUR NAME DATE OF BIRTH CALL BACK NUMBER REASON FOR CALL**this is important as we prioritize the call backs  YOU WILL RECEIVE A CALL BACK THE SAME DAY AS LONG AS YOU CALL BEFORE 4:00 PM

## 2023-08-19 NOTE — H&P (View-Only) (Signed)
   ADVANCED HEART FAILURE NEW PATIENT CLINIC NOTE  Referring Physician: Tamea Dedra CROME, MD  Primary Care: Edman Marsa PARAS, DO Primary Cardiologist:  HPI: Paul Johnston is a 73 y.o. male with a PMH of IPF, COPD, former smoker who presents for initial visit for further evaluation and treatment of heart failure/cardiomyopathy.      Patient has a longstanding history of pulmonary fibrosis and COPD, has been following with pulmonary for some time.  He reports that he had been doing fairly well, until he was diagnosed with recent pneumonia and has since seen a precipitous drop in his functional status.  He was referred to advanced heart failure clinic given concerning findings for pulmonary hypertension on both echocardiogram and CT.     SUBJECTIVE:  Patient currently wearing oxygen, and accompanied by his son as well.  He reports that over the past few months he has had progressive worsening of his shortness of breath.  His oxygen saturations drop rapidly with exercise to the point where he has required oxygen.  He has difficulty carrying out his activities of daily living and is concerned about the recent change in his functional status.  He has no prior history of autoimmune disease, prior serologies for connective tissue disease positive for double-stranded DNA, otherwise negative.  He is awaiting a repeat sleep study.  He has been compliant with his inhaler therapy.  PMH, current medications, allergies, social history, and family history reviewed in epic.  PHYSICAL EXAM: Vitals:   08/19/23 1425  BP: 118/69  Pulse: 82  SpO2: 93%   GENERAL: Well nourished and in no apparent distress at rest.  PULM:  Normal work of breathing, clear to auscultation bilaterally. Respirations are unlabored.  CARDIAC:  JVP: Flat         Normal rate with regular rhythm. No murmurs, rubs or gallops.  Trace edema. Warm and well perfused extremities. ABDOMEN: Soft, non-tender,  non-distended. NEUROLOGIC: Patient is oriented x3 with no focal or lateralizing neurologic deficits.    DATA REVIEW  ECG: None since 2019  ECHO: 03/2023: LVEF 50 to 55%, mildly reduced RV systolic function with moderate enlargement, moderately elevated PA systolic pressure.  CATH: None   ASSESSMENT & PLAN:  Suspected pulmonary hypertension: Patient with known idiopathic pulmonary fibrosis and recent worsening of his functional status, on chronic home oxygen.  Echocardiogram concerning for developing pulmonary hypertension, suspect group 3. Discussed rationale for RHC and potential treatment avenues. - Prior autoimmune testing - RHC ordered - Awaiting sleep study for CPAP titration - Continue management of IPF as below - Further treatment and workup dependent on RHC, but suspect will need inhaled tyvaso - Hypotensive with losartan  treatment, stopped  IPF: Follows with pulmonary, last DLCO 41%.  - Continue Pirfenidone  801mg  TID - Inhaler therapy per pulmonary  HLD:  - Continue crestor  20mg  daily  DM2:  - Continue metformin  500mg  daily  Follow up in 2 months post cath  I have reviewed the risks, indications, and alternatives to cardiac catheterization +/- angioplasty or stenting with the patient. Risks include but are not limited to bleeding, infection, vascular injury, stroke, myocardial infection, arrhythmia, kidney injury, radiation-related injury in the case of prolonged fluoroscopy use, emergency cardiac surgery, and death. The patient understands the risks of serious complication is low (<1%) and he agrees to proceed.    [name], MD Advanced Heart Failure Mechanical Circulatory Support 08/20/23

## 2023-08-20 ENCOUNTER — Ambulatory Visit: Attending: Sleep Medicine

## 2023-08-20 DIAGNOSIS — Z6831 Body mass index (BMI) 31.0-31.9, adult: Secondary | ICD-10-CM | POA: Diagnosis not present

## 2023-08-20 DIAGNOSIS — J449 Chronic obstructive pulmonary disease, unspecified: Secondary | ICD-10-CM | POA: Diagnosis not present

## 2023-08-20 DIAGNOSIS — E119 Type 2 diabetes mellitus without complications: Secondary | ICD-10-CM | POA: Insufficient documentation

## 2023-08-20 DIAGNOSIS — R0683 Snoring: Secondary | ICD-10-CM | POA: Diagnosis not present

## 2023-08-20 DIAGNOSIS — G4733 Obstructive sleep apnea (adult) (pediatric): Secondary | ICD-10-CM | POA: Insufficient documentation

## 2023-08-20 DIAGNOSIS — E669 Obesity, unspecified: Secondary | ICD-10-CM | POA: Insufficient documentation

## 2023-08-20 DIAGNOSIS — Z9981 Dependence on supplemental oxygen: Secondary | ICD-10-CM | POA: Diagnosis not present

## 2023-08-20 DIAGNOSIS — J849 Interstitial pulmonary disease, unspecified: Secondary | ICD-10-CM | POA: Insufficient documentation

## 2023-08-20 DIAGNOSIS — G4710 Hypersomnia, unspecified: Secondary | ICD-10-CM | POA: Diagnosis not present

## 2023-08-20 DIAGNOSIS — I272 Pulmonary hypertension, unspecified: Secondary | ICD-10-CM | POA: Insufficient documentation

## 2023-08-25 ENCOUNTER — Telehealth (HOSPITAL_COMMUNITY): Payer: Self-pay

## 2023-08-25 ENCOUNTER — Other Ambulatory Visit: Payer: Self-pay

## 2023-08-25 DIAGNOSIS — I272 Pulmonary hypertension, unspecified: Secondary | ICD-10-CM

## 2023-08-25 DIAGNOSIS — G4733 Obstructive sleep apnea (adult) (pediatric): Secondary | ICD-10-CM

## 2023-08-25 NOTE — Progress Notes (Signed)
 Orders placed for right heart cath.   No precert required

## 2023-08-26 NOTE — Telephone Encounter (Signed)
 Will discuss further at follow-up visit.

## 2023-08-28 ENCOUNTER — Telehealth: Payer: Self-pay | Admitting: Pharmacist

## 2023-08-28 ENCOUNTER — Other Ambulatory Visit: Payer: Self-pay

## 2023-08-28 ENCOUNTER — Other Ambulatory Visit (HOSPITAL_COMMUNITY): Payer: Self-pay

## 2023-08-28 ENCOUNTER — Encounter: Payer: Self-pay | Admitting: Cardiology

## 2023-08-28 ENCOUNTER — Ambulatory Visit
Admission: RE | Admit: 2023-08-28 | Discharge: 2023-08-28 | Disposition: A | Discharge status: Home / Self Care | Attending: Cardiology | Admitting: Cardiology

## 2023-08-28 ENCOUNTER — Encounter
Admission: RE | Disposition: A | Discharge status: Home / Self Care | Payer: Self-pay | Source: Home / Self Care | Attending: Cardiology

## 2023-08-28 DIAGNOSIS — E785 Hyperlipidemia, unspecified: Secondary | ICD-10-CM | POA: Insufficient documentation

## 2023-08-28 DIAGNOSIS — I429 Cardiomyopathy, unspecified: Secondary | ICD-10-CM | POA: Insufficient documentation

## 2023-08-28 DIAGNOSIS — J449 Chronic obstructive pulmonary disease, unspecified: Secondary | ICD-10-CM | POA: Diagnosis not present

## 2023-08-28 DIAGNOSIS — J84112 Idiopathic pulmonary fibrosis: Secondary | ICD-10-CM | POA: Insufficient documentation

## 2023-08-28 DIAGNOSIS — E119 Type 2 diabetes mellitus without complications: Secondary | ICD-10-CM | POA: Insufficient documentation

## 2023-08-28 DIAGNOSIS — Z79899 Other long term (current) drug therapy: Secondary | ICD-10-CM | POA: Insufficient documentation

## 2023-08-28 DIAGNOSIS — I509 Heart failure, unspecified: Secondary | ICD-10-CM | POA: Diagnosis not present

## 2023-08-28 DIAGNOSIS — Z7984 Long term (current) use of oral hypoglycemic drugs: Secondary | ICD-10-CM | POA: Insufficient documentation

## 2023-08-28 DIAGNOSIS — Z87891 Personal history of nicotine dependence: Secondary | ICD-10-CM | POA: Insufficient documentation

## 2023-08-28 DIAGNOSIS — Z9981 Dependence on supplemental oxygen: Secondary | ICD-10-CM | POA: Diagnosis not present

## 2023-08-28 DIAGNOSIS — I272 Pulmonary hypertension, unspecified: Secondary | ICD-10-CM

## 2023-08-28 HISTORY — PX: RIGHT HEART CATH: CATH118263

## 2023-08-28 LAB — POCT I-STAT EG7
Acid-Base Excess: 1 mmol/L (ref 0.0–2.0)
Acid-Base Excess: 1 mmol/L (ref 0.0–2.0)
Bicarbonate: 25.9 mmol/L (ref 20.0–28.0)
Bicarbonate: 26.1 mmol/L (ref 20.0–28.0)
Calcium, Ion: 1.19 mmol/L (ref 1.15–1.40)
Calcium, Ion: 1.18 mmol/L (ref 1.15–1.40)
HCT: 45 % (ref 39.0–52.0)
HCT: 45 % (ref 39.0–52.0)
Hemoglobin: 15.3 g/dL (ref 13.0–17.0)
Hemoglobin: 15.3 g/dL (ref 13.0–17.0)
O2 Saturation: 54 %
O2 Saturation: 55 %
Potassium: 4.3 mmol/L (ref 3.5–5.1)
Potassium: 4.3 mmol/L (ref 3.5–5.1)
Sodium: 138 mmol/L (ref 135–145)
Sodium: 138 mmol/L (ref 135–145)
TCO2: 27 mmol/L (ref 22–32)
TCO2: 27 mmol/L (ref 22–32)
pCO2, Ven: 43.3 mmHg — ABNORMAL LOW (ref 44–60)
pCO2, Ven: 41.9 mmHg — ABNORMAL LOW (ref 44–60)
pH, Ven: 7.386 (ref 7.25–7.43)
pH, Ven: 7.403 (ref 7.25–7.43)
pO2, Ven: 29 mmHg — CL (ref 32–45)
pO2, Ven: 29 mmHg — CL (ref 32–45)

## 2023-08-28 LAB — BASIC METABOLIC PANEL WITH GFR
Anion gap: 10 (ref 5–15)
BUN: 19 mg/dL (ref 8–23)
CO2: 26 mmol/L (ref 22–32)
Calcium: 8.8 mg/dL — ABNORMAL LOW (ref 8.9–10.3)
Chloride: 102 mmol/L (ref 98–111)
Creatinine, Ser: 0.96 mg/dL (ref 0.61–1.24)
GFR, Estimated: >60 mL/min (ref >60)
Glucose, Bld: 111 mg/dL — ABNORMAL HIGH (ref 70–99)
Potassium: 4.3 mmol/L (ref 3.5–5.1)
Sodium: 138 mmol/L (ref 135–145)

## 2023-08-28 LAB — GLUCOSE, CAPILLARY: Glucose-Capillary: 122 mg/dL — ABNORMAL HIGH (ref 70–99)

## 2023-08-28 SURGERY — RIGHT HEART CATH
Anesthesia: Moderate Sedation | Laterality: Right

## 2023-08-28 MED ORDER — LIDOCAINE HCL 1 % IJ SOLN
INTRAMUSCULAR | Status: AC
  Filled 2023-08-28: qty 20

## 2023-08-28 MED ORDER — FENTANYL CITRATE (PF) 100 MCG/2ML IJ SOLN
INTRAMUSCULAR | Status: AC
  Filled 2023-08-28: qty 2

## 2023-08-28 MED ORDER — FENTANYL CITRATE (PF) 100 MCG/2ML IJ SOLN
INTRAMUSCULAR | Status: DC | PRN
  Administered 2023-08-28: 25 ug via INTRAVENOUS

## 2023-08-28 MED ORDER — ASPIRIN 81 MG PO CHEW: 81.0000 mg | CHEWABLE_TABLET | ORAL | Status: DC

## 2023-08-28 MED ORDER — HEPARIN (PORCINE) IN NACL 1000-0.9 UT/500ML-% IV SOLN
INTRAVENOUS | Status: DC | PRN
  Administered 2023-08-28: 500 mL

## 2023-08-28 MED ORDER — SODIUM CHLORIDE 0.9 % IV SOLN: INTRAVENOUS | Status: DC

## 2023-08-28 MED ORDER — LIDOCAINE HCL (PF) 1 % IJ SOLN
INTRAMUSCULAR | Status: DC | PRN
  Administered 2023-08-28: 5 mL

## 2023-08-28 SURGICAL SUPPLY — 7 items
CATH SWAN GANZ 7F STRAIGHT (CATHETERS) IMPLANT
DRAPE BRACHIAL (DRAPES) IMPLANT
GLIDESHEATH SLENDER 7FR .021G (SHEATH) IMPLANT
GUIDEWIRE .025 260CM (WIRE) IMPLANT
PACK CARDIAC CATH (CUSTOM PROCEDURE TRAY) ×1 IMPLANT
SET ATX-X65L (MISCELLANEOUS) IMPLANT
STATION PROTECTION PRESSURIZED (MISCELLANEOUS) IMPLANT

## 2023-08-28 NOTE — Interval H&P Note (Signed)
 History and Physical Interval Note:  08/28/2023 9:58 AM  Paul Johnston  has presented today for surgery, with the diagnosis of R Cath   Pulmonary hypersion.  The various methods of treatment have been discussed with the patient and family. After consideration of risks, benefits and other options for treatment, the patient has consented to  Procedure(s): RIGHT HEART CATH (Right) as a surgical intervention.  The patient's history has been reviewed, patient examined, no change in status, stable for surgery.  I have reviewed the patient's chart and labs.  Questions were answered to the patient's satisfaction.     [name]

## 2023-08-28 NOTE — Telephone Encounter (Signed)
 Prior authorization initiated for Tyvaso DPI Titration Kit. (KeyBETHA HORNS) - [id number].

## 2023-08-29 ENCOUNTER — Telehealth (HOSPITAL_COMMUNITY): Payer: Self-pay | Admitting: Pharmacy Technician

## 2023-08-29 ENCOUNTER — Other Ambulatory Visit (HOSPITAL_COMMUNITY): Payer: Self-pay

## 2023-08-29 NOTE — Telephone Encounter (Addendum)
 [employer] Grant Approved   Card No. [id number] BIN [id number] PCN [id number] Group [id number]

## 2023-08-29 NOTE — Telephone Encounter (Signed)
 Advanced Heart Failure Patient Product/process development scientist completed.   The patient is insured through [facility] ADVANTAGE/RX ADVANCE   Ran test claim for Tyvaso. Currently a quantity of 112 is a 28 day supply and the co-pay is $99 (after one fill, co-pay should drop to $0 for the remainder of the year). Suggested obtaining [employer] grant if one time co-pay is unaffordable.   This test claim was processed through [facility]- copay amounts may vary at other pharmacies due to pharmacy/plan contracts, or as the patient moves through the different stages of their insurance plan.   Almarie JULIANNA Pa, CPhT

## 2023-08-29 NOTE — Telephone Encounter (Signed)
 Prior authorization approved. Copay check pending. Patient called and notified. He will present to clinic for education on Monday.

## 2023-09-01 ENCOUNTER — Encounter: Payer: Self-pay | Admitting: Pulmonary Disease

## 2023-09-01 ENCOUNTER — Ambulatory Visit: Attending: Cardiology | Admitting: Pharmacist

## 2023-09-01 ENCOUNTER — Ambulatory Visit: Admitting: Pulmonary Disease

## 2023-09-01 ENCOUNTER — Other Ambulatory Visit (HOSPITAL_COMMUNITY): Payer: Self-pay

## 2023-09-01 VITALS — BP 122/72 | HR 106 | Ht 69.0 in | Wt 211.0 lb

## 2023-09-01 DIAGNOSIS — J849 Interstitial pulmonary disease, unspecified: Secondary | ICD-10-CM | POA: Insufficient documentation

## 2023-09-01 DIAGNOSIS — Z87891 Personal history of nicotine dependence: Secondary | ICD-10-CM | POA: Diagnosis not present

## 2023-09-01 DIAGNOSIS — I272 Pulmonary hypertension, unspecified: Secondary | ICD-10-CM | POA: Diagnosis not present

## 2023-09-01 DIAGNOSIS — I429 Cardiomyopathy, unspecified: Secondary | ICD-10-CM | POA: Diagnosis not present

## 2023-09-01 DIAGNOSIS — J449 Chronic obstructive pulmonary disease, unspecified: Secondary | ICD-10-CM

## 2023-09-01 DIAGNOSIS — J9611 Chronic respiratory failure with hypoxia: Secondary | ICD-10-CM | POA: Diagnosis not present

## 2023-09-01 DIAGNOSIS — G4733 Obstructive sleep apnea (adult) (pediatric): Secondary | ICD-10-CM

## 2023-09-01 DIAGNOSIS — Z7984 Long term (current) use of oral hypoglycemic drugs: Secondary | ICD-10-CM | POA: Diagnosis not present

## 2023-09-01 DIAGNOSIS — E119 Type 2 diabetes mellitus without complications: Secondary | ICD-10-CM | POA: Insufficient documentation

## 2023-09-01 DIAGNOSIS — E785 Hyperlipidemia, unspecified: Secondary | ICD-10-CM | POA: Insufficient documentation

## 2023-09-01 DIAGNOSIS — Z79899 Other long term (current) drug therapy: Secondary | ICD-10-CM | POA: Diagnosis not present

## 2023-09-01 DIAGNOSIS — Z9981 Dependence on supplemental oxygen: Secondary | ICD-10-CM | POA: Insufficient documentation

## 2023-09-01 MED ORDER — TYVASO DPI INSTITUTIONAL KIT 16 MCG IN POWD: 16.0000 ug | Freq: Four times a day (QID) | RESPIRATORY_TRACT | Status: DC

## 2023-09-01 NOTE — Progress Notes (Signed)
 Advanced Heart Failure Clinic Note  PCP: Edman Marsa PARAS, DO PCP-Cardiologist: None HF-Cardiologist: [name], MD  HPI:  Paul Johnston is a 73 y.o. male with a PMH of IPF, COPD, former smoker initially seen by Dr. Brownie for further evaluation and treatment of heart failure/cardiomyopathy. Patient has a longstanding history of pulmonary fibrosis and COPD, has been following with pulmonary for some time. He was referred to advanced heart failure clinic given concerning findings for pulmonary hypertension on both echocardiogram and CT. Echo 03/2023 showed LVEF of 50-55% with grade I diastolic dysfunction and mildly reduced RV function.  Patient underwent RHC by Dr. Gardenia on 08/28/23, showing RA of 14 mmHg (mean), 92/45 mmHg (61 mean), PCWP 14 mmHg (mean), Estimated Fick CO/CI 3.3 L/min, 1.56 L/min/m2, Thermodilution CO/CI 3.5 L/min, 1.67 L/min/m2, TPG 47 mmHg, PVR 13.4-14 Wood Units, and PAPi 3.35. 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  Patient presents today for Tyvaso  DPI education. This was an education visit only and symptoms were not assessed.  Current Heart Failure Medications: Loop diuretic: none Beta-Blocker: none ACEI/ARB/ARNI: none MRA: none SGLT2i: none Other: none  Has the patient been experiencing any side effects to the medications prescribed? No  Does the patient have any problems obtaining medications due to transportation or finances? No  Understanding of regimen: Good  Understanding of indications: Good  Potential of adherence: Good  Patient understands to avoid NSAIDs.  Patient understands to avoid decongestants.  Pertinent Lab Values: Creat  Date Value Ref Range Status  06/13/2023 1.00 0.70 - 1.28 mg/dL Final   Creatinine, Ser  Date Value Ref Range Status  08/28/2023 0.96 0.61 - 1.24 mg/dL Final   BUN  Date Value Ref Range Status  08/28/2023 19 8 - 23 mg/dL Final  94/98/7980 18 8 - 27 mg/dL Final   Potassium   Date Value Ref Range Status  08/28/2023 4.3 3.5 - 5.1 mmol/L Final  08/28/2023 4.3 3.5 - 5.1 mmol/L Final   Sodium  Date Value Ref Range Status  08/28/2023 138 135 - 145 mmol/L Final  08/28/2023 138 135 - 145 mmol/L Final  06/11/2017 139 134 - 144 mmol/L Final   Brain Natriuretic Peptide  Date Value Ref Range Status  03/25/2022 36 <100 pg/mL Final    Comment:    . BNP levels increase with age in the general population with the highest values seen in individuals greater than 32 years of age. Reference: J. Am. Penne. Cardiol. 2002; 59:023-017. SABRA    TSH  Date Value Ref Range Status  12/05/2022 3.93 0.40 - 4.50 mIU/L Final    Vital Signs: There were no vitals filed for this visit.  Assessment/Plan: Pulmonary hypertension: Patient with history of idiopathic pulmonary fibrosis and recent worsening of his functional status, on chronic home oxygen. RHC 08/2023 with PAP 92/45 mmHg (61 mean), PCWP 14 mmHg (mean), PVR 13.4-14 Wood Units, and PAPi 3.35.  - Initiate Tyvaso  DPI. Titration to 64 mcg/dose outpatient per specialty nurse. Patient and son were educated today on Tyvaso  DPI use and expectations. Patient was also given information for his approved grant. - Prior autoimmune testing  - Awaiting sleep study for CPAP titration - Continue management of IPF as below  - Hypotensive with losartan  treatment, stopped   IPF: Follows with pulmonary, last DLCO 41%.  - Continue Pirfenidone  801mg  TID - Inhaler therapy per pulmonary   HLD:  - Continue crestor  20mg  daily   DM2:  - Continue metformin  500mg  daily  Follow up:  10/17/23 with Dr. Zenaida  Please do not hesitate to reach out with questions or concerns,  Jaun Bash, PharmD, CPP, BCPS, [facility] Heart Failure Pharmacist  Phone - [phone] 09/01/2023 3:03 PM

## 2023-09-01 NOTE — Patient Instructions (Signed)
 VISIT SUMMARY:  You came in today for a follow-up visit to discuss your pulmonary hypertension, chronic respiratory failure with hypoxia, pulmonary fibrosis, COPD, and obstructive sleep apnea. We reviewed your current treatments and made some adjustments to help manage your conditions better.  YOUR PLAN:  -PULMONARY HYPERTENSION: Pulmonary hypertension is high blood pressure in the arteries that go from your heart to your lungs. You should continue using oxygen therapy at three liters to keep your oxygen levels at 92% or better. We have also started you on Tyvaso  to help reduce the pressure in your pulmonary arteries.  -CHRONIC RESPIRATORY FAILURE WITH HYPOXIA: Chronic respiratory failure with hypoxia means your lungs are not getting enough oxygen into your blood. You should continue using oxygen therapy at three liters to keep your oxygen levels at 92% or better. Please monitor your oxygen levels at home with an oximeter.  -PULMONARY FIBROSIS: Pulmonary fibrosis is a condition where your lung tissue becomes damaged and scarred, making it hard to breathe. Continue with your current management strategies, including oxygen therapy and medications.  -CHRONIC OBSTRUCTIVE PULMONARY DISEASE (COPD): COPD is a chronic inflammatory lung disease that causes obstructed airflow from the lungs. You should continue using your Trelegy inhaler daily.  -OBSTRUCTIVE SLEEP APNEA, MODERATE: Obstructive sleep apnea is a condition where your breathing stops and starts during sleep. We recommend using a CPAP machine with a nasal mask and oxygen bleed-in to help reduce the pressure in your pulmonary arteries and improve your oxygen levels during sleep.  INSTRUCTIONS:  Please continue with your current treatments and follow the new recommendations. Use your oxygen therapy at three liters to maintain your oxygen levels at 92% or better, and monitor your oxygen levels at home with an oximeter. Start using the Tyvaso   medication as prescribed. We will order a CPAP machine with a nasal mask and oxygen bleed-in for your obstructive sleep apnea. Follow up with us  if you have any concerns or if your symptoms worsen.

## 2023-09-01 NOTE — Progress Notes (Unsigned)
 Subjective:    Patient ID: Paul Johnston, male    DOB: 27-Mar-1950, 73 y.o.   MRN: [id number]  Patient Care Team: Edman Marsa PARAS, DO as PCP - General (Family Medicine) Mevelyn JONETTA Bathe, OD [facility])  Chief Complaint  Patient presents with  . ILD    BACKGROUND/INTERVAL:  HPI    Review of Systems A 10 point review of systems was performed and it is as noted above otherwise negative.   Patient Active Problem List   Diagnosis Date Noted  . Interstitial lung disease (HCC) 08/13/2022  . Diarrhea 03/08/2022  . Gastroesophageal reflux disease 03/08/2022  . Pulmonary fibrosis (HCC) 11/30/2021  . Tinnitus, bilateral 01/27/2019  . Non-melanoma skin cancer 09/15/2018  . History of sleep apnea 09/15/2016  . Type 2 diabetes mellitus with other specified complication (HCC)   . Obesity (BMI 30.0-34.9) 12/09/2013  . GERD (gastroesophageal reflux disease) 09/01/2012  . Vitamin D  deficiency 09/01/2012  . Solitary pulmonary nodule, history of, left side 09/01/2012  . BPH (benign prostatic hyperplasia) 09/01/2012  . Umbilical hernia 07/03/2012  . Hyperlipidemia associated with type 2 diabetes mellitus (HCC) 12/14/2008  . Tobacco use disorder in remission 12/14/2008    Social History   Tobacco Use  . Smoking status: Former    Current packs/day: 0.00    Average packs/day: 1 pack/day for 33.0 years (33.0 ttl pk-yrs)    Types: Cigarettes    Start date: 09/07/1975    Quit date: 09/06/2008    Years since quitting: 14.9    Passive exposure: Past  . Smokeless tobacco: Former  Substance Use Topics  . Alcohol use: No    Allergies  Allergen Reactions  . Biaxin [Clarithromycin] Other (See Comments)    GI upset    Current Meds  Medication Sig  . albuterol  (VENTOLIN  HFA) 108 (90 Base) MCG/ACT inhaler Inhale 2 puffs into the lungs every 6 (six) hours as needed for wheezing or shortness of breath.  . Cholecalciferol (VITAMIN D3) 50 MCG (2000 UT) TABS Take 2,000 Units by mouth  daily.  . ESBRIET  801 MG TABS Take 1 tablet (801 mg total) by mouth 3 (three) times daily with meals.  . Fluticasone -Umeclidin-Vilant (TRELEGY ELLIPTA ) 100-62.5-25 MCG/ACT AEPB Inhale 1 puff into the lungs daily.  . Lancets (ONETOUCH ULTRASOFT) lancets Use as instructed  . metFORMIN  (GLUCOPHAGE -XR) 500 MG 24 hr tablet Take 1 tablet (500 mg total) by mouth daily with supper.  . omeprazole  (PRILOSEC) 40 MG capsule Take 1 capsule (40 mg total) by mouth daily before breakfast.  . ONETOUCH VERIO test strip Check blood sugar up to 2 times daily as instructed  . rosuvastatin  (CRESTOR ) 20 MG tablet Take 1 tablet (20 mg total) by mouth daily.  . Treprostinil  (TYVASO  DPI INSTITUTIONAL KIT) 16 MCG POWD Inhale 16 mcg into the lungs in the morning, at noon, in the evening, and at bedtime.    Immunization History  Administered Date(s) Administered  .  sv, Bivalent, Protein Subunit Rsvpref,pf Marlow) 11/26/2021  . Fluad Quad(high Dose 65+) 12/08/2018, 12/09/2019, 11/28/2020, 11/30/2021  . Influenza, High Dose Seasonal PF 12/12/2016, 12/17/2017, 11/26/2022  . Influenza,inj,Quad PF,6+ Mos 11/18/2012, 12/21/2015  . PFIZER Comirnaty(Gray Top)Covid-19 Tri-Sucrose Vaccine 06/29/2020  . PFIZER(Purple Top)SARS-COV-2 Vaccination 03/20/2019, 04/10/2019, 11/18/2019  . PNEUMOCOCCAL CONJUGATE-20 06/23/2023  . Pfizer Covid-19 Vaccine Bivalent Booster 48yrs & up 11/17/2020  . Pfizer Covid-19 Vaccine Bivalent Booster 5y-11y 11/26/2021  . Pfizer(Comirnaty)Fall Seasonal Vaccine 12 years and older 11/26/2021, 11/26/2022  . Pneumococcal Conjugate-13 12/02/2012  . Pneumococcal Polysaccharide-23 06/11/2017  .  Respiratory Syncytial Virus Vaccine,Recomb Aduvanted(Arexvy) 11/26/2021  . Td 11/20/2004, 12/21/2010  . Tdap 12/21/2010, 11/26/2022        Objective:     BP 122/72 (BP Location: Left Arm, Patient Position: Sitting, Cuff Size: Normal)   Pulse (!) 106   Ht 5' 9 (1.753 m)   Wt 211 lb (95.7 kg)   SpO2 (!) 82%  Comment: 1L CONT  BMI 31.16 kg/m   SpO2: (!) 82 % (1L CONT)  96% on 3 LPM  GENERAL: Obese gentleman, no acute distress, fully ambulatory, no conversational dyspnea. HEAD: Normocephalic, atraumatic.  EYES: Pupils equal, round, reactive to light.  No scleral icterus.  MOUTH: Dentition intact, Mallampati III airway, no thrush. NECK: Supple. No thyromegaly. Trachea midline. No JVD.  No adenopathy. PULMONARY: Good air entry bilaterally.  Faint crackles at bases, no other adventitious sounds.SABRA CARDIOVASCULAR: S1 and S2. Regular rate and rhythm.  Cards normal ABDOMEN: Obese, otherwise benign. MUSCULOSKELETAL: No joint deformity, no clubbing, no edema.  NEUROLOGIC: No overt focal deficit, no gait disturbance, speech is fluent. SKIN: Intact,warm,dry. PSYCH: Anxious mood.       Assessment & Plan:     ICD-10-CM   1. ILD (interstitial lung disease) (HCC)  J84.9     2. COPD mixed type (HCC)  J44.9     3. Chronic respiratory failure with hypoxia (HCC)  J96.11     4. Pulmonary hypertension (HCC)  I27.20     5. OSA (obstructive sleep apnea)  G47.33       No orders of the defined types were placed in this encounter.   No orders of the defined types were placed in this encounter.      Advised if symptoms do not improve or worsen, to please contact office for sooner follow up or seek emergency care.    I spent xxx minutes of dedicated to the care of this patient on the date of this encounter to include pre-visit review of records, face-to-face time with the patient discussing conditions above, post visit ordering of testing, clinical documentation with the electronic health record, making appropriate referrals as documented, and communicating necessary findings to members of the patients care team.     C. Leita Sanders, MD Advanced Bronchoscopy PCCM [facility]    *This note was generated using voice recognition software/Dragon and/or AI transcription  program.  Despite best efforts to proofread, errors can occur which can change the meaning. Any transcriptional errors that result from this process are unintentional and may not be fully corrected at the time of dictation.

## 2023-09-02 ENCOUNTER — Encounter: Payer: Self-pay | Admitting: Pulmonary Disease

## 2023-09-04 ENCOUNTER — Telehealth: Payer: Self-pay | Admitting: Pharmacist

## 2023-09-04 NOTE — Telephone Encounter (Signed)
 Spoke with [facility]. They confirmed prescriptions and grant information has been received and will be shipped to the patient shortly.

## 2023-09-05 DIAGNOSIS — J449 Chronic obstructive pulmonary disease, unspecified: Secondary | ICD-10-CM | POA: Diagnosis not present

## 2023-09-16 ENCOUNTER — Other Ambulatory Visit: Payer: Self-pay | Admitting: Pulmonary Disease

## 2023-09-16 DIAGNOSIS — J849 Interstitial pulmonary disease, unspecified: Secondary | ICD-10-CM

## 2023-09-16 NOTE — Telephone Encounter (Signed)
 Copied from CRM #[id number]. Topic: Clinical - Medication Refill >> Sep 16, 2023  4:52 PM Rilla B wrote: Medication: ESBRIET  801 MG TABS  Has the patient contacted their pharmacy? No (Agent: If no, request that the patient contact the pharmacy for the refill. If patient does not wish to contact the pharmacy document the reason why and proceed with request.) (Agent: If yes, when and what did the pharmacy advise?)  This is the patient's preferred pharmacy:   [facility] - [place], [place] - [address] [place] [address] Phone: [phone] Fax: [phone]  Is this the correct pharmacy for this prescription? Yes If no, delete pharmacy and type the correct one.   Has the prescription been filled recently? Yes  Is the patient out of the medication? No  Has the patient been seen for an appointment in the last year OR does the patient have an upcoming appointment? Yes  Can we respond through MyChart? Yes  Agent: Please be advised that Rx refills may take up to 3 business days. We ask that you follow-up with your pharmacy.

## 2023-09-17 MED ORDER — ESBRIET 801 MG PO TABS: 801.0000 mg | ORAL_TABLET | Freq: Three times a day (TID) | ORAL | 1 refills | Status: DC

## 2023-09-17 NOTE — Telephone Encounter (Unsigned)
 Copied from CRM #[id number]. Topic: General - Other >> Sep 16, 2023  4:56 PM Rilla B wrote: Reason for CRM: Patient states he moved from Dr Shelah to Dr Tamea. Patient states Dr Shelah use to sign for his Esbriet , however, Dr. Lenda is now his doctor because she is closer to his home.  Patient would like to know when the script is sent over to the pharmacy so he can call and get a delivery date (states its an expensive med) and he has to stay on top of it.SABRA

## 2023-10-06 DIAGNOSIS — J449 Chronic obstructive pulmonary disease, unspecified: Secondary | ICD-10-CM | POA: Diagnosis not present

## 2023-10-06 DIAGNOSIS — G4733 Obstructive sleep apnea (adult) (pediatric): Secondary | ICD-10-CM | POA: Diagnosis not present

## 2023-10-16 ENCOUNTER — Ambulatory Visit: Admitting: Pulmonary Disease

## 2023-10-16 ENCOUNTER — Telehealth: Payer: Self-pay | Admitting: Cardiology

## 2023-10-16 NOTE — Telephone Encounter (Signed)
 Called to confirm/remind patient of their appointment at the [facility] on 10/17/23.   Appointment:   [x] Confirmed  [] Left mess   [] No answer/No voice mail  [] VM Full/unable to leave message  [] Phone not in service  Patient reminded to bring all medications and/or complete list.  Confirmed patient has transportation. Gave directions, instructed to utilize valet parking.

## 2023-10-17 ENCOUNTER — Ambulatory Visit: Attending: Cardiology | Admitting: Cardiology

## 2023-10-17 ENCOUNTER — Encounter: Payer: Self-pay | Admitting: Cardiology

## 2023-10-17 VITALS — BP 116/92 | HR 78 | Wt 210.0 lb

## 2023-10-17 DIAGNOSIS — J449 Chronic obstructive pulmonary disease, unspecified: Secondary | ICD-10-CM | POA: Insufficient documentation

## 2023-10-17 DIAGNOSIS — E119 Type 2 diabetes mellitus without complications: Secondary | ICD-10-CM | POA: Diagnosis not present

## 2023-10-17 DIAGNOSIS — J84112 Idiopathic pulmonary fibrosis: Secondary | ICD-10-CM | POA: Diagnosis not present

## 2023-10-17 DIAGNOSIS — Z79899 Other long term (current) drug therapy: Secondary | ICD-10-CM | POA: Diagnosis not present

## 2023-10-17 DIAGNOSIS — Z9981 Dependence on supplemental oxygen: Secondary | ICD-10-CM | POA: Diagnosis not present

## 2023-10-17 DIAGNOSIS — I272 Pulmonary hypertension, unspecified: Secondary | ICD-10-CM | POA: Insufficient documentation

## 2023-10-17 DIAGNOSIS — Z7984 Long term (current) use of oral hypoglycemic drugs: Secondary | ICD-10-CM | POA: Diagnosis not present

## 2023-10-17 DIAGNOSIS — R059 Cough, unspecified: Secondary | ICD-10-CM | POA: Diagnosis not present

## 2023-10-17 DIAGNOSIS — E785 Hyperlipidemia, unspecified: Secondary | ICD-10-CM | POA: Diagnosis not present

## 2023-10-17 NOTE — Progress Notes (Signed)
   ADVANCED HEART FAILURE FOLLOW UP CLINIC NOTE  Referring Physician: Edman Blunt *  Primary Care: Edman Blunt PARAS, DO Primary Cardiologist:  HPI: Paul Johnston is a 73 y.o. male who presents for follow up of pulmonary hypertension.         Patient has a longstanding history of pulmonary fibrosis and COPD, has been following with pulmonary for some time.  He reports that he had been doing fairly well, until he was diagnosed with recent pneumonia and has since seen a precipitous drop in his functional status.  He was referred to advanced heart failure clinic given concerning findings for pulmonary hypertension on both echocardiogram and CT.  Underwent right heart catheterization showing severely elevated pulmonary arterial pressures, severely reduced cardiac output.     SUBJECTIVE:  Patient continues to feel extremely poorly.  He has had a lot of coughing issues with the Tyvaso , though he initially felt better on the medication.  He reports that he feels short of breath doing even minimal activities such as taking a shower.  We discussed pulmonary rehab, but he does not feel that he can move around enough to engage in those exercises.  We did discuss overall prognosis, which I noted is likely extremely poor given his advanced pulmonary disease as well as pulmonary hypertension.  We will attempt to modify his cardiac output is much as we can but there are limited therapies for his already advanced lung disease.  Do not believe that he is quite ready for hospice, but if he has significant worsening he may benefit from palliative care referral.  PMH, current medications, allergies, social history, and family history reviewed in epic.  PHYSICAL EXAM: Vitals:   10/17/23 1436 10/17/23 1438  BP: (!) 116/92 (!) 116/92  Pulse: 66 78  SpO2:  90%   GENERAL: NAD, chronically ill appearing PULM: Bilateral rhonchi and crackles CARDIAC:  JVP: flat         Normal rate with  regular rhythm.  Prominent and split P2.  Trace edema. Warm and well perfused extremities. ABDOMEN: Soft, non-tender, non-distended. NEUROLOGIC: Patient is oriented x3 with no focal or lateralizing neurologic deficits.     DATA REVIEW  ECG: None since 2019   ECHO: 03/2023: LVEF 50 to 55%, mildly reduced RV systolic function with moderate enlargement, moderately elevated PA systolic pressure.   CATH: 08/2023: RA 14, PA 92/45 (61), PCWP 14, TD CO/CI 3.5/1.67, PVR 13.4 wood units  ASSESSMENT & PLAN:  pulmonary hypertension: Patient with known idiopathic pulmonary fibrosis and recent worsening of his functional status, on chronic home oxygen .  WHO FC IV, RHC concerning with elevated RA pressures, severely reduced CI. Primarily group III.  -Transition inhaled to nebulized Tyvaso  given coughing issue -Would titrate to maximum and then can consider starting low-dose PDE 5 given severely elevated PA pressures and advanced symptoms -Would benefit from pulmonary rehab when able - Starting to wear his CPAP, discussed the importance of this - Continue management of IPF as below - No blood pressure medications   IPF: Follows with pulmonary, last DLCO 41%.  - Continue Pirfenidone  801mg  TID - Inhaler therapy per pulmonary   HLD:  - Continue crestor  20mg  daily   DM2:  - Continue metformin  500mg  daily, consider Jardiance at next visit given elevated RA pressures  Follow up in 2 months  [name], MD Advanced Heart Failure Mechanical Circulatory Support 10/18/23

## 2023-10-17 NOTE — Patient Instructions (Signed)
 Medication Changes:  SWITCHED Tyvaso  to nebulized formulation.   Follow-Up in: Please follow up with the [facility] Clinic in 2 months with Dr. Zenaida. We do not have that schedule. Please call us  in October in order to schedule your appointment for November.    Thank you for choosing [facility].    At the [facility] Clinic, you and your health needs are our priority. We have a designated team specialized in the treatment of Heart Failure. This Care Team includes your primary Heart Failure Specialized Cardiologist (physician), Advanced Practice Providers (APPs- Physician Assistants and Nurse Practitioners), and Pharmacist who all work together to provide you with the care you need, when you need it.   You may see any of the following providers on your designated Care Team at your next follow up:  Dr. Toribio Fuel Dr. Ezra Shuck Dr. Ria Commander Dr. Morene Zenaida Ellouise Class, FNP Jaun Bash, RPH-CPP  Please be sure to bring in all your medications bottles to every appointment.   Need to Contact Us :  If you have any questions or concerns before your next appointment please send us  a message through [name] or call our office at [phone].    TO LEAVE A MESSAGE FOR THE NURSE SELECT OPTION 2, PLEASE LEAVE A MESSAGE INCLUDING: YOUR NAME DATE OF BIRTH CALL BACK NUMBER REASON FOR CALL**this is important as we prioritize the call backs  YOU WILL RECEIVE A CALL BACK THE SAME DAY AS LONG AS YOU CALL BEFORE 4:00 PM

## 2023-11-20 ENCOUNTER — Telehealth (INDEPENDENT_AMBULATORY_CARE_PROVIDER_SITE_OTHER): Payer: Self-pay | Admitting: Sleep Medicine

## 2023-11-20 NOTE — Telephone Encounter (Signed)
 Mild to moderate OSA.

## 2023-11-20 NOTE — Telephone Encounter (Signed)
 Noted, NFN, pt is already on CPAP.

## 2023-11-25 ENCOUNTER — Encounter: Payer: Self-pay | Admitting: Pulmonary Disease

## 2023-11-27 ENCOUNTER — Encounter: Payer: Self-pay | Admitting: Pulmonary Disease

## 2023-11-27 ENCOUNTER — Ambulatory Visit: Admitting: Pulmonary Disease

## 2023-11-27 VITALS — BP 116/66 | HR 78 | Temp 97.7°F | Ht 69.0 in | Wt 215.4 lb

## 2023-11-27 DIAGNOSIS — G4733 Obstructive sleep apnea (adult) (pediatric): Secondary | ICD-10-CM

## 2023-11-27 DIAGNOSIS — J449 Chronic obstructive pulmonary disease, unspecified: Secondary | ICD-10-CM | POA: Diagnosis not present

## 2023-11-27 DIAGNOSIS — Z23 Encounter for immunization: Secondary | ICD-10-CM | POA: Diagnosis not present

## 2023-11-27 DIAGNOSIS — I272 Pulmonary hypertension, unspecified: Secondary | ICD-10-CM | POA: Diagnosis not present

## 2023-11-27 DIAGNOSIS — J9611 Chronic respiratory failure with hypoxia: Secondary | ICD-10-CM | POA: Diagnosis not present

## 2023-11-27 DIAGNOSIS — J8410 Pulmonary fibrosis, unspecified: Secondary | ICD-10-CM

## 2023-11-27 DIAGNOSIS — J439 Emphysema, unspecified: Secondary | ICD-10-CM

## 2023-11-27 NOTE — Progress Notes (Signed)
 Subjective:    Patient ID: Paul Johnston, male    DOB: 1951/01/19, 73 y.o.   MRN: [id number]  Patient Care Team: Edman Marsa PARAS, DO as PCP - General (Family Medicine) Zenaida Morene PARAS, MD as PCP - Advanced Heart Failure (Cardiology) Mevelyn JONETTA Bathe, OD [facility])  Chief Complaint  Patient presents with   Interstitial Lung Disease    Chest congestion, cough with brown phlegm. Shortness of breath.     BACKGROUND/INTERVAL:Patient is a very complex 73 year old former smoker with a history of pulmonary fibrosis previously followed by Dr. Praveen Mannam at our [facility] location.  Patient transferred care to our [facility] location first visit with me was on 04 June 2023.  At that time the patient was noted to have severe issues with hypoxemia.  He was started on oxygen  supplementation.  His last visit with me was on 10 Jul 2023.   HPI Discussed the use of AI scribe software for clinical note transcription with the patient, who gave verbal consent to proceed.  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  He experiences persistent breathing difficulties despite previous interventions, including a heart catheterization and medication adjustments.  He was given a trial of Tyvaso  DPI however, inhaler causes coughing and feels ineffective, and he has not yet received the prescribed nebulizer due to communication issues with the pharmacy.  He uses a CPAP machine for sleep apnea but finds it uncomfortable and reports that it disrupts his sleep, causing him to feel more rested without it. He experiences leg cramps at night, which require him to get up and move, further complicating his use of the CPAP.  He is currently on oxygen  therapy, which was initiated following a pneumonia episode in February that exacerbated his  respiratory conditions.   He is frustrated because he states that he cannot get relief of his symptoms however he does not seem to tolerate or approve of any of the interventions recommended.  He is currently on Esbriet .    DATA PFTs: 09/11/2021 FVC 2.03 [70%], FEV1 2.41 [76%], F/F 79, TLC 5.68 [83%], DLCO 13.43 [53%] Moderate diffusion defect 08/13/2022 FVC 2.87 [61%], FEV1 2.33 94%, F/F81, TLC 5.49 80%, DLCO 10.35 41% Moderate diffusion defect. 07/10/2023 FEV1 2.25 L or 72% predicted, FVC 2.68 L or 63% predicted, FEV1/FVC 84% moderate reduction in lung volumes diffusion capacity severely reduced.   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   Labs: CTD serologies 07/05/2021-positive only for double-stranded DNA Hepatic panel 03/25/2022-within normal limits Alpha 1 antitrypsin 06/13/2023 -phenotype MM level 203 mg/dL (normal)  Review of Systems A 10 point review of systems was performed and it is as noted above otherwise negative.   Patient Active  Problem List   Diagnosis Date Noted   Interstitial lung disease (HCC) 08/13/2022   Diarrhea 03/08/2022   Gastroesophageal reflux disease 03/08/2022   Pulmonary fibrosis (HCC) 11/30/2021   Tinnitus, bilateral 01/27/2019   Non-melanoma skin cancer 09/15/2018   History of sleep apnea 09/15/2016   Type 2 diabetes mellitus with other specified complication (HCC)    Obesity (BMI 30.0-34.9) 12/09/2013   GERD (gastroesophageal reflux disease) 09/01/2012   Vitamin D  deficiency 09/01/2012   Solitary pulmonary nodule, history of, left side 09/01/2012   BPH (benign prostatic hyperplasia) 09/01/2012   Umbilical hernia 07/03/2012   Hyperlipidemia associated with type 2 diabetes mellitus (HCC) 12/14/2008   Tobacco use disorder in remission 12/14/2008    Social History   Tobacco Use   Smoking status: Former    Current packs/day: 0.00    Average packs/day: 1 pack/day for 33.0 years (33.0 ttl pk-yrs)    Types: Cigarettes    Start date: 09/07/1975    Quit date: 09/06/2008    Years since quitting: 15.2    Passive exposure: Past   Smokeless tobacco: Former  Substance Use Topics   Alcohol use: No    Allergies  Allergen Reactions   Biaxin [Clarithromycin] Other (See Comments)    GI upset    Current Meds  Medication Sig   albuterol  (VENTOLIN  HFA) 108 (90 Base) MCG/ACT inhaler Inhale 2 puffs into the lungs every 6 (six) hours as needed for wheezing or shortness of breath.   Cholecalciferol (VITAMIN D3) 50 MCG (2000 UT) TABS Take 2,000 Units by mouth daily.   ESBRIET  801 MG TABS Take 1 tablet (801 mg total) by mouth 3 (three) times daily with meals.   Fluticasone -Umeclidin-Vilant (TRELEGY ELLIPTA ) 100-62.5-25 MCG/ACT AEPB Inhale 1 puff into the lungs daily.   Lancets (ONETOUCH ULTRASOFT) lancets Use as instructed   metFORMIN  (GLUCOPHAGE -XR) 500 MG 24 hr tablet Take 1 tablet (500 mg total) by mouth daily with supper.   omeprazole  (PRILOSEC) 40 MG capsule Take 1 capsule (40 mg total) by mouth  daily before breakfast.   ONETOUCH VERIO test strip Check blood sugar up to 2 times daily as instructed   rosuvastatin  (CRESTOR ) 20 MG tablet Take 1 tablet (20  mg total) by mouth daily.   Treprostinil  (TYVASO  DPI INSTITUTIONAL KIT) 16 MCG POWD Inhale 16 mcg into the lungs in the morning, at noon, in the evening, and at bedtime.    Immunization History  Administered Date(s) Administered    sv, Bivalent, Protein Subunit Rsvpref,pf (Abrysvo) 11/26/2021   Fluad Quad(high Dose 65+) 12/08/2018, 12/09/2019, 11/28/2020, 11/30/2021   INFLUENZA, HIGH DOSE SEASONAL PF 12/12/2016, 12/17/2017, 11/26/2022, 11/27/2023   Influenza,inj,Quad PF,6+ Mos 11/18/2012, 12/21/2015   PFIZER Comirnaty(Gray Top)Covid-19 Tri-Sucrose Vaccine 06/29/2020   PFIZER(Purple Top)SARS-COV-2 Vaccination 03/20/2019, 04/10/2019, 11/18/2019   PNEUMOCOCCAL CONJUGATE-20 06/23/2023   Pfizer Covid-19 Vaccine Bivalent Booster 77yrs & up 11/17/2020   Pfizer Covid-19 Vaccine Bivalent Booster 5y-11y 11/26/2021   Pfizer(Comirnaty)Fall Seasonal Vaccine 12 years and older 11/26/2021, 11/26/2022   Pneumococcal Conjugate-13 12/02/2012   Pneumococcal Polysaccharide-23 06/11/2017   Respiratory Syncytial Virus Vaccine,Recomb Aduvanted(Arexvy) 11/26/2021   Td 11/20/2004, 12/21/2010   Tdap 12/21/2010, 11/26/2022        Objective:    BP 116/66   Pulse 78   Temp 97.7 F (36.5 C) (Temporal)   Ht 5' 9 (1.753 m)   Wt 215 lb 6.4 oz (97.7 kg)   SpO2 95% Comment: 2L oxygen   BMI 31.81 kg/m   SpO2: 95 % (2L oxygen )  GENERAL: Obese gentleman, no acute distress, fully ambulatory, no conversational dyspnea. HEAD: Normocephalic, atraumatic.  EYES: Pupils equal, round, reactive to light.  No scleral icterus.  MOUTH: Dentition intact, Mallampati III airway, no thrush. NECK: Supple. No thyromegaly. Trachea midline. No JVD.  No adenopathy. PULMONARY: Good air entry bilaterally.  Faint crackles at bases, no other adventitious  sounds.SABRA CARDIOVASCULAR: S1 and S2. Regular rate and rhythm.  Cards normal ABDOMEN: Obese, otherwise benign. MUSCULOSKELETAL: No joint deformity, no clubbing, no edema.  NEUROLOGIC: No overt focal deficit, no gait disturbance, speech is fluent. SKIN: Intact,warm,dry. PSYCH: Anxious mood.   Assessment & Plan:     ICD-10-CM   1. 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 Chronic respiratory failure with hypoxia (HCC)  J96.11     3. Pulmonary HTN (HCC)  I27.20     4. OSA (obstructive sleep apnea)  G47.33 AMB REFERRAL FOR DME    5. 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 Reports no improvement in breathing despite heart catheterization and initiation of Tyvaso . Issues with DPI causing cough and inadequate benefit. Nebulizer prescription not yet implemented due to communication issues with pharmacy. - Sent message to cardiologist regarding Tyvaso  prescription and nebulizer implementation.  Pulmonary hypertension Likely exacerbated by untreated obstructive sleep apnea. Cardiologist indicated prognosis of approximately one year if untreated. Discussed the importance of managing sleep apnea to prevent worsening of pulmonary hypertension. - Continue management of obstructive sleep apnea to prevent worsening of pulmonary hypertension.  Obstructive sleep apnea Severe obstructive sleep apnea with poor tolerance to CPAP therapy. Reports feeling smothered and increased daytime sleepiness. CPAP  settings adjusted to improve comfort and  compliance. Discussed the necessity of CPAP therapy to manage pulmonary hypertension and improve quality of life. - Adjusted CPAP settings: changed EPR to 3, decreased minimum pressure to 8, set maximum pressure at 14, and continued auto set. - Encouraged continued use of CPAP for a few more weeks to assess tolerance and effectiveness.  Influenza vaccination Due for influenza vaccination. - Administered influenza vaccine today.      Will see the patient in follow-up in 2 months time call sooner should any new problems arise.  Advised if symptoms do not improve or worsen, to please contact office for sooner follow up or seek emergency care.    I spent 40 minutes of dedicated to the care of this patient on the date of this encounter to include pre-visit review of records, face-to-face time with the patient discussing conditions above, post visit ordering of testing, clinical documentation with the electronic health record, making appropriate referrals as documented, and communicating necessary findings to members of the patients care team.     C. Leita Sanders, MD Advanced Bronchoscopy PCCM [facility]    *This note was generated using voice recognition software/Dragon and/or AI transcription program.  Despite best efforts to proofread, errors can occur which can change the meaning. Any transcriptional errors that result from this process are unintentional and may not be fully corrected at the time of dictation.

## 2023-11-27 NOTE — Patient Instructions (Signed)
 VISIT SUMMARY:  Today, we discussed your ongoing breathing difficulties related to pulmonary fibrosis and COPD, as well as issues with your current treatments. We also reviewed your pulmonary hypertension and obstructive sleep apnea management. Additionally, you received your influenza vaccination.  YOUR PLAN:  -PULMONARY FIBROSIS AND COPD WITH CHRONIC RESPIRATORY FAILURE WITH HYPOXIA: You have chronic respiratory failure with low oxygen  levels due to pulmonary fibrosis and COPD. Despite previous treatments, your breathing has not improved. We have contacted your cardiologist to address the issues with your current inhaler and to ensure you receive your nebulizer soon.  -PULMONARY HYPERTENSION: Pulmonary hypertension is high blood pressure in the lungs, which can worsen if your sleep apnea is not treated. It's important to manage your sleep apnea to prevent this condition from getting worse.  -OBSTRUCTIVE SLEEP APNEA: Obstructive sleep apnea is a condition where your breathing stops and starts during sleep. We have adjusted your CPAP machine settings to make it more comfortable for you. Please continue using the CPAP for a few more weeks to see if it helps.  -INFLUENZA VACCINATION: You received your flu shot today to help protect you from the influenza virus this season.  INSTRUCTIONS:  Please follow up with your cardiologist regarding the Tyvaso  prescription and the nebulizer. Continue using your CPAP machine with the new settings for a few more weeks and monitor your comfort and sleep quality. If you experience any issues, please contact our office.

## 2023-12-03 ENCOUNTER — Encounter: Payer: Self-pay | Admitting: Pulmonary Disease

## 2023-12-08 ENCOUNTER — Other Ambulatory Visit

## 2023-12-08 DIAGNOSIS — E785 Hyperlipidemia, unspecified: Secondary | ICD-10-CM | POA: Diagnosis not present

## 2023-12-08 DIAGNOSIS — N400 Enlarged prostate without lower urinary tract symptoms: Secondary | ICD-10-CM | POA: Diagnosis not present

## 2023-12-08 DIAGNOSIS — E1169 Type 2 diabetes mellitus with other specified complication: Secondary | ICD-10-CM

## 2023-12-08 DIAGNOSIS — Z0000 Encounter for general adult medical examination without abnormal findings: Secondary | ICD-10-CM

## 2023-12-08 DIAGNOSIS — J8410 Pulmonary fibrosis, unspecified: Secondary | ICD-10-CM

## 2023-12-09 LAB — LIPID PANEL
Cholesterol: 111 mg/dL (ref <200)
HDL: 44 mg/dL (ref >=40)
LDL Cholesterol (Calc): 50 mg/dL
Non-HDL Cholesterol (Calc): 67 mg/dL (ref <130)
Total CHOL/HDL Ratio: 2.5 (calc) (ref <5.0)
Triglycerides: 90 mg/dL (ref <150)

## 2023-12-09 LAB — COMPREHENSIVE METABOLIC PANEL WITH GFR
AG Ratio: 1.3 (calc) (ref 1.0–2.5)
ALT: 22 U/L (ref 9–46)
AST: 30 U/L (ref 10–35)
Albumin: 4.2 g/dL (ref 3.6–5.1)
Alkaline phosphatase (APISO): 107 U/L (ref 35–144)
BUN: 16 mg/dL (ref 7–25)
CO2: 26 mmol/L (ref 20–32)
Calcium: 9.7 mg/dL (ref 8.6–10.3)
Chloride: 98 mmol/L (ref 98–110)
Creat: 1.15 mg/dL (ref 0.70–1.28)
Globulin: 3.2 g/dL (ref 1.9–3.7)
Glucose, Bld: 149 mg/dL — ABNORMAL HIGH (ref 65–99)
Potassium: 5 mmol/L (ref 3.5–5.3)
Sodium: 141 mmol/L (ref 135–146)
Total Bilirubin: 1.3 mg/dL — ABNORMAL HIGH (ref 0.2–1.2)
Total Protein: 7.4 g/dL (ref 6.1–8.1)
eGFR: 68 mL/min/1.73m2 (ref >=60)

## 2023-12-09 LAB — CBC WITH DIFFERENTIAL/PLATELET
Absolute Lymphocytes: 1881 {cells}/uL (ref 850–3900)
Absolute Monocytes: 703 {cells}/uL (ref 200–950)
Basophils Absolute: 69 {cells}/uL (ref 0–200)
Basophils Relative: 0.7 %
Eosinophils Absolute: 109 {cells}/uL (ref 15–500)
Eosinophils Relative: 1.1 %
HCT: 49.6 % (ref 38.5–50.0)
Hemoglobin: 16 g/dL (ref 13.2–17.1)
MCH: 28.9 pg (ref 27.0–33.0)
MCHC: 32.3 g/dL (ref 32.0–36.0)
MCV: 89.7 fL (ref 80.0–100.0)
MPV: 11.9 fL (ref 7.5–12.5)
Monocytes Relative: 7.1 %
Neutro Abs: 7138 {cells}/uL (ref 1500–7800)
Neutrophils Relative %: 72.1 %
Platelets: 135 Thousand/uL — ABNORMAL LOW (ref 140–400)
RBC: 5.53 Million/uL (ref 4.20–5.80)
RDW: 13.8 % (ref 11.0–15.0)
Total Lymphocyte: 19 %
WBC: 9.9 Thousand/uL (ref 3.8–10.8)

## 2023-12-09 LAB — MICROALBUMIN / CREATININE URINE RATIO
Creatinine, Urine: 103 mg/dL (ref 20–320)
Microalb, Ur: >600 mg/dL

## 2023-12-09 LAB — HEMOGLOBIN A1C
Hgb A1c MFr Bld: 6.2 % — ABNORMAL HIGH (ref <5.7)
Mean Plasma Glucose: 131 mg/dL
eAG (mmol/L): 7.3 mmol/L

## 2023-12-09 LAB — TSH: TSH: 4.43 m[IU]/L (ref 0.40–4.50)

## 2023-12-09 LAB — PSA: PSA: 1.03 ng/mL (ref <=4.00)

## 2023-12-16 ENCOUNTER — Encounter: Payer: Self-pay | Admitting: Family Medicine

## 2023-12-16 ENCOUNTER — Ambulatory Visit: Admitting: Family Medicine

## 2023-12-16 VITALS — BP 134/80 | HR 78 | Ht 69.0 in | Wt 213.4 lb

## 2023-12-16 DIAGNOSIS — J9611 Chronic respiratory failure with hypoxia: Secondary | ICD-10-CM

## 2023-12-16 DIAGNOSIS — J849 Interstitial pulmonary disease, unspecified: Secondary | ICD-10-CM | POA: Diagnosis not present

## 2023-12-16 DIAGNOSIS — Z0000 Encounter for general adult medical examination without abnormal findings: Secondary | ICD-10-CM | POA: Diagnosis not present

## 2023-12-16 DIAGNOSIS — J439 Emphysema, unspecified: Secondary | ICD-10-CM

## 2023-12-16 DIAGNOSIS — Z7984 Long term (current) use of oral hypoglycemic drugs: Secondary | ICD-10-CM

## 2023-12-16 DIAGNOSIS — J8410 Pulmonary fibrosis, unspecified: Secondary | ICD-10-CM

## 2023-12-16 DIAGNOSIS — E1169 Type 2 diabetes mellitus with other specified complication: Secondary | ICD-10-CM

## 2023-12-16 DIAGNOSIS — E785 Hyperlipidemia, unspecified: Secondary | ICD-10-CM

## 2023-12-16 DIAGNOSIS — R809 Proteinuria, unspecified: Secondary | ICD-10-CM

## 2023-12-16 MED ORDER — JARDIANCE 10 MG PO TABS: 10.0000 mg | ORAL_TABLET | Freq: Every day | ORAL | 1 refills | Status: AC

## 2023-12-16 NOTE — Patient Instructions (Addendum)
 Thank you for coming to the office today.  Updates today  Stop Metformin   Start Jardiance 10mg  daily 90 day + 1 refill  The goal of the Jardiance is to reduce fluid, so it can help with swelling, heart failure prevention, and sugar control.   Remain off Metformin   Keep up with Diabetic Eye Exam  For lower extremity, goal is to elevate legs to help reduce swelling  Jardiance will help swelling.  Not a harm, but the loss of protein in urine can explain the swelling as well.  I will message Dr Zenaida and find out more information and share updates to him.   Please schedule a Follow-up Appointment to: Return in about 3 months (around 03/17/2024) for 3 month DM A1c, Heart/Lung updates.  If you have any other questions or concerns, please feel free to call the office or send a message through MyChart. You may also schedule an earlier appointment if necessary.  Additionally, you may be receiving a survey about your experience at our office within a few days to 1 week by e-mail or mail. We value your feedback.  Marsa Officer, DO [facility], [place]

## 2023-12-16 NOTE — Progress Notes (Unsigned)
 Subjective:    Patient ID: Paul Johnston, male    DOB: Oct 24, 1950, 73 y.o.   MRN: [id number]  Paul Johnston is a 73 y.o. male presenting on 12/16/2023 for Annual Exam   HPI  Discussed the use of AI scribe software for clinical note transcription with the patient, who gave verbal consent to proceed.  History of Present Illness   ***Lower Extremity Swelling R>L  *** Advanced HF Clinic Dr Zenaida, cannot get in touch with him on MyChart ***  *** LDL 50, continue rosuvastatin   ***Appetite reduced   Interstitial Lung Disease / Pulmonary FIbrosis Followed by Pulmonology Still admits increased dyspnea with progression of ILD. He is on Esbriet , considering future treatment options   Vitamin D  Deficiency Continue on D3 supplement   CHRONIC DM, Type 2 Obestiy BMI >34 A1c at 6.6 improved CBGs: Infrequent checks, none recently. Meds: Metformin  XR 500mg  once daily Reports good compliance. Tolerating well w/o side-effects Currently not on ACEi/ARB Update DM Eye Exam [place] Denies hypoglycemia, polyuria, visual changes, numbness or tingling.  ***Cardiology asking to    Resolved Creatinine elevation - normalized now.  Health Maintenance: ***     12/16/2023    2:31 PM 04/29/2023    2:13 PM 03/14/2023    3:23 PM  Depression screen PHQ 2/9  Decreased Interest 0 1 0  Down, Depressed, Hopeless 1 0 1  PHQ - 2 Score 1 1 1   Altered sleeping  1 0  Tired, decreased energy  1 0  Change in appetite  0 0  Feeling bad or failure about yourself   0 0  Trouble concentrating  0 0  Moving slowly or fidgety/restless  0 0  Suicidal thoughts  0 0  PHQ-9 Score  3 1  Difficult doing work/chores  Not difficult at all Not difficult at all       12/16/2023    2:31 PM 04/29/2023    2:13 PM 12/13/2022    3:10 PM 06/05/2022    3:44 PM  GAD 7 : Generalized Anxiety Score  Nervous, Anxious, on Edge 0 0 0 0  Control/stop worrying 0 0 0 0  Worry too much - different things 1 0 0 0  Trouble  relaxing 0 0 0 0  Restless 0 0 0 0  Easily annoyed or irritable 0 0 0 0  Afraid - awful might happen 0 0 0 0  Total GAD 7 Score 1 0 0 0     Past Medical History:  Diagnosis Date   Allergy    Arthritis    Colon cancer screening 03/08/2022   Diabetes mellitus without complication (HCC)    GERD (gastroesophageal reflux disease)    Hyperlipidemia    Non-melanoma skin cancer    Ruptured intervertebral disc    Sleep apnea    Was diagnosed with moderate to light sleep apnea   Ulcer    Past Surgical History:  Procedure Laterality Date   APPENDECTOMY     COLONOSCOPY     FRACTURE SURGERY  1980's   Lower 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   HERNIA REPAIR  2014?   Umbilical hernia. Mesh installed.   INSERTION OF MESH N/A 07/16/2012   Procedure: INSERTION OF MESH;  Surgeon: Vicenta DELENA Poli, MD;  Location: [facility];  Service: General;  Laterality: N/A;   ORBITAL FRACTURE SURGERY     RIGHT HEART CATH Right 08/28/2023   Procedure: RIGHT HEART CATH;  Surgeon: Gardenia Led, DO;  Location: [facility];  Service: Cardiovascular;  Laterality: Right;   UMBILICAL HERNIA REPAIR N/A 07/16/2012   Procedure: HERNIA REPAIR UMBILICAL WITH MESH;  Surgeon: Vicenta DELENA Poli, MD;  Location: [facility];  Service: General;  Laterality: N/A;   UPPER GI ENDOSCOPY     Social History   Socioeconomic History   Marital status: Divorced    Spouse name: Not on file   Number of children: 2   Years of education: Not on file   Highest education level: Associate degree: occupational, scientist, product/process development, or vocational program  Occupational History    Employer: [employer]  Tobacco Use   Smoking status: Former    Current packs/day: 0.00    Average packs/day: 1 pack/day for 33.0 years (33.0 ttl pk-yrs)    Types: Cigarettes    Start date: 09/07/1975    Quit date: 09/06/2008    Years since quitting: 15.2    Passive exposure: Past   Smokeless tobacco: Former  Haematologist status: Never Used  Substance and Sexual Activity   Alcohol use: No   Drug use: No   Sexual activity: Not on file  Other Topics Concern   Not on file  Social History Narrative   Two natural children and three step.  Lives with wife.    Social Drivers of Corporate Investment Banker Strain: Low Risk  (12/12/2023)   Overall Financial Resource Strain (CARDIA)    Difficulty of Paying Living Expenses: Not very hard  Food Insecurity: No Food Insecurity (12/12/2023)   Hunger Vital Sign    Worried About Running Out of Food in the Last Year: Never true    Ran Out of Food in the Last Year: Never true  Transportation Needs: Unmet Transportation Needs (12/12/2023)   PRAPARE - Administrator, Civil Service (Medical): Yes    Lack of Transportation (Non-Medical): Patient declined  Physical Activity: Inactive (12/12/2023)   Exercise Vital Sign    Days of Exercise per Week: 0 days    Minutes of Exercise per Session: Not on file  Stress: Stress Concern Present (12/12/2023)   [employer] of Occupational Health - Occupational Stress Questionnaire    Feeling of Stress: Rather much  Social Connections: Socially Isolated (12/12/2023)   Social Connection and Isolation Panel    Frequency of Communication with Friends and Family: More than three times a week    Frequency of Social Gatherings with Friends and Family: Three times a week    Attends Religious Services: Never    Active Member of Clubs or Organizations: No    Attends Banker Meetings: Not on file    Marital Status: Divorced  Intimate Partner Violence: Not At Risk (03/14/2023)   Humiliation, Afraid, Rape, and Kick questionnaire    Fear of Current or Ex-Partner: No    Emotionally Abused: No    Physically Abused: No    Sexually Abused: No   Family History  Problem Relation Age of Onset   Emphysema Father        died age 78   Heart failure Father    COPD Father    Cancer Mother    Vision loss  Mother    Heart disease Paternal Grandfather    COPD Paternal Grandfather    Current Outpatient Medications on File Prior to Visit  Medication Sig   albuterol  (VENTOLIN  HFA) 108 (90 Base) MCG/ACT inhaler Inhale 2 puffs into the lungs every 6 (six) hours as needed for wheezing  or shortness of breath.   Cholecalciferol (VITAMIN D3) 50 MCG (2000 UT) TABS Take 2,000 Units by mouth daily.   ESBRIET  801 MG TABS Take 1 tablet (801 mg total) by mouth 3 (three) times daily with meals.   Fluticasone -Umeclidin-Vilant (TRELEGY ELLIPTA ) 100-62.5-25 MCG/ACT AEPB Inhale 1 puff into the lungs daily.   Lancets (ONETOUCH ULTRASOFT) lancets Use as instructed   omeprazole  (PRILOSEC) 40 MG capsule Take 1 capsule (40 mg total) by mouth daily before breakfast.   ONETOUCH VERIO test strip Check blood sugar up to 2 times daily as instructed   rosuvastatin  (CRESTOR ) 20 MG tablet Take 1 tablet (20 mg total) by mouth daily.   Treprostinil  (TYVASO  DPI INSTITUTIONAL KIT) 16 MCG POWD Inhale 16 mcg into the lungs in the morning, at noon, in the evening, and at bedtime.   No current facility-administered medications on file prior to visit.    Review of Systems Per HPI unless specifically indicated above     Objective:    BP 134/80 (BP Location: Right Arm, Patient Position: Sitting, Cuff Size: Normal)   Pulse 78   Ht 5' 9 (1.753 m)   Wt 213 lb 6 oz (96.8 kg)   SpO2 91%   BMI 31.51 kg/m   Wt Readings from Last 3 Encounters:  12/16/23 213 lb 6 oz (96.8 kg)  11/27/23 215 lb 6.4 oz (97.7 kg)  10/17/23 210 lb (95.3 kg)    Physical Exam Vitals and nursing note reviewed.  Constitutional:      General: He is not in acute distress.    Appearance: He is well-developed. He is obese. He is not diaphoretic.     Comments: Well-appearing, comfortable, cooperative  HENT:     Head: Normocephalic and atraumatic.  Eyes:     General:        Right eye: No discharge.        Left eye: No discharge.     Conjunctiva/sclera:  Conjunctivae normal.     Pupils: Pupils are equal, round, and reactive to light.  Neck:     Thyroid : No thyromegaly.  Cardiovascular:     Rate and Rhythm: Normal rate and regular rhythm.     Pulses: Normal pulses.     Heart sounds: Normal heart sounds. No murmur heard. Pulmonary:     Effort: Pulmonary effort is normal. No respiratory distress.     Breath sounds: Wheezing present. No rales.     Comments: Reduced air movement. Nasal cannula, continuous oxygen  Abdominal:     General: Bowel sounds are normal. There is no distension.     Palpations: Abdomen is soft. There is no mass.     Tenderness: There is no abdominal tenderness.  Musculoskeletal:        General: No tenderness. Normal range of motion.     Cervical back: Normal range of motion and neck supple.     Right lower leg: Edema (R>L pitting edema +2) present.     Left lower leg: No edema.     Comments: Upper / Lower Extremities: - Normal muscle tone, strength bilateral upper extremities 5/5, lower extremities 5/5  Lymphadenopathy:     Cervical: No cervical adenopathy.  Skin:    General: Skin is warm and dry.     Findings: No erythema or rash.  Neurological:     Mental Status: He is alert and oriented to person, place, and time.     Comments: Distal sensation intact to light touch all extremities  Psychiatric:  Mood and Affect: Mood normal.        Behavior: Behavior normal.        Thought Content: Thought content normal.     Comments: Well groomed, good eye contact, normal speech and thoughts     Diabetic Foot Exam - Simple   Simple Foot Form Diabetic Foot exam was performed with the following findings: Yes 12/16/2023  3:06 PM  Visual Inspection See comments: Yes Sensation Testing Intact to touch and monofilament testing bilaterally: Yes Pulse Check Posterior Tibialis and Dorsalis pulse intact bilaterally: Yes Comments Bilateral thickened toenails. Intact monofilament sensation. No ulceration or callus.        Results for orders placed or performed in visit on 12/08/23  Comprehensive metabolic panel with GFR   Collection Time: 12/08/23 11:00 AM  Result Value Ref Range   Glucose, Bld 149 (H) 65 - 99 mg/dL   BUN 16 7 - 25 mg/dL   Creat 8.84 9.29 - 8.71 mg/dL   eGFR 68 > OR = 60 fO/fpw/8.26f7   BUN/Creatinine Ratio SEE NOTE: 6 - 22 (calc)   Sodium 141 135 - 146 mmol/L   Potassium 5.0 3.5 - 5.3 mmol/L   Chloride 98 98 - 110 mmol/L   CO2 26 20 - 32 mmol/L   Calcium  9.7 8.6 - 10.3 mg/dL   Total Protein 7.4 6.1 - 8.1 g/dL   Albumin 4.2 3.6 - 5.1 g/dL   Globulin 3.2 1.9 - 3.7 g/dL (calc)   AG Ratio 1.3 1.0 - 2.5 (calc)   Total Bilirubin 1.3 (H) 0.2 - 1.2 mg/dL   Alkaline phosphatase (APISO) 107 35 - 144 U/L   AST 30 10 - 35 U/L   ALT 22 9 - 46 U/L  TSH   Collection Time: 12/08/23 11:00 AM  Result Value Ref Range   TSH 4.43 0.40 - 4.50 mIU/L  Microalbumin / creatinine urine ratio   Collection Time: 12/08/23 11:00 AM  Result Value Ref Range   Creatinine, Urine 103 20 - 320 mg/dL   Microalb, Ur >399.9 mg/dL   Microalb Creat Ratio NOTE <30 mg/g creat  PSA   Collection Time: 12/08/23 11:00 AM  Result Value Ref Range   PSA 1.03 < OR = 4.00 ng/mL  CBC with Differential/Platelet   Collection Time: 12/08/23 11:00 AM  Result Value Ref Range   WBC 9.9 3.8 - 10.8 Thousand/uL   RBC 5.53 4.20 - 5.80 Million/uL   Hemoglobin 16.0 13.2 - 17.1 g/dL   HCT 50.3 61.4 - 49.9 %   MCV 89.7 80.0 - 100.0 fL   MCH 28.9 27.0 - 33.0 pg   MCHC 32.3 32.0 - 36.0 g/dL   RDW 86.1 88.9 - 84.9 %   Platelets 135 (L) 140 - 400 Thousand/uL   MPV 11.9 7.5 - 12.5 fL   Neutro Abs 7,138 1,500 - 7,800 cells/uL   Absolute Lymphocytes 1,881 850 - 3,900 cells/uL   Absolute Monocytes 703 200 - 950 cells/uL   Eosinophils Absolute 109 15 - 500 cells/uL   Basophils Absolute 69 0 - 200 cells/uL   Neutrophils Relative % 72.1 %   Total Lymphocyte 19.0 %   Monocytes Relative 7.1 %   Eosinophils Relative 1.1 %    Basophils Relative 0.7 %  Hemoglobin A1c   Collection Time: 12/08/23 11:00 AM  Result Value Ref Range   Hgb A1c MFr Bld 6.2 (H) <5.7 %   Mean Plasma Glucose 131 mg/dL   eAG (mmol/L) 7.3 mmol/L  Lipid panel  Collection Time: 12/08/23 11:00 AM  Result Value Ref Range   Cholesterol 111 <200 mg/dL   HDL 44 > OR = 40 mg/dL   Triglycerides 90 <849 mg/dL   LDL Cholesterol (Calc) 50 mg/dL (calc)   Total CHOL/HDL Ratio 2.5 <5.0 (calc)   Non-HDL Cholesterol (Calc) 67 <869 mg/dL (calc)      Assessment & Plan:   Problem List Items Addressed This Visit     Interstitial lung disease (HCC)   Pulmonary fibrosis (HCC)   Type 2 diabetes mellitus with other specified complication (HCC)   Relevant Medications   JARDIANCE 10 MG TABS tablet   Other Visit Diagnoses       Annual physical exam    -  Primary        Updated Health Maintenance information Reviewed recent lab results with patient Encouraged improvement to lifestyle with diet and exercise Goal of weight loss  Assessment and Plan Assessment & Plan    ***Message Dr Zenaida, patient asking about mychart open communications and notify him I am starting jardiance 10mg  and stop metformin  Question changing from nebulizer   No orders of the defined types were placed in this encounter.   Meds ordered this encounter  Medications   JARDIANCE 10 MG TABS tablet    Sig: Take 1 tablet (10 mg total) by mouth daily.    Dispense:  90 tablet    Refill:  1     Follow up plan: Return in about 3 months (around 03/17/2024) for 3 month DM A1c, Heart/Lung updates.  ***3-4-6 months for next visit A1c, Urine Microalbumin  Marsa Officer, DO [facility] 12/16/2023, 2:46 PM

## 2023-12-17 NOTE — Progress Notes (Incomplete)
 Subjective:    Patient ID: Paul Johnston, male    DOB: 06-15-1950, 73 y.o.   MRN: [id number]  Paul Johnston is a 73 y.o. male presenting on 12/16/2023 for Annual Exam   HPI  Discussed the use of AI scribe software for clinical note transcription with the patient, who gave verbal consent to proceed.  History of Present Illness   Paul Johnston is a 73 year old male with diabetes and heart failure who presents for a routine annual check-up and to discuss recent lab results.  Peripheral edema - Bilateral lower extremity swelling, right greater than left - Pitting edema extends to mid-leg and dorsum of both feet - Swelling described as 'aggravating' due to fluid accumulation - Difficulty putting on shoes - No associated pain  Dyspnea and respiratory symptoms - Shortness of breath limiting ability to attend appointments - Congestion and wheezing present - Difficulty using CPAP due to discomfort and congestion - Impaired sleep quality secondary to CPAP intolerance  Appetite and taste disturbance - Decreased appetite - Altered taste sensation, likened to effects of chemotherapy experienced by his mother  Diabetes mellitus management - Current therapy with metformin  - Previously discussed switching to Jardiance for heart failure benefit - Concerned about cost difference between metformin  and Jardiance  Blood pressure abnormalities - History of low blood pressure readings - Recent blood pressure measured at 88/50 mmHg - Losartan  discontinued due to hypotension  Medication concerns - Concern about potential interactions between current medications, including rosuvastatin , and cardiac effects  Renal findings - Recent laboratory results with elevated microalbumin - Normal kidney function on recent blood tests  Tobacco use history - Former smoker, quit 15 years ago       ***Lower Extremity Swelling R>L  *** [facility] Dr Zenaida, cannot get in touch with him on  MyChart ***  *** LDL 50, continue rosuvastatin   ***Appetite reduced   Interstitial Lung Disease / Pulmonary FIbrosis Followed by Pulmonology Still admits increased dyspnea with progression of ILD. He is on Esbriet , considering future treatment options   Vitamin D  Deficiency Continue on D3 supplement   CHRONIC DM, Type 2 Obestiy BMI >34 A1c at 6.6 improved CBGs: Infrequent checks, none recently. Meds: Metformin  XR 500mg  once daily Reports good compliance. Tolerating well w/o side-effects Currently not on ACEi/ARB Update DM Eye Exam [place] Denies hypoglycemia, polyuria, visual changes, numbness or tingling.  ***Cardiology asking to    Resolved Creatinine elevation - normalized now.  Health Maintenance: ***     12/16/2023    2:31 PM 04/29/2023    2:13 PM 03/14/2023    3:23 PM  Depression screen PHQ 2/9  Decreased Interest 0 1 0  Down, Depressed, Hopeless 1 0 1  PHQ - 2 Score 1 1 1   Altered sleeping  1 0  Tired, decreased energy  1 0  Change in appetite  0 0  Feeling bad or failure about yourself   0 0  Trouble concentrating  0 0  Moving slowly or fidgety/restless  0 0  Suicidal thoughts  0 0  PHQ-9 Score  3 1  Difficult doing work/chores  Not difficult at all Not difficult at all       12/16/2023    2:31 PM 04/29/2023    2:13 PM 12/13/2022    3:10 PM 06/05/2022    3:44 PM  GAD 7 : Generalized Anxiety Score  Nervous, Anxious, on Edge 0 0 0 0  Control/stop worrying 0 0 0 0  Worry too  much - different things 1 0 0 0  Trouble relaxing 0 0 0 0  Restless 0 0 0 0  Easily annoyed or irritable 0 0 0 0  Afraid - awful might happen 0 0 0 0  Total GAD 7 Score 1 0 0 0     Past Medical History:  Diagnosis Date  . Allergy   . Arthritis   . Colon cancer screening 03/08/2022  . Diabetes mellitus without complication (HCC)   . GERD (gastroesophageal reflux disease)   . Hyperlipidemia   . Non-melanoma skin cancer   . Ruptured intervertebral disc   . Sleep apnea     Was diagnosed with moderate to light sleep apnea  . Ulcer    Past Surgical History:  Procedure Laterality Date  . APPENDECTOMY    . COLONOSCOPY    . FRACTURE SURGERY  1980's   Lower 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  SABRA HERNIA REPAIR  2014?   Umbilical hernia. Mesh installed.  . INSERTION OF MESH N/A 07/16/2012   Procedure: INSERTION OF MESH;  Surgeon: Vicenta DELENA Poli, MD;  Location: [facility];  Service: General;  Laterality: N/A;  . ORBITAL FRACTURE SURGERY    . RIGHT HEART CATH Right 08/28/2023   Procedure: RIGHT HEART CATH;  Surgeon: Gardenia Led, DO;  Location: [facility];  Service: Cardiovascular;  Laterality: Right;  . UMBILICAL HERNIA REPAIR N/A 07/16/2012   Procedure: HERNIA REPAIR UMBILICAL WITH MESH;  Surgeon: Vicenta DELENA Poli, MD;  Location: [facility];  Service: General;  Laterality: N/A;  . UPPER GI ENDOSCOPY     Social History   Socioeconomic History  . Marital status: Divorced    Spouse name: Not on file  . Number of children: 2  . Years of education: Not on file  . Highest education level: Associate degree: occupational, scientist, product/process development, or vocational program  Occupational History    Employer: [employer]  Tobacco Use  . Smoking status: Former    Current packs/day: 0.00    Average packs/day: 1 pack/day for 33.0 years (33.0 ttl pk-yrs)    Types: Cigarettes    Start date: 09/07/1975    Quit date: 09/06/2008    Years since quitting: 15.2    Passive exposure: Past  . Smokeless tobacco: Former  Advertising Account Planner  . Vaping status: Never Used  Substance and Sexual Activity  . Alcohol use: No  . Drug use: No  . Sexual activity: Not on file  Other Topics Concern  . Not on file  Social History Narrative   Two natural children and three step.  Lives with wife.    Social Drivers of Corporate Investment Banker Strain: Low Risk  (12/12/2023)   Overall Financial Resource Strain (CARDIA)   . Difficulty of Paying Living  Expenses: Not very hard  Food Insecurity: No Food Insecurity (12/12/2023)   Hunger Vital Sign   . Worried About Programme Researcher, Broadcasting/film/video in the Last Year: Never true   . Ran Out of Food in the Last Year: Never true  Transportation Needs: Unmet Transportation Needs (12/12/2023)   PRAPARE - Transportation   . Lack of Transportation (Medical): Yes   . Lack of Transportation (Non-Medical): Patient declined  Physical Activity: Inactive (12/12/2023)   Exercise Vital Sign   . Days of Exercise per Week: 0 days   . Minutes of Exercise per Session: Not on file  Stress: Stress Concern Present (12/12/2023)   [employer] of Occupational Health -  Occupational Stress Questionnaire   . Feeling of Stress: Rather much  Social Connections: Socially Isolated (12/12/2023)   Social Connection and Isolation Panel   . Frequency of Communication with Friends and Family: More than three times a week   . Frequency of Social Gatherings with Friends and Family: Three times a week   . Attends Religious Services: Never   . Active Member of Clubs or Organizations: No   . Attends Banker Meetings: Not on file   . Marital Status: Divorced  Catering Manager Violence: Not At Risk (03/14/2023)   Humiliation, Afraid, Rape, and Kick questionnaire   . Fear of Current or Ex-Partner: No   . Emotionally Abused: No   . Physically Abused: No   . Sexually Abused: No   Family History  Problem Relation Age of Onset  . Emphysema Father        died age 78  . Heart failure Father   . COPD Father   . Cancer Mother   . Vision loss Mother   . Heart disease Paternal Grandfather   . COPD Paternal Grandfather    Current Outpatient Medications on File Prior to Visit  Medication Sig  . albuterol  (VENTOLIN  HFA) 108 (90 Base) MCG/ACT inhaler Inhale 2 puffs into the lungs every 6 (six) hours as needed for wheezing or shortness of breath.  . Cholecalciferol (VITAMIN D3) 50 MCG (2000 UT) TABS Take 2,000 Units by mouth  daily.  . ESBRIET  801 MG TABS Take 1 tablet (801 mg total) by mouth 3 (three) times daily with meals.  . Fluticasone -Umeclidin-Vilant (TRELEGY ELLIPTA ) 100-62.5-25 MCG/ACT AEPB Inhale 1 puff into the lungs daily.  . Lancets (ONETOUCH ULTRASOFT) lancets Use as instructed  . omeprazole  (PRILOSEC) 40 MG capsule Take 1 capsule (40 mg total) by mouth daily before breakfast.  . ONETOUCH VERIO test strip Check blood sugar up to 2 times daily as instructed  . rosuvastatin  (CRESTOR ) 20 MG tablet Take 1 tablet (20 mg total) by mouth daily.  . Treprostinil  (TYVASO  DPI INSTITUTIONAL KIT) 16 MCG POWD Inhale 16 mcg into the lungs in the morning, at noon, in the evening, and at bedtime.   No current facility-administered medications on file prior to visit.    Review of Systems Per HPI unless specifically indicated above     Objective:    BP 134/80 (BP Location: Right Arm, Patient Position: Sitting, Cuff Size: Normal)   Pulse 78   Ht 5' 9 (1.753 m)   Wt 213 lb 6 oz (96.8 kg)   SpO2 91%   BMI 31.51 kg/m   Wt Readings from Last 3 Encounters:  12/16/23 213 lb 6 oz (96.8 kg)  11/27/23 215 lb 6.4 oz (97.7 kg)  10/17/23 210 lb (95.3 kg)    Physical Exam Vitals and nursing note reviewed.  Constitutional:      General: He is not in acute distress.    Appearance: He is well-developed. He is obese. He is not diaphoretic.     Comments: Well-appearing, comfortable, cooperative  HENT:     Head: Normocephalic and atraumatic.  Eyes:     General:        Right eye: No discharge.        Left eye: No discharge.     Conjunctiva/sclera: Conjunctivae normal.     Pupils: Pupils are equal, round, and reactive to light.  Neck:     Thyroid : No thyromegaly.  Cardiovascular:     Rate and Rhythm: Normal rate and regular rhythm.  Pulses: Normal pulses.     Heart sounds: Normal heart sounds. No murmur heard. Pulmonary:     Effort: Pulmonary effort is normal. No respiratory distress.     Breath sounds:  Wheezing present. No rales.     Comments: Reduced air movement. Nasal cannula, continuous oxygen  Abdominal:     General: Bowel sounds are normal. There is no distension.     Palpations: Abdomen is soft. There is no mass.     Tenderness: There is no abdominal tenderness.  Musculoskeletal:        General: No tenderness. Normal range of motion.     Cervical back: Normal range of motion and neck supple.     Right lower leg: Edema (R>L pitting edema +2) present.     Left lower leg: No edema.     Comments: Upper / Lower Extremities: - Normal muscle tone, strength bilateral upper extremities 5/5, lower extremities 5/5  Lymphadenopathy:     Cervical: No cervical adenopathy.  Skin:    General: Skin is warm and dry.     Findings: No erythema or rash.  Neurological:     Mental Status: He is alert and oriented to person, place, and time.     Comments: Distal sensation intact to light touch all extremities  Psychiatric:        Mood and Affect: Mood normal.        Behavior: Behavior normal.        Thought Content: Thought content normal.     Comments: Well groomed, good eye contact, normal speech and thoughts     Diabetic Foot Exam - Simple   Simple Foot Form Diabetic Foot exam was performed with the following findings: Yes 12/16/2023  3:06 PM  Visual Inspection See comments: Yes Sensation Testing Intact to touch and monofilament testing bilaterally: Yes Pulse Check Posterior Tibialis and Dorsalis pulse intact bilaterally: Yes Comments Bilateral thickened toenails. Intact monofilament sensation. No ulceration or callus.       Results for orders placed or performed in visit on 12/08/23  Comprehensive metabolic panel with GFR   Collection Time: 12/08/23 11:00 AM  Result Value Ref Range   Glucose, Bld 149 (H) 65 - 99 mg/dL   BUN 16 7 - 25 mg/dL   Creat 8.84 9.29 - 8.71 mg/dL   eGFR 68 > OR = 60 fO/fpw/8.26f7   BUN/Creatinine Ratio SEE NOTE: 6 - 22 (calc)   Sodium 141 135 - 146  mmol/L   Potassium 5.0 3.5 - 5.3 mmol/L   Chloride 98 98 - 110 mmol/L   CO2 26 20 - 32 mmol/L   Calcium  9.7 8.6 - 10.3 mg/dL   Total Protein 7.4 6.1 - 8.1 g/dL   Albumin 4.2 3.6 - 5.1 g/dL   Globulin 3.2 1.9 - 3.7 g/dL (calc)   AG Ratio 1.3 1.0 - 2.5 (calc)   Total Bilirubin 1.3 (H) 0.2 - 1.2 mg/dL   Alkaline phosphatase (APISO) 107 35 - 144 U/L   AST 30 10 - 35 U/L   ALT 22 9 - 46 U/L  TSH   Collection Time: 12/08/23 11:00 AM  Result Value Ref Range   TSH 4.43 0.40 - 4.50 mIU/L  Microalbumin / creatinine urine ratio   Collection Time: 12/08/23 11:00 AM  Result Value Ref Range   Creatinine, Urine 103 20 - 320 mg/dL   Microalb, Ur >399.9 mg/dL   Microalb Creat Ratio NOTE <30 mg/g creat  PSA   Collection Time: 12/08/23 11:00 AM  Result Value Ref Range   PSA 1.03 < OR = 4.00 ng/mL  CBC with Differential/Platelet   Collection Time: 12/08/23 11:00 AM  Result Value Ref Range   WBC 9.9 3.8 - 10.8 Thousand/uL   RBC 5.53 4.20 - 5.80 Million/uL   Hemoglobin 16.0 13.2 - 17.1 g/dL   HCT 50.3 61.4 - 49.9 %   MCV 89.7 80.0 - 100.0 fL   MCH 28.9 27.0 - 33.0 pg   MCHC 32.3 32.0 - 36.0 g/dL   RDW 86.1 88.9 - 84.9 %   Platelets 135 (L) 140 - 400 Thousand/uL   MPV 11.9 7.5 - 12.5 fL   Neutro Abs 7,138 1,500 - 7,800 cells/uL   Absolute Lymphocytes 1,881 850 - 3,900 cells/uL   Absolute Monocytes 703 200 - 950 cells/uL   Eosinophils Absolute 109 15 - 500 cells/uL   Basophils Absolute 69 0 - 200 cells/uL   Neutrophils Relative % 72.1 %   Total Lymphocyte 19.0 %   Monocytes Relative 7.1 %   Eosinophils Relative 1.1 %   Basophils Relative 0.7 %  Hemoglobin A1c   Collection Time: 12/08/23 11:00 AM  Result Value Ref Range   Hgb A1c MFr Bld 6.2 (H) <5.7 %   Mean Plasma Glucose 131 mg/dL   eAG (mmol/L) 7.3 mmol/L  Lipid panel   Collection Time: 12/08/23 11:00 AM  Result Value Ref Range   Cholesterol 111 <200 mg/dL   HDL 44 > OR = 40 mg/dL   Triglycerides 90 <849 mg/dL   LDL  Cholesterol (Calc) 50 mg/dL (calc)   Total CHOL/HDL Ratio 2.5 <5.0 (calc)   Non-HDL Cholesterol (Calc) 67 <869 mg/dL (calc)      Assessment & Plan:   Problem List Items Addressed This Visit     Interstitial lung disease (HCC)   Pulmonary fibrosis (HCC)   Type 2 diabetes mellitus with other specified complication (HCC)   Relevant Medications   JARDIANCE 10 MG TABS tablet   Other Visit Diagnoses       Annual physical exam    -  Primary        Updated Health Maintenance information Reviewed recent lab results with patient Encouraged improvement to lifestyle with diet and exercise Goal of weight loss   Adult Wellness Visit Routine check-up with well-controlled cholesterol and blood sugar. Normal blood counts, PSA, kidney, liver, and thyroid  function. No anemia or prostate cancer. Microalbuminuria noted but not indicative of kidney failure. - Continue rosuvastatin  for cholesterol management. - Encouraged completion of overdue eye exam.  Pulmonary Fibrosis / Chronic Respiratory Failure Followed by Pulmonology On Supplemental continuous oxygen  On Tyvaso   Type 2 diabetes mellitus Well-controlled with A1c of 6.2. Discussed switching to Jardiance for heart failure and fluid management benefits. Jardiance may reduce heart failure exacerbations and fluid buildup, stronger for sugar control. Potential risk of urinary tract infection. Higher cost than metformin . - Discontinued metformin . - Initiated Jardiance 10 mg daily with a 90-day supply and one refill. Adjust dose to 25mg  if indicated in future. - Monitor for urinary tract infection. - Scheduled follow-up in three months to assess A1c and medication efficacy.  Pitting edema of bilateral lower extremities Pitting edema likely related to fluid retention and proteinuria. Jardiance expected to reduce fluid buildup. - Initiated Jardiance to help reduce fluid retention and edema.  Microalbuminuria Likely secondary to fluid  retention and heart failure. Jardiance expected to reduce proteinuria. - Initiated Jardiance to help reduce proteinuria. - Will repeat urine test at next follow-up to  assess proteinuria.  Obstructive sleep apnea, nonadherent to CPAP Nonadherence due to difficulty and discomfort with CPAP. Congestion may exacerbate discomfort.         ***Message Dr Zenaida, patient asking about mychart open communications and notify him I am starting jardiance 10mg  and stop metformin  Question changing from nebulizer   No orders of the defined types were placed in this encounter.   Meds ordered this encounter  Medications  . JARDIANCE 10 MG TABS tablet    Sig: Take 1 tablet (10 mg total) by mouth daily.    Dispense:  90 tablet    Refill:  1     Follow up plan: Return in about 3 months (around 03/17/2024) for 3 month DM A1c, Heart/Lung updates.  3 months for next visit A1c, Urine Microalbumin  Marsa Officer, DO [facility] 12/16/2023, 2:46 PM

## 2024-01-05 ENCOUNTER — Telehealth: Payer: Self-pay | Admitting: Family

## 2024-01-05 ENCOUNTER — Ambulatory Visit: Admitting: Cardiology

## 2024-01-05 NOTE — Telephone Encounter (Signed)
 Called to confirm/remind patient of their appointment at the [facility] on 01/06/24.   Appointment:   [x] Confirmed  [] Left mess   [] No answer/No voice mail  [] VM Full/unable to leave message  [] Phone not in service  Patient reminded to bring all medications and/or complete list.  Confirmed patient has transportation. Gave directions, instructed to utilize valet parking.

## 2024-01-05 NOTE — Progress Notes (Unsigned)
   ADVANCED HEART FAILURE FOLLOW UP CLINIC NOTE  Referring Physician: Edman Blunt *  Primary Care: Edman Blunt PARAS, DO Primary Cardiologist:  HPI: Paul Johnston is a 73 y.o. male who presents for follow up of pulmonary hypertension.         Patient has a longstanding history of pulmonary fibrosis and COPD, has been following with pulmonary for some time.  He reports that he had been doing fairly well, until he was diagnosed with recent pneumonia and has since seen a precipitous drop in his functional status.  He was referred to advanced heart failure clinic given concerning findings for pulmonary hypertension on both echocardiogram and CT.  Underwent right heart catheterization showing severely elevated pulmonary arterial pressures, severely reduced cardiac output.     SUBJECTIVE:  Patient continues to feel extremely poorly.  He has had a lot of coughing issues with the Tyvaso , though he initially felt better on the medication.  He reports that he feels short of breath doing even minimal activities such as taking a shower.  We discussed pulmonary rehab, but he does not feel that he can move around enough to engage in those exercises.  We did discuss overall prognosis, which I noted is likely extremely poor given his advanced pulmonary disease as well as pulmonary hypertension.  We will attempt to modify his cardiac output is much as we can but there are limited therapies for his already advanced lung disease.  Do not believe that he is quite ready for hospice, but if he has significant worsening he may benefit from palliative care referral.  PMH, current medications, allergies, social history, and family history reviewed in epic.  PHYSICAL EXAM: There were no vitals filed for this visit.  GENERAL: NAD, chronically ill appearing PULM: Bilateral rhonchi and crackles CARDIAC:  JVP: flat         Normal rate with regular rhythm.  Prominent and split P2.  Trace edema.  Warm and well perfused extremities. ABDOMEN: Soft, non-tender, non-distended. NEUROLOGIC: Patient is oriented x3 with no focal or lateralizing neurologic deficits.     DATA REVIEW  ECG: None since 2019   ECHO: 03/2023: LVEF 50 to 55%, mildly reduced RV systolic function with moderate enlargement, moderately elevated PA systolic pressure.   CATH: 08/2023: RA 14, PA 92/45 (61), PCWP 14, TD CO/CI 3.5/1.67, PVR 13.4 wood units  ASSESSMENT & PLAN:  pulmonary hypertension: Patient with known idiopathic pulmonary fibrosis and recent worsening of his functional status, on chronic home oxygen .  WHO FC IV, RHC concerning with elevated RA pressures, severely reduced CI. Primarily group III.  -Transition inhaled to nebulized Tyvaso  given coughing issue -Would titrate to maximum and then can consider starting low-dose PDE 5 given severely elevated PA pressures and advanced symptoms -Would benefit from pulmonary rehab when able - Starting to wear his CPAP, discussed the importance of this - Continue management of IPF as below - No blood pressure medications   IPF: Follows with pulmonary, last DLCO 41%.  - Continue Pirfenidone  801mg  TID - Inhaler therapy per pulmonary   HLD:  - Continue crestor  20mg  daily   DM2:  - Continue metformin  500mg  daily, consider Jardiance  at next visit given elevated RA pressures  Follow up in 2 months  [name], MD Advanced Heart Failure Mechanical Circulatory Support 01/05/24

## 2024-01-06 ENCOUNTER — Ambulatory Visit: Attending: Family | Admitting: Family

## 2024-01-06 ENCOUNTER — Other Ambulatory Visit (HOSPITAL_COMMUNITY): Payer: Self-pay

## 2024-01-06 ENCOUNTER — Telehealth: Payer: Self-pay

## 2024-01-06 ENCOUNTER — Encounter: Payer: Self-pay | Admitting: Family

## 2024-01-06 VITALS — BP 157/89 | HR 88 | Wt 193.1 lb

## 2024-01-06 DIAGNOSIS — E785 Hyperlipidemia, unspecified: Secondary | ICD-10-CM | POA: Diagnosis not present

## 2024-01-06 DIAGNOSIS — J8410 Pulmonary fibrosis, unspecified: Secondary | ICD-10-CM | POA: Diagnosis not present

## 2024-01-06 DIAGNOSIS — Z79899 Other long term (current) drug therapy: Secondary | ICD-10-CM | POA: Diagnosis not present

## 2024-01-06 DIAGNOSIS — R5383 Other fatigue: Secondary | ICD-10-CM | POA: Insufficient documentation

## 2024-01-06 DIAGNOSIS — R600 Localized edema: Secondary | ICD-10-CM | POA: Diagnosis not present

## 2024-01-06 DIAGNOSIS — J849 Interstitial pulmonary disease, unspecified: Secondary | ICD-10-CM

## 2024-01-06 DIAGNOSIS — E119 Type 2 diabetes mellitus without complications: Secondary | ICD-10-CM | POA: Insufficient documentation

## 2024-01-06 DIAGNOSIS — J449 Chronic obstructive pulmonary disease, unspecified: Secondary | ICD-10-CM | POA: Insufficient documentation

## 2024-01-06 DIAGNOSIS — R0602 Shortness of breath: Secondary | ICD-10-CM | POA: Diagnosis not present

## 2024-01-06 DIAGNOSIS — Z7984 Long term (current) use of oral hypoglycemic drugs: Secondary | ICD-10-CM | POA: Insufficient documentation

## 2024-01-06 DIAGNOSIS — E782 Mixed hyperlipidemia: Secondary | ICD-10-CM | POA: Diagnosis not present

## 2024-01-06 DIAGNOSIS — I272 Pulmonary hypertension, unspecified: Secondary | ICD-10-CM | POA: Insufficient documentation

## 2024-01-06 NOTE — Progress Notes (Signed)
 ReDS Vest / Clip - 01/06/24 1503       ReDS Vest / Clip   Station Marker B    Ruler Value 34    ReDS Value Range Low volume    ReDS Actual Value 33

## 2024-01-12 ENCOUNTER — Other Ambulatory Visit (HOSPITAL_COMMUNITY): Payer: Self-pay

## 2024-01-12 NOTE — Telephone Encounter (Signed)
 Prior authorization for Paul Johnston has been submitted and approved. Test billing returns $0 for 28 day supply.  [name] Effective: 01/07/2024 to 01/06/2025  Rachel DEL, CPhT Rx Patient Advocate Phone: [phone]

## 2024-01-19 ENCOUNTER — Other Ambulatory Visit (HOSPITAL_COMMUNITY): Payer: Self-pay

## 2024-01-20 DIAGNOSIS — H2513 Age-related nuclear cataract, bilateral: Secondary | ICD-10-CM | POA: Diagnosis not present

## 2024-01-20 DIAGNOSIS — E119 Type 2 diabetes mellitus without complications: Secondary | ICD-10-CM | POA: Diagnosis not present

## 2024-01-20 DIAGNOSIS — H40013 Open angle with borderline findings, low risk, bilateral: Secondary | ICD-10-CM | POA: Diagnosis not present

## 2024-01-20 DIAGNOSIS — H353131 Nonexudative age-related macular degeneration, bilateral, early dry stage: Secondary | ICD-10-CM | POA: Diagnosis not present

## 2024-01-20 LAB — OPHTHALMOLOGY REPORT-SCANNED

## 2024-01-21 ENCOUNTER — Telehealth: Payer: Self-pay

## 2024-01-21 NOTE — Telephone Encounter (Signed)
 Advanced Heart Failure Patient Advocate Encounter  Enrollment forms for Yutrepia faxed to [facility] on 01/21/2024. Forms attached to patient chart.  Rachel DEL, CPhT Rx Patient Advocate Phone: [phone]

## 2024-01-26 NOTE — Telephone Encounter (Signed)
 Spoke to representative who stated that Paul Johnston was mailed to a previous address [facility] had on file for patient. Paul Johnston contacted [facility] on 12/13 to confirm updated address. [facility] is shipping medication to correct address (same as enrollment forms) today 01/26/2024. Medication is being filled and patient is already in touch with nursing staff for start of care. Office should receive nurse visit notes once [facility] home nursing visit is complete.

## 2024-01-29 ENCOUNTER — Encounter: Payer: Self-pay | Admitting: Pulmonary Disease

## 2024-01-29 ENCOUNTER — Ambulatory Visit: Admitting: Pulmonary Disease

## 2024-01-29 VITALS — BP 122/76 | HR 91 | Temp 98.1°F | Ht 69.0 in | Wt 192.8 lb

## 2024-01-29 DIAGNOSIS — J9611 Chronic respiratory failure with hypoxia: Secondary | ICD-10-CM | POA: Diagnosis not present

## 2024-01-29 DIAGNOSIS — J439 Emphysema, unspecified: Secondary | ICD-10-CM

## 2024-01-29 DIAGNOSIS — G4733 Obstructive sleep apnea (adult) (pediatric): Secondary | ICD-10-CM

## 2024-01-29 DIAGNOSIS — I272 Pulmonary hypertension, unspecified: Secondary | ICD-10-CM

## 2024-01-29 DIAGNOSIS — J8410 Pulmonary fibrosis, unspecified: Secondary | ICD-10-CM

## 2024-01-29 NOTE — Patient Instructions (Addendum)
 VISIT SUMMARY:  You came in today for a follow-up visit to discuss your pulmonary conditions, including pulmonary hypertension, COPD, and severe obstructive sleep apnea. We reviewed your current medications and made some adjustments to better manage your symptoms.  YOUR PLAN:  -PULMONARY HYPERTENSION: Pulmonary hypertension is high blood pressure in the lungs' arteries. You recently switched from Tyvaso  to Yutrepia due to difficulties with inhalation(Treprostinil ). Continue taking Utrepia and titrate the dose as tolerated.  -PULMONARY FIBROSIS: Pulmonary fibrosis is a lung disease that occurs when lung tissue becomes damaged and scarred. This is part of your complex pulmonary condition.  -CHRONIC OBSTRUCTIVE PULMONARY DISEASE (COPD): COPD is a chronic inflammatory lung disease that obstructs airflow from the lungs. Continue using Trelegy daily and remember to rinse your mouth after each use to prevent oral side effects.  -SEVERE OBSTRUCTIVE SLEEP APNEA: Severe obstructive sleep apnea is a condition where your breathing repeatedly stops and starts during sleep. You have declined CPAP due to discomfort and side effects. If your symptoms persist, we may consider a home sleep study.  -CHRONIC HYPOXEMIC RESPIRATORY FAILURE ON OXYGEN  THERAPY: This condition means you have low levels of oxygen  in your blood and require oxygen  therapy. Continue using your oxygen  therapy as prescribed, even though you have concerns about running out of oxygen . Unfortunately, an E tank is not available through your current supplier.  INSTRUCTIONS:  Please continue with your current medications and oxygen  therapy as discussed. If your symptoms persist or worsen, especially regarding your sleep apnea, we may need to consider a home sleep study. Follow up with us  if you have any concerns or need further assistance.

## 2024-01-29 NOTE — Progress Notes (Signed)
 Subjective:    Patient ID: Paul Johnston, male    DOB: 09/06/1950, 73 y.o.   MRN: [id number]  Patient Care Team: Edman Marsa PARAS, DO as PCP - General (Family Medicine) Donette Ellouise LABOR, FNP as PCP - Advanced Heart Failure (Family Medicine) Mevelyn JONETTA Bathe, OD [facility])  Chief Complaint  Patient presents with   Medical Management of Chronic Issues    Shortness of breath on exertion. Dry cough.     BACKGROUND/INTERVAL:Patient is a very complex 73 year old former smoker with a history of pulmonary fibrosis previously followed by Dr. Praveen Mannam at our [facility] location.  Patient transferred care to our [facility] location first visit with me was on 04 June 2023.  At that time the patient was noted to have severe issues with hypoxemia.  He was started on oxygen  supplementation.  His last visit with me was on 27 November 2023.   HPI Discussed the use of AI scribe software for clinical note transcription with the patient, who gave verbal consent to proceed.  History of Present Illness   Paul Johnston is a 73 year old male with pulmonary fibrosis, COPD, and pulmonary hypertension who presents for follow-up.  He recently switched his medication for pulmonary hypertension from Tyvaso  to Yutrepia due to administration difficulties and concerns about efficacy. He started Yutrepia yesterday, taking two doses per day, and is currently titrating the dose, beginning at the lowest level.  He has severe obstructive sleep apnea but has declined CPAP use due to discomfort and side effects, including mucus accumulation causing sores on his upper lip. He experiences difficulty sleeping at night, often sleeping better sitting up, and reports increased daytime sleepiness.  He has a history of edema in his leg, which improved after switching from metformin  to Jardiance . The edema has significantly reduced, though his foot remains slightly swollen. He notes increased nocturnal urination since  starting Jardiance .  He continues to use Trelegy once daily without issues and ensures to rinse his mouth after use.  He mentions adjusting his oxygen  use, sometimes turning it down or off during car rides to conserve supply, and has inquired about obtaining an [employer] home fill E tank, which is not available through his current supplier.     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  DATA PFTs: 09/11/2021 FVC 2.03 [70%], FEV1 2.41 [76%], F/F 79, TLC 5.68 [83%], DLCO 13.43 [53%] Moderate diffusion defect 08/13/2022 FVC 2.87 [61%], FEV1 2.33 94%, F/F81, TLC 5.49 80%, DLCO 10.35 41% Moderate diffusion defect. 07/10/2023 FEV1 2.25 L or 72% predicted, FVC 2.68 L or 63% predicted, FEV1/FVC 84% moderate reduction in lung volumes diffusion capacity severely reduced.   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   Labs: CTD serologies 07/05/2021-positive only for double-stranded DNA Hepatic panel 03/25/2022-within normal limits Alpha 1 antitrypsin 06/13/2023  -phenotype MM level 203 mg/dL (normal)   Review of Systems A 10 point review of systems was performed and it is as noted above otherwise negative.   Patient Active Problem List   Diagnosis Date Noted   Interstitial lung disease (HCC) 08/13/2022   Diarrhea 03/08/2022   Gastroesophageal reflux disease 03/08/2022   Pulmonary fibrosis (HCC) 11/30/2021   Tinnitus, bilateral 01/27/2019   Non-melanoma skin cancer 09/15/2018   History of sleep apnea 09/15/2016   Type 2 diabetes mellitus with other specified complication (HCC)    Obesity (BMI 30.0-34.9) 12/09/2013   GERD (gastroesophageal reflux disease) 09/01/2012   Vitamin D  deficiency 09/01/2012   Solitary pulmonary nodule, history of, left side 09/01/2012   BPH (benign prostatic hyperplasia) 09/01/2012   Umbilical hernia 07/03/2012   Hyperlipidemia associated with type 2 diabetes mellitus (HCC) 12/14/2008   Tobacco use disorder in remission 12/14/2008    Social History   Tobacco Use   Smoking status: Former    Current packs/day: 0.00    Average packs/day: 1 pack/day for 33.0 years (33.0 ttl pk-yrs)    Types: Cigarettes    Start date: 09/07/1975    Quit date: 09/06/2008    Years since quitting: 15.4    Passive exposure: Past   Smokeless tobacco: Former  Substance Use Topics   Alcohol use: No    Allergies[1]  Active Medications[2]  Immunization History  Administered Date(s) Administered    sv, Bivalent, Protein Subunit Rsvpref,pf (Abrysvo) 11/26/2021   Fluad Quad(high Dose 65+) 12/08/2018, 12/09/2019, 11/28/2020, 11/30/2021   INFLUENZA, HIGH DOSE SEASONAL PF 12/12/2016, 12/17/2017, 11/26/2022, 11/27/2023   Influenza,inj,Quad PF,6+ Mos 11/18/2012, 12/21/2015   PFIZER Comirnaty(Gray Top)Covid-19 Tri-Sucrose Vaccine 06/29/2020   PFIZER(Purple Top)SARS-COV-2 Vaccination 03/20/2019, 04/10/2019, 11/18/2019   PNEUMOCOCCAL CONJUGATE-20 06/23/2023   Pfizer Covid-19 Vaccine Bivalent Booster 27yrs & up 11/17/2020   Pfizer Covid-19  Vaccine Bivalent Booster 5y-11y 11/26/2021   Pfizer(Comirnaty)Fall Seasonal Vaccine 12 years and older 11/26/2021, 11/26/2022, 12/07/2023   Pneumococcal Conjugate-13 12/02/2012   Pneumococcal Polysaccharide-23 06/11/2017   Respiratory Syncytial Virus Vaccine,Recomb Aduvanted(Arexvy) 11/26/2021   Td 11/20/2004, 12/21/2010   Tdap 12/21/2010, 11/26/2022        Objective:     Vitals:   01/29/24 1452  BP: 122/76  Pulse: 91  Temp: 98.1 F (36.7 C)  Height: 5' 9 (1.753 m)  Weight: 192 lb 12.8 oz (87.5 kg)  SpO2: 92%  Comment: 2L oxygen   TempSrc: Temporal  BMI (Calculated): 28.46    GENERAL: Obese gentleman, no acute distress, fully ambulatory, no conversational dyspnea. HEAD: Normocephalic, atraumatic.  EYES: Pupils equal, round, reactive to light.  No scleral icterus.  MOUTH: Dentition intact, Mallampati III airway, no thrush. NECK: Supple. No thyromegaly. Trachea midline. No JVD.  No adenopathy. PULMONARY: Good air entry bilaterally.  Faint crackles at bases, no other adventitious sounds.SABRA CARDIOVASCULAR: S1 and S2. Regular rate and rhythm.  Cards normal ABDOMEN: Obese, otherwise benign. MUSCULOSKELETAL: No joint deformity, no clubbing, no edema.  NEUROLOGIC: No overt focal deficit, no gait disturbance, speech is fluent. SKIN: Intact,warm,dry. PSYCH: Mood and behavior normal.   Assessment & Plan:     ICD-10-CM   1. 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 Chronic respiratory failure with hypoxia (HCC)  J96.11     3. Pulmonary HTN (HCC)  I27.20     4. OSA (obstructive sleep apnea)  G47.33      Discussion:    Pulmonary hypertension Managed with Olga, a switch from Tyvaso  due to difficulty in inhalation. Olga has smaller granules, potentially improving tolerance. Titration is ongoing, starting at the lowest level with plans to increase if well-tolerated. - Continue Yutrepia with titration as tolerated  Pulmonary  fibrosis Part of his complex pulmonary condition. - Continue Esbriet   Chronic obstructive pulmonary disease (COPD) COPD is managed with Trelegy, which he uses daily without issues. He rinses his mouth after use to prevent oral side effects. - Continue Trelegy daily  Severe obstructive sleep apnea He has declined CPAP due to discomfort and complications, including edema and mucus accumulation. Edema improved with Jardiance . Sleep apnea may affect pulmonary artery pressure and heart health.   - Retry CPAP once he has reached steady state titration would Yutrepia  Chronic hypoxemic respiratory failure on oxygen  therapy He uses oxygen  therapy but sometimes reduces or turns off the device, which is not ideal. He is concerned about running out of oxygen  and has inquired about home fill E tank, but it is not available through [employer]. - Continue oxygen  therapy as prescribed    Will see the patient in follow-up in 2 months time  Advised if symptoms do not improve or worsen, to please contact office for sooner follow up or seek emergency care.    I spent 35 minutes of dedicated to the care of this patient on the date of this encounter to include pre-visit review of records, face-to-face time with the patient discussing conditions above, post visit ordering of testing, clinical documentation with the electronic health record, making appropriate referrals as documented, and communicating necessary findings to members of the patients care team.     C. Leita Sanders, MD Advanced Bronchoscopy PCCM [facility]    *This note was generated using voice recognition software/Dragon and/or AI transcription program.  Despite best efforts to proofread, errors can occur which can change the meaning. Any transcriptional errors that result from this process are unintentional and may not be fully corrected at the time of dictation.    [1]  Allergies Allergen Reactions   Biaxin  [Clarithromycin] Other (See Comments)    GI upset  [2]  Current Meds  Medication Sig   albuterol  (VENTOLIN  HFA) 108 (90 Base) MCG/ACT inhaler Inhale 2 puffs into the lungs every 6 (six) hours as needed for wheezing or shortness of breath.   Cholecalciferol (VITAMIN D3) 50 MCG (2000 UT) TABS Take 2,000  Units by mouth daily.   ESBRIET  801 MG TABS Take 1 tablet (801 mg total) by mouth 3 (three) times daily with meals.   Fluticasone -Umeclidin-Vilant (TRELEGY ELLIPTA ) 100-62.5-25 MCG/ACT AEPB Inhale 1 puff into the lungs daily.   JARDIANCE  10 MG TABS tablet Take 1 tablet (10 mg total) by mouth daily.   Lancets (ONETOUCH ULTRASOFT) lancets Use as instructed   omeprazole  (PRILOSEC) 40 MG capsule Take 1 capsule (40 mg total) by mouth daily before breakfast.   ONETOUCH VERIO test strip Check blood sugar up to 2 times daily as instructed   rosuvastatin  (CRESTOR ) 20 MG tablet Take 1 tablet (20 mg total) by mouth daily.   YUTREPIA 26.5 MCG CAPS Take 26.5 mcg by mouth in the morning, at noon, in the evening, and at bedtime.   [DISCONTINUED] Treprostinil  (TYVASO  DPI INSTITUTIONAL KIT) 16 MCG POWD Inhale 16 mcg into the lungs in the morning, at noon, in the evening, and at bedtime.

## 2024-02-11 ENCOUNTER — Telehealth: Payer: Self-pay | Admitting: Family

## 2024-02-11 NOTE — Telephone Encounter (Signed)
 Called to confirm/remind patient of their appointment at the [facility] on 02/13/24.   Appointment:   [x] Confirmed  [] Left mess   [] No answer/No voice mail  [] VM Full/unable to leave message  [] Phone not in service  Patient reminded to bring all medications and/or complete list.  Confirmed patient has transportation. Gave directions, instructed to utilize valet parking.

## 2024-02-12 NOTE — Progress Notes (Unsigned)
 "  ADVANCED HEART FAILURE FOLLOW UP CLINIC NOTE  Referring Physician: Edman Marsa PARAS, DO  Primary Care: Edman Marsa PARAS, DO Primary Cardiologist:  Chief complaint: shortness of breath   HPI: Paul Johnston is a 74 y.o. male who presents for follow up of pulmonary hypertension.   Patient has a longstanding history of pulmonary fibrosis and COPD, has been following with pulmonary for some time.  He reports that he had been doing fairly well, until he was diagnosed with pneumonia 02/25 and has since seen a precipitous drop in his functional status.  He was referred to advanced heart failure clinic given concerning findings for pulmonary hypertension on both echocardiogram and CT.  Underwent right heart catheterization 08/28/23 showing severely elevated pulmonary arterial pressures, severely reduced cardiac output.   SUBJECTIVE:  He presents, with his grandson, today for a HF follow-up visit with a chief complaint of moderate shortness of breath with little exertion. Has associated fatigue, occasional dizziness, decreased appetite (nothing tastes right). Denies any chest pain, palpitations, edema. He wants to try and improve his quality of life. He feels like his SOB has worsened since he was changed to yutrepia at a reduced dose. It is currently being titrated up by pulmonology. He had a lot of coughing issues with Tyvaso  and was unable to get nebulizer for nebulized tyvaso .   Concerned that his oxygen  concentrator may have something wrong after his power went out. Gets better results with oxygen  tank. Not sleeping well and has had difficulty using CPAP. Does tend to sleep / nap some during the day. Drinking zero sugar mtn dew/ pepsi, little water.   We will attempt to modify his cardiac output is much as we can but there are limited therapies for his already advanced lung disease. Do not believe that he is quite ready for hospice, but if he has significant worsening he may  benefit from palliative care referral.  PMH, current medications, allergies, social history, and family history reviewed in epic.  ROS: All systems negative except what is listed in HPI, PMH and Problem List  PHYSICAL EXAM:  General: Ill appearing elderly male wearing oxygen  sitting in wheelchair.  Cor: No JVD. Regular rhythm, rate.  Lungs: clear Abdomen: soft, nontender, nondistended. Extremities: no edema Neuro:. Affect pleasant  Vitals:   02/13/24 1416  BP: (!) 164/102  Pulse: 87  SpO2: 90%  Weight: 195 lb 6.4 oz (88.6 kg)   Wt Readings from Last 3 Encounters:  02/13/24 195 lb 6.4 oz (88.6 kg)  01/29/24 192 lb 12.8 oz (87.5 kg)  01/06/24 193 lb 2 oz (87.6 kg)   Lab Results  Component Value Date   CREATININE 1.15 12/08/2023   CREATININE 0.96 08/28/2023   CREATININE 1.00 06/13/2023     DATA REVIEW  ECG: None since 2019   ECHO: 03/2023: LVEF 50 to 55%, mildly reduced RV systolic function with moderate enlargement, moderately elevated PA systolic pressure.   CATH: 08/2023: RA 14, PA 92/45 (61), PCWP 14, TD CO/CI 3.5/1.67, PVR 13.4 wood units    ASSESSMENT & PLAN:  pulmonary hypertension: Patient with known idiopathic pulmonary fibrosis and recent worsening of his functional status, on chronic home oxygen .  WHO FC IV, RHC concerning with elevated RA pressures, severely reduced CI. Primarily group III.  - Was unable to transition to nebulized Tyvaso  for unknown reasons. Has severe coughing issues with inhaled Tyvaso  where he's coughing the inhaler out of his mouth - currently using Yutrepia and being titrated up by pulmonology. He  thinks he was started on too low of a dose as he says that he felt better when using Tyvaso .  - Would titrate to maximum and then can consider starting low-dose PDE 5 given severely elevated PA pressures and advanced symptoms - Would benefit from pulmonary rehab when able - Encouraged to wear his CPAP as much as possible while sleeping -  Continue management of IPF as below - No blood pressure medications - wearing oxygen  at 2L at home but 1L on his portable - weight up 3 pounds from last visit here 1 month ago   IPF: Follows with pulmonary, last DLCO 41%.  - Continue Pirfenidone  801mg  TID - Inhaler therapy per pulmonary   HLD:  - Continue crestor  20mg  daily - LDL 12/08/23 was 50   DM2:  - tolerating Jardiance  without known side effects - Reports pedal edema improving since starting jardiance  - BMET 12/08/23 reviewed: sodium 141, potassium 5.0, creatinine 1.15, GFR 68  - will message PCP and ask him to check BMET at his upcoming visit  HTN: - BP elevated today at 164/102 but is upset about an issue with his mychart and not being able to message his correct providers - BP at pulmonology office a few weeks ago was normal at 122/76. Monitor BP at home.    Return in 1 month, sooner if needed.   I spent 35 minutes reviewing records, interviewing/ examing patient and managing plan/ orders.   Ellouise DELENA Class FNP-C 02/12/2024 "

## 2024-02-13 ENCOUNTER — Encounter: Payer: Self-pay | Admitting: Family

## 2024-02-13 ENCOUNTER — Ambulatory Visit: Attending: Family | Admitting: Family

## 2024-02-13 VITALS — BP 164/102 | HR 87 | Wt 195.4 lb

## 2024-02-13 DIAGNOSIS — Z7984 Long term (current) use of oral hypoglycemic drugs: Secondary | ICD-10-CM | POA: Diagnosis not present

## 2024-02-13 DIAGNOSIS — E785 Hyperlipidemia, unspecified: Secondary | ICD-10-CM | POA: Diagnosis not present

## 2024-02-13 DIAGNOSIS — J449 Chronic obstructive pulmonary disease, unspecified: Secondary | ICD-10-CM | POA: Diagnosis not present

## 2024-02-13 DIAGNOSIS — Z9981 Dependence on supplemental oxygen: Secondary | ICD-10-CM | POA: Diagnosis not present

## 2024-02-13 DIAGNOSIS — I272 Pulmonary hypertension, unspecified: Secondary | ICD-10-CM | POA: Diagnosis not present

## 2024-02-13 DIAGNOSIS — E119 Type 2 diabetes mellitus without complications: Secondary | ICD-10-CM | POA: Insufficient documentation

## 2024-02-13 DIAGNOSIS — J849 Interstitial pulmonary disease, unspecified: Secondary | ICD-10-CM

## 2024-02-13 DIAGNOSIS — E782 Mixed hyperlipidemia: Secondary | ICD-10-CM | POA: Diagnosis not present

## 2024-02-13 DIAGNOSIS — Z79899 Other long term (current) drug therapy: Secondary | ICD-10-CM | POA: Diagnosis not present

## 2024-02-13 DIAGNOSIS — I10 Essential (primary) hypertension: Secondary | ICD-10-CM | POA: Insufficient documentation

## 2024-02-13 DIAGNOSIS — J84112 Idiopathic pulmonary fibrosis: Secondary | ICD-10-CM | POA: Diagnosis not present

## 2024-02-27 IMAGING — CT CT CHEST HIGH RESOLUTION
2 of 7 series · 14 of 36 positions shown, 17 images · non-contrast
Comparison: 05/14/2021 and 04/02/2005.

CLINICAL DATA: Interstitial lung disease.



[Series 5: high resolution retro · axial · 0.79mm/px · z∈[-203,+73]mm · 11 of 328 slices shown, 14 images]
[im 26/328  mediastinal]
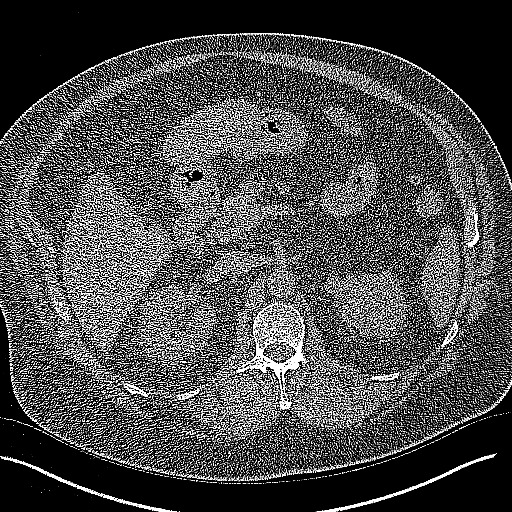
[im 26/328  lung]
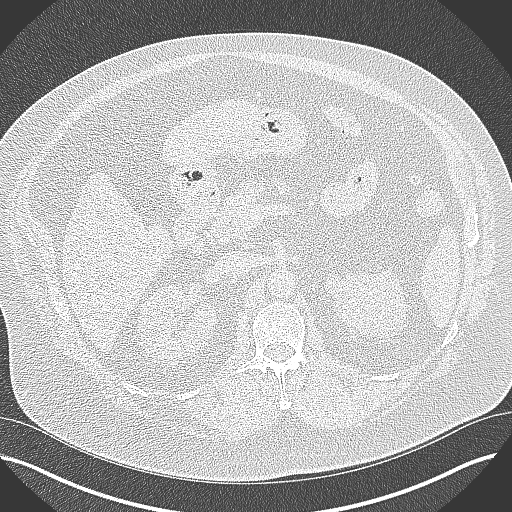
[im 51/328  lung]
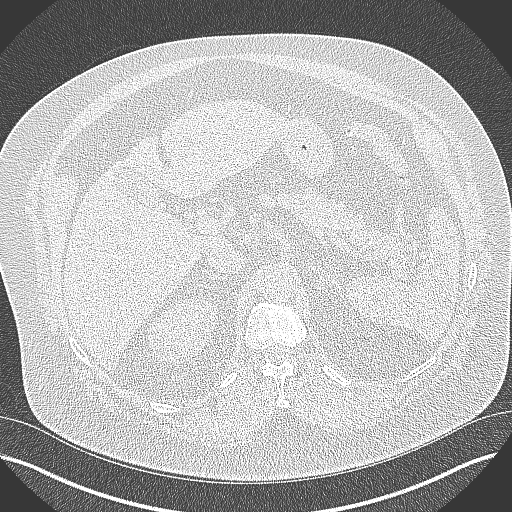
[im 76/328  lung]
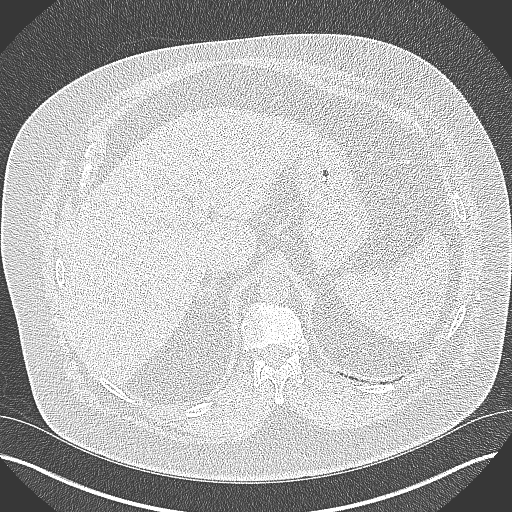
[im 101/328  lung]
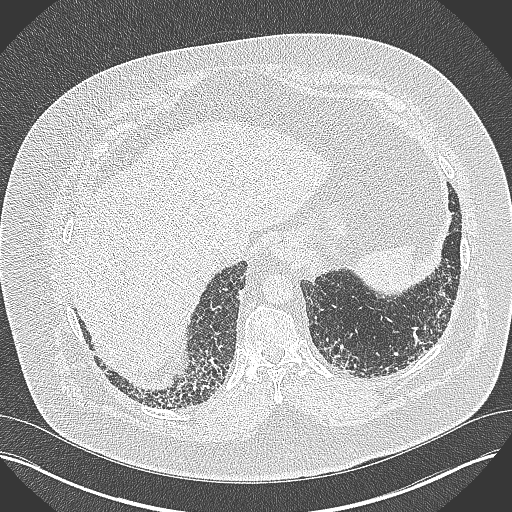
[im 126/328  mediastinal]
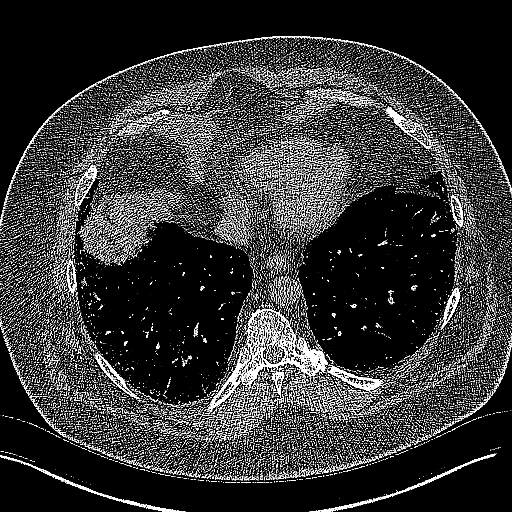
[im 126/328  lung]
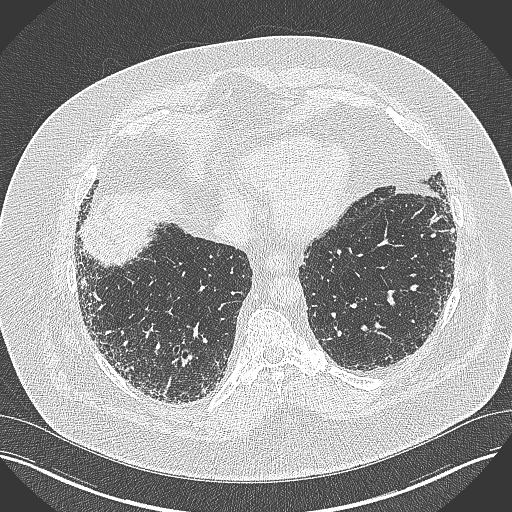
[im 177/328  lung]
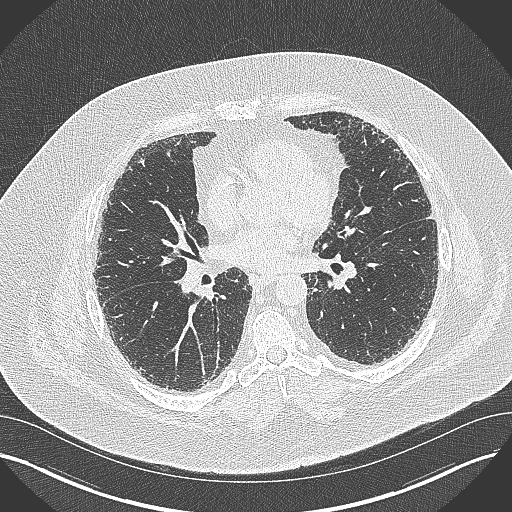
[im 202/328  lung]
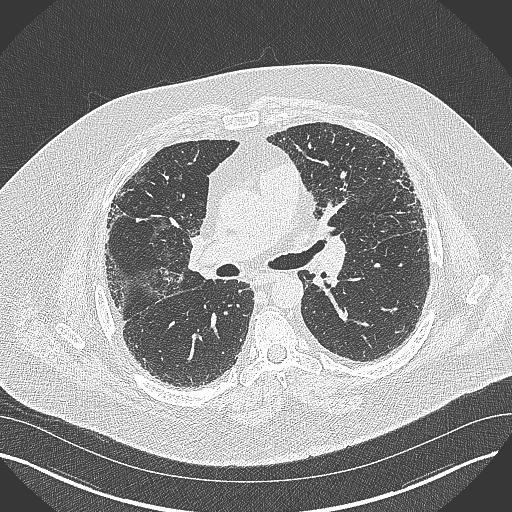
[im 227/328  lung]
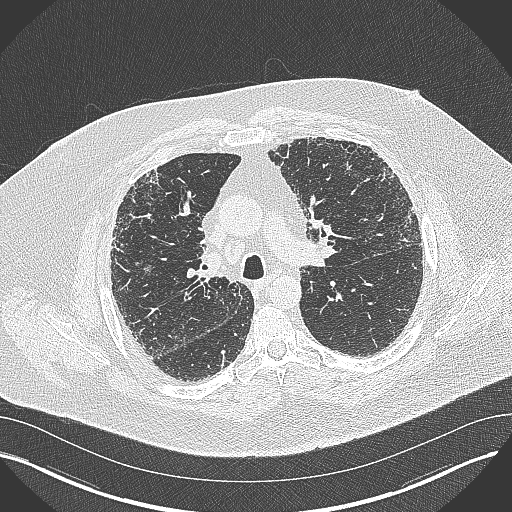
[im 252/328  mediastinal]
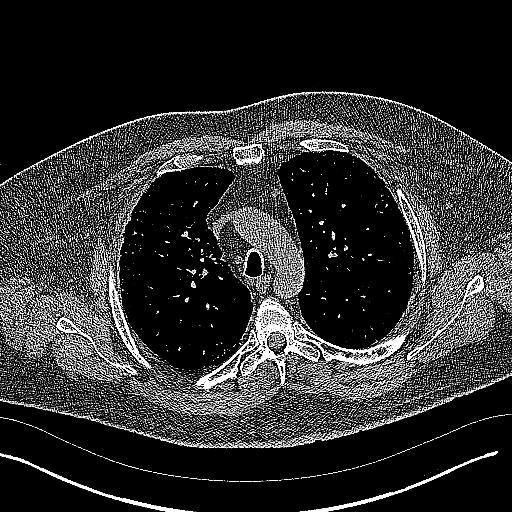
[im 252/328  lung]
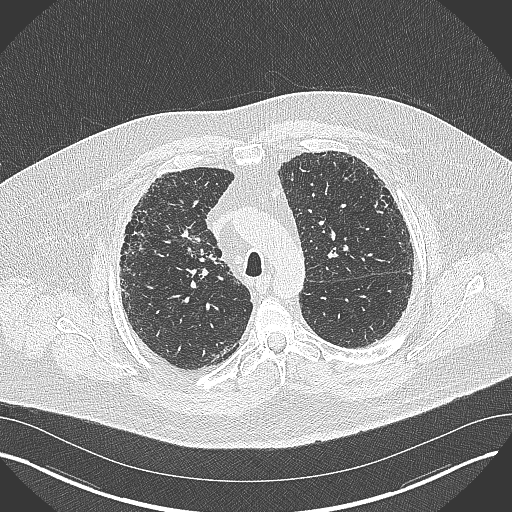
[im 277/328  lung]
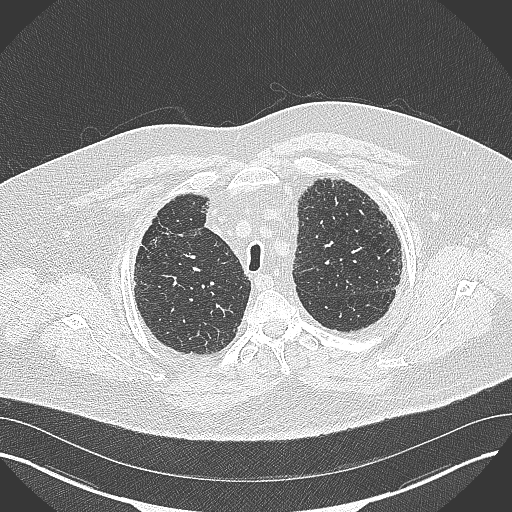
[im 302/328  lung]
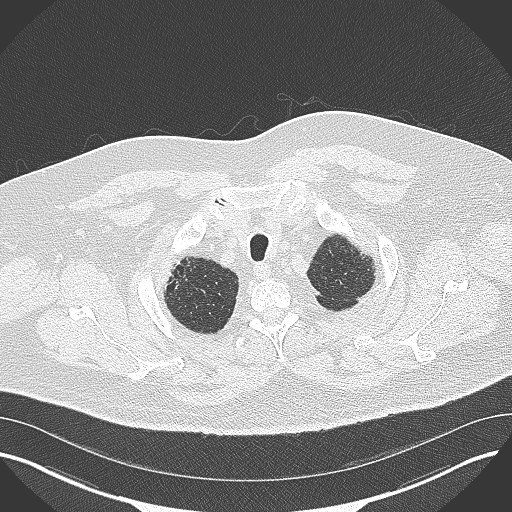

[Series 8: coronal · coronal · 0.64mm/px · 3 of 119 slices shown]
[im 24/119  lung]
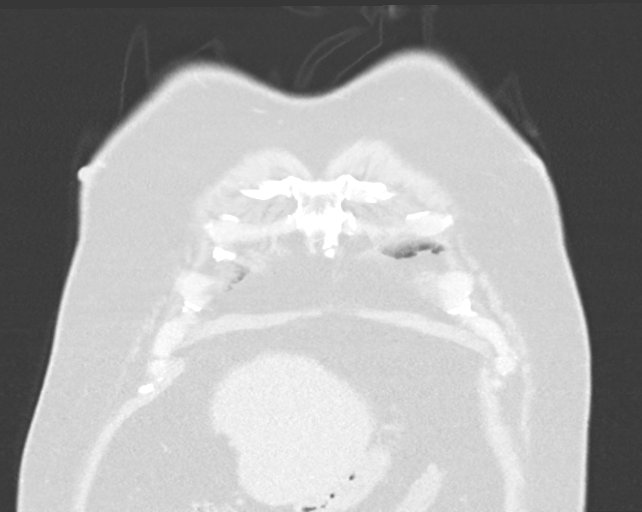
[im 48/119  lung]
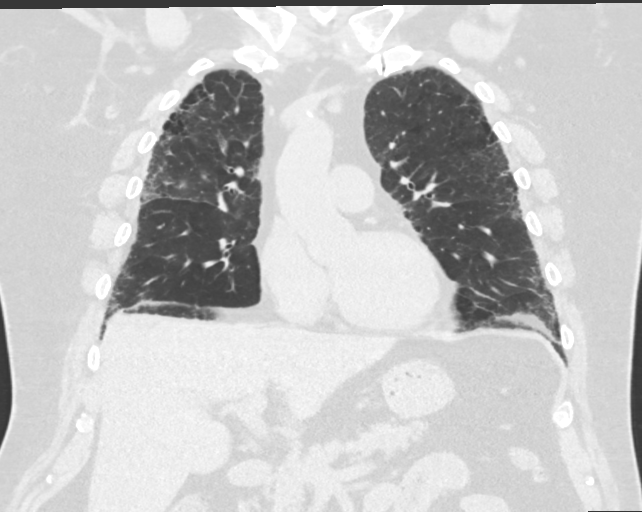
[im 71/119  lung]
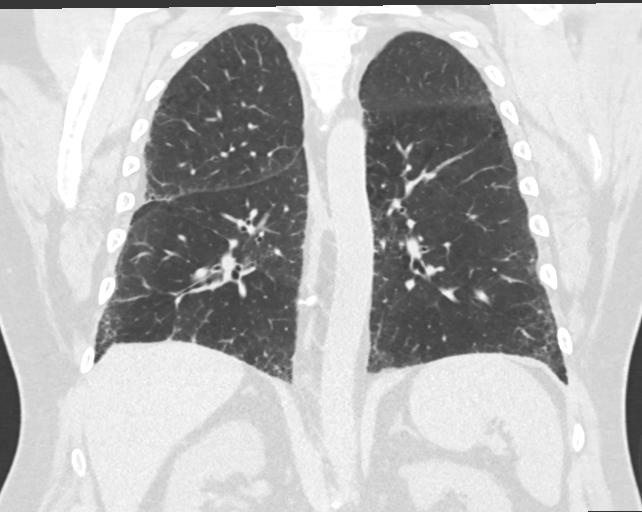

[14 of 36 positions shown; findings below may reference images not displayed]

FINDINGS: Cardiovascular: Atherosclerotic calcification of the aorta and
coronary arteries. Heart size normal. No pericardial effusion.

Mediastinum/Nodes: Mediastinal lymph nodes measure up to 1.4 cm in
the low right paratracheal station, similar to 04/02/2005. Hilar
regions are difficult to evaluate without IV contrast. No axillary
adenopathy. Esophagus is grossly unremarkable.

Lungs/Pleura: Centrilobular and paraseptal emphysema. Superimposed
peripheral and basilar subpleural reticulation, ground-glass and
traction bronchiectasis/bronchiolectasis, as on 05/14/2021. Findings
have progressed from 04/02/2005. 8 mm subpleural lymph node along
the right hemidiaphragm (3/108), as on 04/02/2005 and benign. 3 mm
subpleural lateral left lower lobe nodule (3/103), as on 04/02/2005,
also benign. Somewhat spiculated appearing perihilar nodule in the
medial left lower lobe measures 8 mm (3/81), new from 04/02/2005. 6
mm nodule in the left upper lobe (3/65), present on 04/02/2005. No
pleural fluid. Airway is unremarkable. No air trapping.

Upper Abdomen: Mild marginal irregularity of the liver. Visualized
portions of the liver, gallbladder, adrenal glands and right kidney
are otherwise unremarkable. 1.4 cm exophytic lesion off the upper
pole left kidney, present dating back to 04/02/2005. No specific
follow-up necessary. Visualized portions of the spleen, pancreas,
stomach and bowel are otherwise unremarkable. No upper abdominal
adenopathy.

Musculoskeletal: Degenerative changes in the spine. No worrisome
lytic or sclerotic lesions.
IMPRESSION: 1. Pulmonary parenchymal pattern of fibrosis, as detailed above, has
progressed from 04/02/2005. Findings are categorized as probable UIP
per consensus guidelines: Diagnosis of Idiopathic Pulmonary
Fibrosis: An Official ATS/ERS/JRS/ALAT Clinical Practice Guideline.
Am J Respir Crit Care Med Vol 198, Soo Chul 5, ppe22-[id number][DATE].
2. Slightly spiculated appearing 8 mm medial left lower lobe nodule,
new from 04/02/2005. Adenocarcinoma cannot be excluded. Nodule size
is borderline for PET resolution. Consider follow-up CT chest
without contrast in 3 months, as clinically indicated. These results
will be called to the ordering clinician or representative by the
Radiologist Assistant, and communication documented in the PACS or
[REDACTED].
3. Liver margin appears slightly irregular, raising suspicion for
cirrhosis.
4. Aortic atherosclerosis ([id number]-[id number].Z). Coronary artery
calcification.
5.  Emphysema ([id number].K).

## 2024-03-04 ENCOUNTER — Encounter: Payer: Self-pay | Admitting: Pulmonary Disease

## 2024-03-04 DIAGNOSIS — J849 Interstitial pulmonary disease, unspecified: Secondary | ICD-10-CM

## 2024-03-05 ENCOUNTER — Encounter: Payer: Self-pay | Admitting: Pulmonary Disease

## 2024-03-05 DIAGNOSIS — J9611 Chronic respiratory failure with hypoxia: Secondary | ICD-10-CM

## 2024-03-05 DIAGNOSIS — J449 Chronic obstructive pulmonary disease, unspecified: Secondary | ICD-10-CM

## 2024-03-05 DIAGNOSIS — J439 Emphysema, unspecified: Secondary | ICD-10-CM

## 2024-03-05 DIAGNOSIS — J849 Interstitial pulmonary disease, unspecified: Secondary | ICD-10-CM

## 2024-03-05 MED ORDER — ESBRIET 801 MG PO TABS: 801.0000 mg | ORAL_TABLET | Freq: Three times a day (TID) | ORAL | 3 refills | Status: AC

## 2024-03-05 MED ORDER — TRELEGY ELLIPTA 100-62.5-25 MCG/ACT IN AEPB: 1.0000 | INHALATION_SPRAY | Freq: Every day | RESPIRATORY_TRACT | 3 refills | Status: AC

## 2024-03-09 ENCOUNTER — Encounter: Payer: Self-pay | Admitting: Family Medicine

## 2024-03-10 ENCOUNTER — Encounter: Payer: Self-pay | Admitting: Family Medicine

## 2024-03-10 ENCOUNTER — Ambulatory Visit: Admitting: Family Medicine

## 2024-03-10 VITALS — BP 124/78 | HR 81 | Ht 69.0 in | Wt 197.4 lb

## 2024-03-10 DIAGNOSIS — J849 Interstitial pulmonary disease, unspecified: Secondary | ICD-10-CM | POA: Diagnosis not present

## 2024-03-10 DIAGNOSIS — Z7984 Long term (current) use of oral hypoglycemic drugs: Secondary | ICD-10-CM

## 2024-03-10 DIAGNOSIS — I272 Pulmonary hypertension, unspecified: Secondary | ICD-10-CM | POA: Diagnosis not present

## 2024-03-10 DIAGNOSIS — R809 Proteinuria, unspecified: Secondary | ICD-10-CM

## 2024-03-10 DIAGNOSIS — R300 Dysuria: Secondary | ICD-10-CM

## 2024-03-10 DIAGNOSIS — J8410 Pulmonary fibrosis, unspecified: Secondary | ICD-10-CM

## 2024-03-10 DIAGNOSIS — R17 Unspecified jaundice: Secondary | ICD-10-CM

## 2024-03-10 DIAGNOSIS — D696 Thrombocytopenia, unspecified: Secondary | ICD-10-CM

## 2024-03-10 DIAGNOSIS — J9611 Chronic respiratory failure with hypoxia: Secondary | ICD-10-CM

## 2024-03-10 DIAGNOSIS — K219 Gastro-esophageal reflux disease without esophagitis: Secondary | ICD-10-CM

## 2024-03-10 DIAGNOSIS — E1169 Type 2 diabetes mellitus with other specified complication: Secondary | ICD-10-CM | POA: Diagnosis not present

## 2024-03-10 DIAGNOSIS — J439 Emphysema, unspecified: Secondary | ICD-10-CM | POA: Diagnosis not present

## 2024-03-10 DIAGNOSIS — E785 Hyperlipidemia, unspecified: Secondary | ICD-10-CM

## 2024-03-10 LAB — POCT GLYCOSYLATED HEMOGLOBIN (HGB A1C): Hemoglobin A1C: 6.4 % — AB (ref 4.0–5.6)

## 2024-03-10 MED ORDER — OMEPRAZOLE 40 MG PO CPDR: 40.0000 mg | DELAYED_RELEASE_CAPSULE | Freq: Every day | ORAL | 3 refills | Status: AC

## 2024-03-10 NOTE — Patient Instructions (Addendum)
 Thank you for coming to the office today.  Recent Labs    06/13/23 1449 12/08/23 1100 03/10/24 1525  HGBA1C 6.6* 6.2* 6.4*   Keep Jardiance  10mg  daily for now, can consider dose inc if the sugar goes higher or swelling is less controlled  BLood and urine today evaluation including chemistry BMET and the bilirubin as well.   For Constipation (less frequent bowel movement that can be hard dry or involve straining).  Recommend trying OTC Miralax 17g = 1 capful in large glass water once daily for now, try several days to see if working, goal is soft stool or BM 1-2 times daily, if too loose then reduce dose or try every other day. If not effective may need to increase it to 2 doses at once in AM or may do 1 in morning and 1 in afternoon/evening  - This medicine is very safe and can be used often without any problem and will not make you dehydrated. It is good for use on AS NEEDED BASIS or even MAINTENANCE therapy for longer term for several days to weeks at a time to help regulate bowel movements  Other more natural remedies or preventative treatment: - Increase hydration with water - Increase fiber in diet (high fiber foods = vegetables, leafy greens, oats/grains) - May take OTC Fiber supplement (metamucil powder or pill/gummy) - May try OTC Probiotic   Please schedule a Follow-up Appointment to: Return in about 6 months (around 09/07/2024) for 6 month DM A1c, Heart/Pulm, updates.  If you have any other questions or concerns, please feel free to call the office or send a message through MyChart. You may also schedule an earlier appointment if necessary.  Additionally, you may be receiving a survey about your experience at our office within a few days to 1 week by e-mail or mail. We value your feedback.  Marsa Officer, DO [facility], [place]

## 2024-03-10 NOTE — Progress Notes (Unsigned)
 "  Subjective:    Patient ID: Paul Johnston, male    DOB: Sep 14, 1950, 74 y.o.   MRN: [id number]  Christophor Eick is a 74 y.o. 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 CHF Dyspnea and respiratory symptoms On Supplemental continuous oxygen  - Shortness of breath limiting ability to attend appointments - Congestion and wheezing present - Difficulty using CPAP due to discomfort and congestion - Impaired sleep quality secondary to CPAP intolerance - On Esbriet  and Tyvaso    Appetite and taste disturbance - Decreased appetite - Altered taste sensation, likened to effects of chemotherapy experienced by his mother   Type 2 Diabetes - Current therapy with metformin  A1c 6.2 controlled - Previously discussed switching to Jardiance  for heart failure benefit - Concerned about cost difference between metformin  and Jardiance    Blood pressure abnormalities - History of low blood pressure readings - Recent blood pressure measured at 88/50 mmHg - Losartan  discontinued due to hypotension   Renal findings - Recent laboratory results with elevated microalbumin - Normal kidney function on recent blood tests   Tobacco use history - Former smoker, quit 15 years ago       Hyperlipidemia On Statin Rosuvastatin , LDL 50   Vitamin D  Deficiency Continue on D3 supplement  Health Maintenance: ***     12/17/2023   12:06 AM 12/16/2023    2:31 PM 04/29/2023    2:13 PM  Depression screen PHQ 2/9  Decreased Interest 0 0 1  Down, Depressed, Hopeless 1 1 0   PHQ - 2 Score 1 1 1   Altered sleeping 1  1  Tired, decreased energy 1  1  Change in appetite 0  0  Feeling bad or failure about yourself  0  0  Trouble concentrating 0  0  Moving slowly or fidgety/restless 0  0  Suicidal thoughts 0  0  PHQ-9 Score 3   3   Difficult doing work/chores Not difficult at all  Not difficult at all     Data saved with a previous flowsheet row definition       12/16/2023    2:31 PM 04/29/2023    2:13 PM 12/13/2022    3:10 PM 06/05/2022    3:44 PM  GAD 7 : Generalized Anxiety Score  Nervous, Anxious, on Edge 0  0  0  0   Control/stop worrying 0  0  0  0   Worry too much - different things 1  0  0  0   Trouble relaxing 0  0  0  0   Restless 0  0  0  0   Easily annoyed or irritable 0  0  0  0   Afraid - awful might happen 0  0  0  0   Total GAD 7 Score 1 0 0 0     Data saved with a previous flowsheet row definition    Social History[1]  Review of Systems Per HPI unless specifically indicated above     Objective:    BP 124/78 (BP Location: Left Arm, Patient Position:  Sitting, Cuff Size: Normal)   Pulse 81   Ht 5' 9 (1.753 m)   Wt 197 lb 6 oz (89.5 kg)   BMI 29.15 kg/m   Wt Readings from Last 3 Encounters:  03/10/24 197 lb 6 oz (89.5 kg)  02/13/24 195 lb 6.4 oz (88.6 kg)  01/29/24 192 lb 12.8 oz (87.5 kg)    Physical Exam  Results for orders placed or performed in visit on 03/10/24  POCT HgB A1C   Collection Time: 03/10/24  3:25 PM  Result Value Ref Range   Hemoglobin A1C 6.4 (A) 4.0 - 5.6 %   HbA1c POC (<> result, manual entry)     HbA1c, POC (prediabetic range)     HbA1c, POC (controlled diabetic range)        Assessment & Plan:   Problem List Items Addressed This Visit     Type 2 diabetes mellitus with other specified complication (HCC) - Primary   Relevant Orders   POCT HgB A1C (Completed)     ***  Orders Placed This Encounter  Procedures   POCT HgB A1C    No orders of the defined types were placed in this  encounter.   Follow up plan: No follow-ups on file.  Future labs ordered for ***   Marsa Officer, DO [facility] 03/10/2024, 3:37 PM    [1]  Social History Tobacco Use   Smoking status: Former    Current packs/day: 0.00    Average packs/day: 1 pack/day for 33.0 years (33.0 ttl pk-yrs)    Types: Cigarettes    Start date: 09/07/1975    Quit date: 09/06/2008    Years since quitting: 15.5    Passive exposure: Past   Smokeless tobacco: Former  Building Services Engineer status: Never Used  Substance Use Topics   Alcohol use: No   Drug use: No   "

## 2024-03-11 LAB — URINE CULTURE
MICRO NUMBER:: 17521748
Result:: NO GROWTH
SPECIMEN QUALITY:: ADEQUATE

## 2024-03-11 LAB — URINALYSIS, ROUTINE W REFLEX MICROSCOPIC
Bilirubin Urine: NEGATIVE
Hgb urine dipstick: NEGATIVE
Ketones, ur: NEGATIVE
Leukocytes,Ua: NEGATIVE
Nitrite: NEGATIVE
Protein, ur: NEGATIVE
Specific Gravity, Urine: 1.008 (ref 1.001–1.035)
pH: <=5 — AB (ref 5.0–8.0)

## 2024-03-11 LAB — BASIC METABOLIC PANEL WITH GFR
BUN/Creatinine Ratio: 23 (calc) — ABNORMAL HIGH (ref 6–22)
BUN: 26 mg/dL — ABNORMAL HIGH (ref 7–25)
CO2: 25 mmol/L (ref 20–32)
Calcium: 10.3 mg/dL (ref 8.6–10.3)
Chloride: 102 mmol/L (ref 98–110)
Creat: 1.12 mg/dL (ref 0.70–1.28)
Glucose, Bld: 122 mg/dL (ref 65–139)
Potassium: 5.6 mmol/L — ABNORMAL HIGH (ref 3.5–5.3)
Sodium: 143 mmol/L (ref 135–146)
eGFR: 70 mL/min/{1.73_m2}

## 2024-03-11 LAB — CBC WITH DIFFERENTIAL/PLATELET
Absolute Lymphocytes: 2050 {cells}/uL (ref 850–3900)
Absolute Monocytes: 683 {cells}/uL (ref 200–950)
Basophils Absolute: 56 {cells}/uL (ref 0–200)
Basophils Relative: 0.5 %
Eosinophils Absolute: 112 {cells}/uL (ref 15–500)
Eosinophils Relative: 1 %
HCT: 53.9 % — ABNORMAL HIGH (ref 39.4–51.1)
Hemoglobin: 17.2 g/dL — ABNORMAL HIGH (ref 13.2–17.1)
MCH: 27.7 pg (ref 27.0–33.0)
MCHC: 31.9 g/dL (ref 31.6–35.4)
MCV: 86.8 fL (ref 81.4–101.7)
MPV: 11.8 fL (ref 7.5–12.5)
Monocytes Relative: 6.1 %
Neutro Abs: 8299 {cells}/uL — ABNORMAL HIGH (ref 1500–7800)
Neutrophils Relative %: 74.1 %
Platelets: 158 10*3/uL (ref 140–400)
RBC: 6.21 Million/uL — ABNORMAL HIGH (ref 4.20–5.80)
RDW: 14.8 % (ref 11.0–15.0)
Total Lymphocyte: 18.3 %
WBC: 11.2 10*3/uL — ABNORMAL HIGH (ref 3.8–10.8)

## 2024-03-11 LAB — BILIRUBIN, FRACTIONATED(TOT/DIR/INDIR)
Bilirubin, Direct: 0.5 mg/dL — ABNORMAL HIGH (ref 0.0–0.2)
Indirect Bilirubin: 0.5 mg/dL (ref 0.2–1.2)
Total Bilirubin: 1 mg/dL (ref 0.2–1.2)

## 2024-03-11 NOTE — Progress Notes (Incomplete)
 "  Subjective:    Patient ID: Paul Johnston, male    DOB: 19-Apr-1950, 74 y.o.   MRN: [id number]  Paul Johnston is a 74 y.o. male presenting on 03/10/2024 for Medical Management of Chronic Issues   HPI  Discussed the use of AI scribe software for clinical note transcription with the patient, who gave verbal consent to proceed.  History of Present Illness   Paul Johnston is a 74 year old male with pulmonary hypertension and diabetes who presents with blood in the urine.  Hematuria - Blood in urine initially identified by cardiologists - Urine has a distinct odor and appears bubbly - Attributes urine changes to Jardiance , which is known to cause glucosuria - Urine microalbumin test performed in October  Chronic constipation - Chronic constipation managed with over-the-counter medications, including a product similar to Miralax - Concerned about long-term use of these medications and seeks advice on a safe regimen  Peripheral cyanosis - Darkening of foot, questioning possible relation to pulmonary hypertension  Dyspnea and congestion - Difficulty with mobility due to breathing issues, worsened since pneumonia episode in February of previous year - Requires supplemental oxygen  - Significant congestion managed with Vicks vapor rub - Avoids nasal sprays due to prior adverse reactions  Diabetes mellitus management - Current medications include Jardiance  10 mg daily - Blood glucose well-controlled, with recent A1c readings around 6.4       Future telemedicine if possible  ***A1c 6.4 Eye Dr 11/4  *** Difficulty with   Peripheral edema - Bilateral lower extremity swelling, right greater than left - Pitting edema extends to mid-leg and dorsum of both feet - Swelling described as 'aggravating' due to fluid accumulation - Difficulty putting on shoes - No associated pain   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 CHF Dyspnea and respiratory  symptoms On Supplemental continuous oxygen  - Shortness of breath limiting ability to attend appointments - Congestion and wheezing present - Difficulty using CPAP due to discomfort and congestion - Impaired sleep quality secondary to CPAP intolerance - On Esbriet  and Tyvaso    Appetite and taste disturbance - Decreased appetite - Altered taste sensation, likened to effects of chemotherapy experienced by his mother   Type 2 Diabetes - Current therapy with metformin  A1c 6.2 controlled - Previously discussed switching to Jardiance  for heart failure benefit - Concerned about cost difference between metformin  and Jardiance    Blood pressure abnormalities - History of low blood pressure readings - Recent blood pressure measured at 88/50 mmHg - Losartan  discontinued due to hypotension   Renal findings - Recent laboratory results with elevated microalbumin - Normal kidney function on recent blood tests   Tobacco use history - Former smoker, quit 15 years ago       Hyperlipidemia On Statin Rosuvastatin , LDL 50   Vitamin D  Deficiency Continue on D3 supplement  Health Maintenance: ***     12/17/2023   12:06 AM 12/16/2023    2:31 PM 04/29/2023    2:13 PM  Depression screen PHQ 2/9  Decreased Interest 0 0 1  Down, Depressed, Hopeless 1 1 0  PHQ - 2 Score 1 1 1   Altered sleeping 1  1  Tired, decreased energy 1  1  Change in appetite 0  0  Feeling bad or failure about yourself  0  0  Trouble concentrating 0  0  Moving slowly or fidgety/restless 0  0  Suicidal thoughts 0  0  PHQ-9 Score 3   3  Difficult doing work/chores Not difficult at all  Not difficult at all     Data saved with a previous flowsheet row definition       12/16/2023    2:31 PM 04/29/2023    2:13 PM 12/13/2022    3:10 PM 06/05/2022    3:44 PM  GAD 7 : Generalized Anxiety Score  Nervous, Anxious, on Edge 0  0  0  0   Control/stop worrying 0  0  0  0   Worry too much - different things 1  0  0  0    Trouble relaxing 0  0  0  0   Restless 0  0  0  0   Easily annoyed or irritable 0  0  0  0   Afraid - awful might happen 0  0  0  0   Total GAD 7 Score 1 0 0 0     Data saved with a previous flowsheet row definition    Social History[1]  Review of Systems Per HPI unless specifically indicated above     Objective:    BP 124/78 (BP Location: Left Arm, Patient Position: Sitting, Cuff Size: Normal)   Pulse 81   Ht 5' 9 (1.753 m)   Wt 197 lb 6 oz (89.5 kg)   BMI 29.15 kg/m   Wt Readings from Last 3 Encounters:  03/10/24 197 lb 6 oz (89.5 kg)  02/13/24 195 lb 6.4 oz (88.6 kg)  01/29/24 192 lb 12.8 oz (87.5 kg)    Physical Exam  Results for orders placed or performed in visit on 03/10/24  POCT HgB A1C   Collection Time: 03/10/24  3:25 PM  Result Value Ref Range   Hemoglobin A1C 6.4 (A) 4.0 - 5.6 %   HbA1c POC (<> result, manual entry)     HbA1c, POC (prediabetic range)     HbA1c, POC (controlled diabetic range)    CBC with Differential/Platelet   Collection Time: 03/10/24  4:06 PM  Result Value Ref Range   WBC 11.2 (H) 3.8 - 10.8 Thousand/uL   RBC 6.21 (H) 4.20 - 5.80 Million/uL   Hemoglobin 17.2 (H) 13.2 - 17.1 g/dL   HCT 46.0 (H) 60.5 - 48.8 %   MCV 86.8 81.4 - 101.7 fL   MCH 27.7 27.0 - 33.0 pg   MCHC 31.9 31.6 - 35.4 g/dL   RDW 85.1 88.9 - 84.9 %   Platelets 158 140 - 400 Thousand/uL   MPV 11.8 7.5 - 12.5 fL   Neutro Abs 8,299 (H) 1,500 - 7,800 cells/uL   Absolute Lymphocytes 2,050 850 - 3,900 cells/uL   Absolute Monocytes 683 200 - 950 cells/uL   Eosinophils Absolute 112 15 - 500 cells/uL   Basophils Absolute 56 0 - 200 cells/uL   Neutrophils Relative % 74.1 %   Total Lymphocyte 18.3 %   Monocytes Relative 6.1 %   Eosinophils Relative 1.0 %   Basophils Relative 0.5 %      Assessment & Plan:   Problem List Items Addressed This Visit     Gastroesophageal reflux disease   Relevant Medications   omeprazole  (PRILOSEC) 40 MG capsule   Hyperlipidemia  associated with type 2 diabetes mellitus (HCC)   Interstitial lung disease (HCC)   Pulmonary hypertension (HCC)   Relevant Orders   Basic metabolic panel with GFR   Type 2 diabetes mellitus with other specified complication (HCC) - Primary   Relevant Orders   POCT HgB A1C (Completed)  Basic metabolic panel with GFR   Other Visit Diagnoses       Chronic respiratory failure with hypoxia (HCC)         Combined pulmonary fibrosis and emphysema (CPFE) (HCC)         Long term current use of oral hypoglycemic drug         Total bilirubin, elevated       Relevant Orders   Bilirubin, fractionated(tot/dir/indir)     Thrombocytopenia       Relevant Orders   CBC with Differential/Platelet (Completed)     Microalbuminuria       Relevant Orders   Urinalysis, Routine w reflex microscopic     Dysuria       Relevant Orders   Urinalysis, Routine w reflex microscopic   Urine Culture        Type 2 diabetes mellitus with other specified complication Diabetes well-controlled with A1c of 6.4. Jardiance  managing blood sugar and reducing fluid retention. Possible urinary odor and bubbly urine due to Jardiance . - Continue Jardiance  10 mg daily. - Monitor blood sugar levels and urinary symptoms. - Ordered urinalysis for infection and proteinuria. - Ordered basic metabolic panel per cardiologist.  Hematuria and urinary frequency Persistent hematuria and urinary frequency. Urinalysis to rule out infection and assess proteinuria. - Ordered urinalysis for infection and proteinuria.  Chronic constipation Managed with Miralax-equivalent. Miralax safe for long-term use. - Continue Miralax-equivalent daily as needed. - Discontinued over-the-counter stool softener pills.  Pulmonary hypertension Jardiance  effective in managing fluid retention. - Continue current management with Jardiance .  Interstitial lung disease Contributing to breathing difficulties. Follow-up with pulmonologist scheduled. -  Continue follow-up with pulmonologist in February.  Stasis dermatitis of lower extremity Stasis dermatitis with hyperpigmentation due to fluid retention. - Continue current management with Jardiance .  Thrombocytopenia Previously noted low platelet count. - Ordered blood test to recheck platelet count.  Gastroesophageal reflux disease Managed with omeprazole . Prescription renewal needed. - Renewed omeprazole  prescription.  Total bilirubin, elevated Previously noted elevated bilirubin levels. - Ordered blood test to recheck bilirubin levels.  General health maintenance Routine health maintenance discussed. - Scheduled follow-up appointment in six months, preferably in the afternoon.        Orders Placed This Encounter  Procedures   Urine Culture   Basic metabolic panel with GFR   Urinalysis, Routine w reflex microscopic   Bilirubin, fractionated(tot/dir/indir)   CBC with Differential/Platelet   POCT HgB A1C    Meds ordered this encounter  Medications   omeprazole  (PRILOSEC) 40 MG capsule    Sig: Take 1 capsule (40 mg total) by mouth daily before breakfast.    Dispense:  90 capsule    Refill:  3    Add refills    Follow up plan: Return in about 6 months (around 09/07/2024) for 6 month DM A1c, Heart/Pulm, updates.   Marsa Officer, DO [facility] 03/10/2024, 3:37 PM       [1] Social History Tobacco Use   Smoking status: Former    Current packs/day: 0.00    Average packs/day: 1 pack/day for 33.0 years (33.0 ttl pk-yrs)    Types: Cigarettes    Start date: 09/07/1975    Quit date: 09/06/2008    Years since quitting: 15.5    Passive exposure: Past   Smokeless tobacco: Former  Building Services Engineer status: Never Used  Substance Use Topics   Alcohol use: No   Drug use: No  "

## 2024-03-12 ENCOUNTER — Other Ambulatory Visit: Payer: Self-pay | Admitting: Family Medicine

## 2024-03-12 ENCOUNTER — Ambulatory Visit: Payer: Self-pay | Admitting: Family Medicine

## 2024-03-12 DIAGNOSIS — E875 Hyperkalemia: Secondary | ICD-10-CM

## 2024-03-12 MED ORDER — LOKELMA 10 G PO PACK: 10.0000 g | PACK | Freq: Three times a day (TID) | ORAL | 0 refills | Status: AC

## 2024-03-16 ENCOUNTER — Other Ambulatory Visit: Payer: Self-pay

## 2024-03-16 DIAGNOSIS — E1169 Type 2 diabetes mellitus with other specified complication: Secondary | ICD-10-CM

## 2024-03-16 DIAGNOSIS — E875 Hyperkalemia: Secondary | ICD-10-CM

## 2024-03-24 ENCOUNTER — Encounter

## 2024-03-29 ENCOUNTER — Ambulatory Visit: Admitting: Cardiology

## 2024-03-31 ENCOUNTER — Ambulatory Visit: Admitting: Pulmonary Disease

## 2024-04-01 ENCOUNTER — Ambulatory Visit: Admitting: Pulmonary Disease

## 2024-09-07 ENCOUNTER — Ambulatory Visit: Admitting: Family Medicine
